# Patient Record
Sex: Female | Born: 1940
Health system: Southern US, Community
[De-identification: ages and names within clinical notes are randomized; demographics above are authoritative.]

## PROBLEM LIST (undated history)

## (undated) ENCOUNTER — Emergency Department (HOSPITAL_COMMUNITY): Payer: Medicare Other | Source: Home / Self Care

## (undated) DIAGNOSIS — J189 Pneumonia, unspecified organism: Secondary | ICD-10-CM

## (undated) DIAGNOSIS — M8580 Other specified disorders of bone density and structure, unspecified site: Secondary | ICD-10-CM

## (undated) DIAGNOSIS — C50919 Malignant neoplasm of unspecified site of unspecified female breast: Secondary | ICD-10-CM

## (undated) DIAGNOSIS — Z87898 Personal history of other specified conditions: Secondary | ICD-10-CM

## (undated) DIAGNOSIS — E785 Hyperlipidemia, unspecified: Secondary | ICD-10-CM

## (undated) DIAGNOSIS — IMO0002 Reserved for concepts with insufficient information to code with codable children: Secondary | ICD-10-CM

## (undated) DIAGNOSIS — K219 Gastro-esophageal reflux disease without esophagitis: Secondary | ICD-10-CM

## (undated) DIAGNOSIS — F419 Anxiety disorder, unspecified: Secondary | ICD-10-CM

## (undated) DIAGNOSIS — I712 Thoracic aortic aneurysm, without rupture, unspecified: Secondary | ICD-10-CM

## (undated) DIAGNOSIS — IMO0001 Reserved for inherently not codable concepts without codable children: Secondary | ICD-10-CM

## (undated) DIAGNOSIS — I10 Essential (primary) hypertension: Secondary | ICD-10-CM

## (undated) DIAGNOSIS — J40 Bronchitis, not specified as acute or chronic: Secondary | ICD-10-CM

## (undated) DIAGNOSIS — F32A Depression, unspecified: Secondary | ICD-10-CM

## (undated) DIAGNOSIS — M1990 Unspecified osteoarthritis, unspecified site: Secondary | ICD-10-CM

## (undated) DIAGNOSIS — F329 Major depressive disorder, single episode, unspecified: Secondary | ICD-10-CM

## (undated) DIAGNOSIS — M810 Age-related osteoporosis without current pathological fracture: Secondary | ICD-10-CM

## (undated) DIAGNOSIS — E7800 Pure hypercholesterolemia, unspecified: Secondary | ICD-10-CM

## (undated) DIAGNOSIS — I2510 Atherosclerotic heart disease of native coronary artery without angina pectoris: Secondary | ICD-10-CM

## (undated) DIAGNOSIS — E010 Iodine-deficiency related diffuse (endemic) goiter: Secondary | ICD-10-CM

## (undated) HISTORY — DX: Hyperlipidemia, unspecified: E78.5

## (undated) HISTORY — DX: Other specified disorders of bone density and structure, unspecified site: M85.80

## (undated) HISTORY — PX: ABDOMINAL HYSTERECTOMY: SHX81

## (undated) HISTORY — PX: CATARACT EXTRACTION: SUR2

## (undated) HISTORY — DX: Age-related osteoporosis without current pathological fracture: M81.0

## (undated) HISTORY — PX: BREAST BIOPSY: SHX20

## (undated) HISTORY — DX: Personal history of other specified conditions: Z87.898

## (undated) HISTORY — PX: CERVICAL SPINE SURGERY: SHX589

---

## 1997-08-16 ENCOUNTER — Emergency Department (HOSPITAL_COMMUNITY): Admission: EM | Admit: 1997-08-16 | Discharge: 1997-08-16 | Payer: Self-pay | Admitting: Emergency Medicine

## 1997-12-29 ENCOUNTER — Other Ambulatory Visit: Admission: RE | Admit: 1997-12-29 | Discharge: 1997-12-29 | Payer: Self-pay | Admitting: Radiology

## 1998-01-10 ENCOUNTER — Other Ambulatory Visit: Admission: RE | Admit: 1998-01-10 | Discharge: 1998-01-10 | Payer: Self-pay | Admitting: Radiology

## 1998-05-14 HISTORY — PX: FRACTURE SURGERY: SHX138

## 1998-07-14 ENCOUNTER — Other Ambulatory Visit: Admission: RE | Admit: 1998-07-14 | Discharge: 1998-07-14 | Payer: Self-pay | Admitting: Obstetrics and Gynecology

## 1999-06-15 HISTORY — PX: CARDIAC CATHETERIZATION: SHX172

## 1999-07-10 ENCOUNTER — Ambulatory Visit (HOSPITAL_COMMUNITY): Admission: RE | Admit: 1999-07-10 | Discharge: 1999-07-10 | Payer: Self-pay | Admitting: Cardiology

## 1999-07-20 ENCOUNTER — Encounter: Admission: RE | Admit: 1999-07-20 | Discharge: 1999-07-20 | Payer: Self-pay | Admitting: Cardiology

## 1999-07-20 ENCOUNTER — Encounter: Payer: Self-pay | Admitting: Cardiology

## 1999-12-06 ENCOUNTER — Other Ambulatory Visit: Admission: RE | Admit: 1999-12-06 | Discharge: 1999-12-06 | Payer: Self-pay | Admitting: Obstetrics and Gynecology

## 2000-01-04 ENCOUNTER — Encounter: Payer: Self-pay | Admitting: Emergency Medicine

## 2000-01-04 ENCOUNTER — Inpatient Hospital Stay (HOSPITAL_COMMUNITY): Admission: EM | Admit: 2000-01-04 | Discharge: 2000-01-07 | Payer: Self-pay | Admitting: Emergency Medicine

## 2000-01-05 ENCOUNTER — Encounter: Payer: Self-pay | Admitting: Orthopaedic Surgery

## 2000-05-14 DIAGNOSIS — C50919 Malignant neoplasm of unspecified site of unspecified female breast: Secondary | ICD-10-CM

## 2000-05-14 HISTORY — DX: Malignant neoplasm of unspecified site of unspecified female breast: C50.919

## 2000-05-15 ENCOUNTER — Encounter: Payer: Self-pay | Admitting: Orthopedic Surgery

## 2000-05-15 ENCOUNTER — Encounter: Admission: RE | Admit: 2000-05-15 | Discharge: 2000-05-15 | Payer: Self-pay | Admitting: Orthopedic Surgery

## 2000-12-16 ENCOUNTER — Other Ambulatory Visit: Admission: RE | Admit: 2000-12-16 | Discharge: 2000-12-16 | Payer: Self-pay | Admitting: Obstetrics and Gynecology

## 2001-01-16 ENCOUNTER — Ambulatory Visit (HOSPITAL_COMMUNITY): Admission: RE | Admit: 2001-01-16 | Discharge: 2001-01-16 | Payer: Self-pay | Admitting: Cardiology

## 2001-01-16 ENCOUNTER — Encounter: Payer: Self-pay | Admitting: Cardiology

## 2001-09-12 ENCOUNTER — Emergency Department (HOSPITAL_COMMUNITY): Admission: EM | Admit: 2001-09-12 | Discharge: 2001-09-12 | Payer: Self-pay | Admitting: Emergency Medicine

## 2003-03-25 ENCOUNTER — Ambulatory Visit (HOSPITAL_COMMUNITY): Admission: RE | Admit: 2003-03-25 | Discharge: 2003-03-25 | Payer: Self-pay | Admitting: Gastroenterology

## 2004-01-14 ENCOUNTER — Ambulatory Visit (HOSPITAL_COMMUNITY): Admission: RE | Admit: 2004-01-14 | Discharge: 2004-01-14 | Payer: Self-pay | Admitting: Neurology

## 2004-09-11 DIAGNOSIS — Z87898 Personal history of other specified conditions: Secondary | ICD-10-CM

## 2004-09-11 HISTORY — DX: Personal history of other specified conditions: Z87.898

## 2004-10-06 ENCOUNTER — Encounter: Admission: RE | Admit: 2004-10-06 | Discharge: 2004-10-06 | Payer: Self-pay | Admitting: Family Medicine

## 2006-03-14 DIAGNOSIS — M810 Age-related osteoporosis without current pathological fracture: Secondary | ICD-10-CM

## 2006-03-14 HISTORY — DX: Age-related osteoporosis without current pathological fracture: M81.0

## 2008-03-02 ENCOUNTER — Observation Stay (HOSPITAL_COMMUNITY): Admission: EM | Admit: 2008-03-02 | Discharge: 2008-03-03 | Payer: Self-pay | Admitting: Emergency Medicine

## 2008-08-14 ENCOUNTER — Ambulatory Visit: Payer: Self-pay | Admitting: Diagnostic Radiology

## 2008-08-14 ENCOUNTER — Emergency Department (HOSPITAL_BASED_OUTPATIENT_CLINIC_OR_DEPARTMENT_OTHER): Admission: EM | Admit: 2008-08-14 | Discharge: 2008-08-14 | Payer: Self-pay | Admitting: Emergency Medicine

## 2009-07-19 ENCOUNTER — Encounter: Admission: RE | Admit: 2009-07-19 | Discharge: 2009-07-19 | Payer: Self-pay | Admitting: Family Medicine

## 2009-08-23 ENCOUNTER — Encounter: Admission: RE | Admit: 2009-08-23 | Discharge: 2009-08-23 | Payer: Self-pay | Admitting: Family Medicine

## 2010-04-08 ENCOUNTER — Encounter: Admission: RE | Admit: 2010-04-08 | Discharge: 2010-04-08 | Payer: Self-pay | Admitting: Interventional Cardiology

## 2010-09-26 NOTE — Consult Note (Signed)
NAMEMarland Kitchen  ORENA, CAVAZOS NO.:  [id number]   MEDICAL RECORD NO.:  [id number]          PATIENT TYPE:  OBV   LOCATION:  3702                         FACILITY:  [facility]   PHYSICIAN:  Jake Bathe, MD      DATE OF BIRTH:  01/23/1941   DATE OF CONSULTATION:  03/03/2008  DATE OF DISCHARGE:  03/03/2008                                 CONSULTATION   REQUESTING PHYSICIAN:  Ramiro Harvest, MD   PRIMARY CARDIOLOGIST:  Francisca December, MD   PRIMARY CARE PHYSICIAN:  Carola J. Gerri Spore, MD   REASON FOR CONSULTATION:  Ms. Stouffer is being seen at the request of  Dr. Janee Morn for the evaluation of chest pain.   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  The first type of chest discomfort was a reflux or  burning like pain, especially after eating, which was relieved earlier  this hospitalization with a GI cocktail.  The second type of chest  discomfort was a prickly type of sensation just above the xiphoid  process radiating to her back, which was rated as a 3-4/10 in intensity,  lasting a few minutes in duration.  This pain was nonexertional.  She is  able to traverse the stairs at her office without difficulty, without  any significant shortness of breath.   Given her history of ascending aortic aneurysm, a CT scan was performed,  which demonstrated no evidence of dissection.  Currently, she is chest  pain free.  She was given a nitroglycerin last night, which decreased  her blood pressure into the 70s and 80s and fluid resuscitation brought  her blood pressures back to normotensive range.   PAST MEDICAL HISTORY:  1. Thoracic ascending aortic aneurysm  increased from 4.0 cm to 4.3 x      4.2 cm on recent CTA, 4.0 cm was back in 2001.  2. Hypertension.  3. Anxiety.  4. GERD.   ALLERGIES:  No known drug allergies.   MEDICATIONS:  1. Aspirin 81 mg once a day.  2. Lopressor 12.5 mg twice a day.  3. Protonix 40 mg twice a day.  4. Maxzide once a day.   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  She has a son with a  cardiomyopathy and has defibrillator.   FAMILY HISTORY:  Her mother had cancer, but no early family history of  coronary artery disease.   REVIEW OF SYSTEMS:  She did have some mild nausea, but denies syncope,  bleeding, orthopnea, PND, or shortness of breath.  As stated above, all  other 12 review of systems negative.  She does have occasional chronic  lateral ankle edema, worse with age.   PHYSICAL EXAMINATION:  VITAL SIGNS:  Temperature  98, pulse 66,  respirations 16, blood pressure 99/64, and sating 98% on room air.  GENERAL:  Alert and oriented x3, in no acute distress.  Pleasant and  sitting in bed comfortable.  EYES:  Well-perfused conjunctivae.  EOMI.  No scleral icterus.  NECK:  Supple.  No lymphadenopathy.  No carotid bruits appreciated.  Normal carotid upstrokes.  CARDIOVASCULAR:  Regular rate and rhythm with no murmurs, rubs, or  gallops.  I do not appreciate a diastolic murmur.  Normal PMI.  LUNGS:  Clear to auscultation bilaterally.  No wheezes.  No rales.  Normal respiratory effort.  ABDOMEN:  Soft and nontender.  Normoactive bowel sounds.  No rebound.  No guarding.  EXTREMITIES:  Lateral ankle edema noted bilaterally, otherwise normal  distal pulses.  Normal capillary refill time.  SKIN:  Warm, dry, and intact.  No rashes present.  NEUROLOGIC:  Nonfocal.  No tremors.  PSYCH:  Normal affect.   DIAGNOSTIC DATA:  An ECG obtained shows sinus rhythm, rate 60 with no  other abnormalities.  Prior ECG from this hospitalization  shows no  significant change.  Chest CT personally viewed shows no evidence of  dissection of the aorta or pulmonary embolism.  She has aneurysmal  dilatation of ascending thoracic aorta up to 4.3 x 4.2 cm in diameter.  She also has a right thyroid nodule 2.1 cm in size containing minimal  calcification, which was recommended for non-emergent thyroid  sonography.  She has some compression deformity of the lower thoracic  vertebra.   LABORATORIES:  White count 6.8, hemoglobin 14.3, hematocrit 41.9, and  platelets 259.  Sodium 140, potassium 3.9, BUN 13, creatinine 0.8, and  glucose 96.  Cardiac biomarkers x3 are normal.   ASSESSMENT AND PLAN:  A 70 year old female with chest pain, thoracic  aortic aneurysm, hypertension, and gastroesophageal reflux disease.  1. Chest pain - reassuring cardiac biomarkers and ECG.  No evidence of      acute coronary syndrome.  Chest discomfort as described above in      HPI, is quite atypical; however, she does have evidence of moderate      coronary artery disease from prior cardiac catheterization in 2001.      Given the fact that she is currently at low risk, given no evidence      of ECG or cardiac biomarker changes, she will be obtaining a stress      Cardiolite at 8:30 in the morning on March 05, 2008, at [facility]      [facility]iology.  It may not be unreasonable also to obtain an      echocardiogram to ensure that there is no significant changes at a      late.  Nevertheless, we will continue her aspirin, beta-blocker,      and Maxzide for hypertension control.  2. Hypertension - as above.  Continue current meds.  3. Anxiety - stable.  4. Thoracic ascending aortic aneurysm - 4.3 cm at its maximum      diameter.  I will continue to monitor this.  She does not meet      criteria at this point for repair.      Jake Bathe, MD  Electronically Signed     MCS/MEDQ  D:  03/03/2008  T:  03/04/2008  Job:  [id number]   cc:   Ramiro Harvest, MD  Francisca December, M.D.

## 2010-09-26 NOTE — Discharge Summary (Signed)
NAMEMarland Kane  JUANNA, PUDLO NO.:  [id number]   MEDICAL RECORD NO.:  [id number]          PATIENT TYPE:  OBV   LOCATION:  3702                         FACILITY:  [facility]   PHYSICIAN:  Ramiro Harvest, MD    DATE OF BIRTH:  01/08/1941   DATE OF ADMISSION:  03/02/2008  DATE OF DISCHARGE:  03/03/2008                               DISCHARGE SUMMARY   PRIMARY CARE PHYSICIAN:  Carola J. Gerri Spore, MD, of [facility]   CARDIOLOGIST:  Francisca December, MD, of [facility]   DISCHARGE DIAGNOSES:  1. Atypical chest pain, likely secondary to gastroesophageal reflux      disease.  2. Gastroesophageal reflux disease.  3. Thoracic aortic aneurysm.  4. Hypertension.  5. Nonobstructive coronary artery disease per catheterization of 2001.  6. Anxiety.  7. Hyperlipidemia.  8. Incidental thyroid nodule.   DISCHARGE MEDICATIONS:  1. Aspirin 81 mg p.o. daily.  2. Alprazolam 0.5 mg p.o. p.r.n.  3. Calcium D daily.  4. Metoprolol--continue at previous home dose, dose unknown on      admission.  5. Triamterene/hydrochlorothiazide 37.5/25 mg p.o. daily.  Resume in 3      days postdischarge.  Start on March 06, 2008.  6. Protonix 40 mg 1 tab p.o. b.i.d.   DISPOSITION AND FOLLOWUP:  The patient will be discharged home.  The  patient is to follow up with Dr. Amil Amen with [facility] for a  Cardiolite stress test on March 05, 2008, at 8:30 a.m.  The patient is  also to follow up with PCP in 2 weeks to reassess patient's GERD, also  reassess patient's blood pressure.  Patient will need a fasting lipid  panel to reassess her hyperlipidemia.  Patient will also need a thyroid  sonography to follow up on the nodule that was incidentally found on  imaging during this hospitalization.  Patient will be called by Dr.  Adele Dan office of [facility] of the vascular section to have patient be seen  to follow up on her thoracic aneurysm.   PROCEDURES PERFORMED:  1. A chest x-ray was  performed on March 02, 2008, that showed no      active disease.  2. An abdominal ultrasound was performed on March 02, 2008, that      showed abdominal aorta and iliac arteries normal, normal kidney      size without obstruction.  3. CT angiography of the chest was done March 02, 2008, that showed      no evidence of aortic dissection or pulmonary embolism, aneurysmal      dilatation of the ascending thoracic aorta up to 4.3 x 4.2 cm      diameter, right thyroid nodule 2.1 cm greatest size containing      minimal calcification; recommend followup non-emergent thyroid      sonography to evaluate, old-appearing compression deformity of a      lower thoracic vertebra.   CONSULTATIONS:  Patient was seen in consultation by Dr. Donato Schultz of  [facility] on March 03, 2008.   BRIEF HOSPITAL ADMISSION HISTORY AND PHYSICAL:  Mrs. Tiffany Kane is  a 70 year old white  female with past medical history of enlarged  ascending thoracic aortic aneurysm last followed in 2001 at 3.9 cm.  Patient also has a history of hypertension, had been doing well, but  over the past week, she had been complaining of some very atypical chest  pain.  Patient describes it as midsternal, more of a burning sensation,  has been taking Tums and Rolaids which she states have given some  partial relief.  Patient has also had some nausea and vomiting with this  but no associated shortness of breath.  Patient became concerned when  symptoms were not improving and came to the ED for further evaluation.  In the emergency room, EKG done noted a normal sinus rhythm.  Cardiac  markers and chest x-ray were unremarkable.  Initially, she had told the  physician she had a history of an abdominal aortic aneurysm, noted  thoracic aneurysm, and as such, an ultrasound of abdomen was ordered  which was completely normal.  Patient herself is currently doing well.  Patient denied any headaches, no visual changes, no  dysphagia, no chest  pain, no palpitations, no shortness of breath, no wheeze, no cough, no  abdominal pain, no hematuria, no dysuria, no constipation, no diarrhea,  no focal extremity numbness, weakness or pain.   PHYSICAL EXAMINATION:  By admitting physician.  VITAL SIGNS:  Temperature 98.2, pulse of 78, blood pressure 166/95 down  to 136/75, respirations 12, O2 saturations of 98% on room air.  GENERAL:  Patient is alert and oriented x3, in no apparent distress.  HEENT:  Normocephalic, atraumatic.  Mucous membranes are moist, no  carotid bruits.  CARDIOVASCULAR:  Regular rate and rhythm, S1, S2, no murmurs, rubs, or  gallops.  RESPIRATIONS:  Lungs are clear to auscultation bilaterally.  ABDOMEN:  Soft, nontender, nondistended, positive bowel sounds.  EXTREMITIES:  No clubbing, cyanosis or edema.   LABORATORIES ON ADMISSION:  CBC:  White count 6.8, hemoglobin 14.3,  hematocrit 41.9, platelets of 259, ANC of 3.7.  Point-of-care cardiac  markers:  CK-MB 1.6, troponin-I less than 0.05, myoglobin of 90.6, i-  STAT 8 with a sodium of 140, potassium 3.9, chloride 106, glucose 96,  BUN 13, creatinine 0.8, hemoglobin and hematocrit of 14.3/42  respectively, ionized calcium of 1.19.  Chest x-ray as stated above with  no active disease.  EKG with normal sinus rhythm.  Abdominal ultrasound  is as stated above.   HOSPITAL COURSE:  1. Atypical chest pain.  The patient was admitted for chest pain, rule      out myocardial infarction.  Patient's atypical chest pain was felt      to be likely secondary to gastroesophageal reflux disease.  Due to      patient's multiple cardiac issues of hyperlipidemia, hypertension,      family history of coronary artery disease, history of single-vessel      nonobstructing coronary artery disease, it was felt patient be      brought in and ruled out for MI.  Cardiac enzymes were cycled which      came back negative.  EKG was without any changes.  Patient       described her atypical chest pain as more burning in sensation with      some associated brash.  Patient was placed on Protonix 40 mg daily,      was also given a GI cocktail with some mild relief in her symptoms.      During her initial hospitalization during the night  of March 02, 2008, patient did have some more chest pain and was given some      nitroglycerin with a drop in her systolic blood pressure.  This was      thought to be secondary to her nitroglycerin.  Patient did not have      any relief with the nitroglycerin.  Patient's blood pressure      medications were held, and patient was fluid resuscitated; patient      remained in stable and improved condition.  Cardiology was      consulted.  Patient was seen in consultation by Dr. Donato Schultz of      [facility] who felt patient's chest pain was atypical, felt      patient was at low risk and felt that patient would benefit from      outpatient Cardiolite which is scheduled for March 05, 2008, at      8:30 a.m.  Patient remained stable throughout the hospitalization,      and patient will be discharged in stable and improved condition.  2. Thoracic aortic aneurysm as noted per CT.  This did have increased      diameter of 4.3 x 4.2 as compared to 3.9 in 2001.  Patient remained      stable.  There was no evidence of dissection per CT angio.  I      discussed patient's thoracic aneurysm with Dr. Edilia Bo of [facility],      vascular surgery section.  Will have his office call patient for an      appointment to follow up on her thoracic aortic aneurysm.  3. Thyroid nodule.  Thyroid nodule was noted incidentally on CT      angiogram of the chest.  This will need to be followed up as an      outpatient.  Patient remained asymptomatic throughout the      hospitalization.  The rest of patient's chronic medical issues      remained stable throughout the hospitalization, and patient will be      discharged in stable and improved  condition.   On day of discharge, vital signs revealed temperature of 97.5, pulse of  80, blood pressure 123/76, respirations 18, saturating 100% on room air.   It was a pleasure taking care of Mrs. Charm Barges.      Ramiro Harvest, MD  Electronically Signed     DT/MEDQ  D:  03/03/2008  T:  03/03/2008  Job:  [id number]   cc:   Otilio Connors. Gerri Spore, M.D.  Francisca December, M.D.  Jake Bathe, MD  Di Kindle. Edilia Bo, M.D.

## 2010-09-26 NOTE — H&P (Signed)
Tiffany Kane, Tiffany Kane NO.:  [id number]   MEDICAL RECORD NO.:  [id number]          PATIENT TYPE:  EMS   LOCATION:  MAJO                         FACILITY:  [facility]   PHYSICIAN:  Hollice Espy, M.D.DATE OF BIRTH:  08-13-1940   DATE OF ADMISSION:  03/02/2008  DATE OF DISCHARGE:                              HISTORY & PHYSICAL   PRIMARY CARE Drelyn Pistilli:  Dr. Carolin Coy.   CHIEF COMPLAINT:  Chest pain.   HISTORY OF PRESENT ILLNESS:  The patient is a 70 year old white female  with a past medical history of enlarged ascending thoracic aortic  aneurysm last followed in 2001 at 3.9 cm.  She also has a history of  hypertension.  She has been doing well, but over the past week, she is  complaining of some very atypical chest pain.  She describes it as  midsternal, more of a burning, and she has been taking Tums and Rolaids,  which she says has given her some partial relief.  She has also had some  nausea and vomiting with this, but no associated shortness of breath.  She became concerned when symptoms were not improving, and came to the  emergency room for further evaluation.  In the emergency room, her EKG  noted a normal sinus rhythm.  Her cardiac markers and chest x-ray were  unremarkable.  Initially, she had told the physician she had a history  of an AAA, and not a thoracic aneurysm, and they ordered an ultrasound  of her abdomen, which was completely normal.  The patient, herself, is  currently doing well.  She denies any headaches, vision changes,  dysphagia, chest pain, palpitations, shortness of breath, wheeze, cough,  abdominal pain, hematuria, dysuria, constipation, diarrhea, focal  extremity numbness, weakness, or pain.   REVIEW OF SYSTEMS:  Otherwise, negative.   PAST MEDICAL HISTORY:  Includes hypertension, anxiety, and enlarged  thoracic aortic aneurysm.   MEDICATIONS:  1. The patient on metoprolol.  She cannot remember the dose, but she  takes a low dose daily.  2. She is on triamterine 37.5/hydrochlorothiazide 25 p.o. daily.  3. She also takes aspirin 81 p.o. daily.   She has no known drug allergies.   SOCIAL HISTORY:  Denies any tobacco, alcohol, or drug use.   FAMILY HISTORY:  Noncontributory.   PHYSICAL EXAM:  VITAL SIGNS:  Temperature 98.2, heart rate 78, blood  pressure initially 166/95, now down to 136/75, respirations 12, O2  saturation 98% on room air.  GENERAL:  She is alert and oriented x3.  In no apparent distress.  HEENT:  Normocephalic, atraumatic.  Mucous membranes are moist.  She has  no carotid bruits.  HEART:  Regular rate and rhythm.  S1, S2.  No murmurs.  LUNGS:  Clear to auscultation bilaterally.  ABDOMEN:  Soft, nontender, nondistended.  Positive bowel sounds.  EXTREMITIES:  No cyanosis, clubbing, or edema.   LAB WORK:  CBC 90.6, MB 1.6, troponin I less than 0.05, white count 6.8,  H and H 14.3 and 42, MCV 90, platelet count 259,000.  No shift.  Sodium  140, potassium 3.9, chloride 106, BUN  13, creatinine 0.8, glucose 96.  Ultrasound of the aorta, abdominal aorta, and iliac arteries normal.  Normal kidney size without obstruction.  Chest x-ray shows no active  disease.  EKG shows normal sinus rhythm.   ASSESSMENT AND PLAN:  1. Chest pain, atypical, likely gastroesophageal reflux disease, but      will check enzymes x3.  If they are negative, we will plan to      discharge home with followup with [facility] as an outpatient      for outpatient stress test.  2. History of dilated thoracic aortic aneurysm.  Check a CT scan of      the chest with contrast.  3. Hypertension.  Continue meds.      Hollice Espy, M.D.  Electronically Signed     SKK/MEDQ  D:  03/02/2008  T:  03/02/2008  Job:  [id number]   cc:   Otilio Connors. Gerri Spore, M.D.

## 2010-09-29 NOTE — Op Note (Signed)
   NAME:  Tiffany Kane, Tiffany Kane                      ACCOUNT NO.:  [id number]   MEDICAL RECORD NO.:  [id number]                   PATIENT TYPE:  AMB   LOCATION:  ENDO                                 FACILITY:  [facility]   PHYSICIAN:  Graylin Shiver, M.D.                DATE OF BIRTH:  [birth year]   DATE OF PROCEDURE:  03/25/2003  DATE OF DISCHARGE:                                 OPERATIVE REPORT   PROCEDURE:  Colonoscopy.   INDICATIONS FOR PROCEDURE:  Rectal bleeding.   PROCEDURE:  Informed consent was obtained after explanation of ithe risks of  bleeding, infection and perforation.   PREMEDICATION:  Fentanyl 100 mcg IV and Versed 10 mg IV.   With the patient in the left lateral decubitus position a rectal exam was  performed and no masses were felt.  The Olympus colonoscope was inserted  into the rectum and advanced around the colon to the cecum.  The cecal  landmarks were identified.  The cecum and ascending colon were normal.  The  transverse colon was normal.  The descending colon and sigmoid revealed some  scattered diverticula.  The rectum was normal.  She tolerated the procedure  well without complications.   IMPRESSION:  Diverticulosis of the left colon.                                               Graylin Shiver, M.D.    SFG/MEDQ  D:  03/25/2003  T:  03/25/2003  Job:  [id number]   cc:   Otilio Connors. Gerri Spore, M.D.  [address]  [place]  [place] [address]  Fax: [phone]

## 2010-09-29 NOTE — Cardiovascular Report (Signed)
[place]  Patient:    Tiffany Kane, Tiffany Kane                     MRN: [id number] Proc. Date: 07/10/99 Adm. Date:  [id number] Attending:  Corliss Marcus CC:         Carola J. Gerri Spore, M.D.             Cardiac Catheterization Lab                        Cardiac Catheterization  CINE NO. [id number]  PROCEDURES PERFORMED: 1. Left heart catheterization. 2. Coronary angiography. 3. Left ventriculogram.  INDICATIONS:  Ms. Dinkel is a 70 year old with known nonobstructive coronary artery disease dating from a heart catheterization done in 1996.  She has recently developed an atypical anginal syndrome.  An exercise treadmill test was borderline positive for electrocardiographic changes indicative of ischemia.  No angina is  reproduced.  She is brought to the catheterization laboratory to evaluate for possible progression of her LAD stenosis versus other significant coronary disease.  PROCEDURAL NOTE:  Left heart catheterization was performed following the percutaneous insertion of a 5-French catheter sheath utilizing an anterior approach over a guiding J wire into the right femoral artery.  The patient then received  1500 units of heparin.  A 110-cm pigtail catheter was advanced to the ascending  aorta where the pressure was recorded.  The catheter was then prolapsed across he aortic valve, and the pressure was again recorded in the left ventricle both prior to and following the ventriculogram.  A 30-degree RAO cine left ventriculogram as performed using a power injector at 45 cc of Omnipaque contrast material injected at 13 cc per second.  Coronary angiography was then performed using 5-French #4  right and left Judkins catheters.  Cineangiography of each coronary artery was conducted in multiple LAO and RAO projections.  All catheter manipulations were  performed using fluoroscopic observation, and exchanges were performed over  a long guiding J wire.  At the completion of the procedure, the catheter and catheter sheaths were removed. Hemostasis was achieved by direct pressure.  The patient was transported to the  recovery area in stable condition with intact distal pulses.  Fluoroscopy time as three minutes.  Total contrast utilized was 120 cc of Omnipaque.  HEMODYNAMICS:  Systemic arterial pressure was 144/77 with a mean of 106 mmHg. he left ventricular end diastolic pressure was 12 mmHg pre-ventriculogram and unchanged post-ventriculogram.  There was no systolic gradient across the aortic valve.  ANGIOGRAPHY:  The left ventriculogram demonstrated normal left ventricular size and global systolic function without regional wall motion abnormality.  There was no mitral regurgitation.  There was left coronary calcification and aortic root calcification seen.  The calculated ejection fraction utilizing a single plane cine method was 75%.  There was a right dominant coronary system present. 1. The main left coronary artery was normal. 2. The left anterior descending artery and its branches were moderately diseased;    atherosclerosis can be seen involving the proximal and mid portion of the    vessel and achieves a maximum stenosis of 35-40% just after the first septal    perforator.  The remainder of the vessel is without significant obstruction.    There are several small diagonal branches which arise, none of which have any    significant disease. 3. The left circumflex artery and its branches were normal. 4. The right coronary artery and  its branches were normal.  Collateral vessels are not seen.  FINAL IMPRESSION: 1. Nonobstructive single-vessel ASCVD. 2. History of mildly dilated ascending aorta.  PLAN: 1. Will obtain a CT scan of the chest to assess for any possible progression in the    size of her ascending aortic aneurysm. 2. Will consider additional medical therapy for treatment of  her chest discomfort,    some of which does rarely occur with exertion. DD:  07/10/99 TD:  07/10/99 Job: [id number]

## 2010-09-29 NOTE — Op Note (Signed)
[place]. [facility]  Patient:    Tiffany Kane, Tiffany Kane                     MRN: [id number] Proc. Date: 01/04/00 Adm. Date:  [id number] Disc. Date: [id number] Attending:  Randolm Idol                           Operative Report  ASSISTANT:  Arnoldo Morale, P.A.  PREOPERATIVE DIAGNOSES: 1. Displaced right distal fibular fracture. 2. Displaced right distal tibia fracture.  POSTOPERATIVE DIAGNOSES: 1. Displaced right distal fibular fracture. 2. Displaced right distal tibia fracture.  OPERATION PERFORMED:  Open reduction and internal fixation.  ANESTHESIA:  General endotracheal.  COMPLICATIONS:  None.  DESCRIPTION OF PROCEDURE:  With the patient comfortable on the operating table under general endotracheal anesthesia, the right lower extremity was placed and applied tourniquet.  The leg was prepped with Betadine scrub and then Duraprep from the tips of the toes to both the knees.  Sterile draping was performed.  With extremity still elevated, it was Esmarch exsanguinated with the proximal tourniquet at 350 mmHg.  Preoperative films revealed a displaced, short, oblique distal fibular fracture associated with a distal tibia fracture, i.e., a pilon type, without evidence of a medial malleolus fracture.  There was an anterior/posterior translation of the distal tibia.  A longitudinal incision was made along the fibula and by sharp dissection, it was carried through the subcutaneous tissue.  Then, by blunt dissection, the soft tissue was elevated off the distal fibula.  The fracture was identified. The proximal fragment was anterior to the distal fragment and considerably shortened.  Under direct visualization, and with some difficulty, the fracture was reduced and maintained with a bone clamp.  X-rays revealed excellent position of that particular fracture in re-established tibial plane.  There was, however, a small intra-articular fragment of the lateral  distal tibia that was still displaced and this was palpable through the lateral incision.  A Synthes 7-hole one-third tubular plate was then bent to conform with the distal fibula and fixed with four proximal cortical screws and three distal cancellous screws after appropriate drilling and measuring.  Then, by blunt dissection, I elevated the soft tissue off the lateral distal tibial fragment.  It was probably 1.5 cm by 2 cm and was displaced and it did represent an intra-articular fragment.  Under direct visualization, it was reduced to maintain with a K-wire.  Image intensification revealed excellent position.  I measured the K-wire and drilled over the K-wire, inserted the Ace cannulator 3.5 screw with excellent fixation of the fragment.  There was no motion.  I checked the joint surface and felt that it was basically anatomic.  The anterior/posterior tibia fracture appeared to be stable, but to secure position, I inserted an anterior-to-posterior screw through a separate stab wound, approximately 8 cm, anterior and medial to the fibular incision.  By lunt dissection, the soft tissue was elevated off the periosteum.  Retractors were inserted.  K-wire was inserted and checked to be in good position with AP and lateral projections.  It was overdrilled and then filled with the Ace titanium cannulated screw with excellent fixation.  It was nice and tight. The fracture was again checked with intensification and it was anatomic in both AP and lateral projections.  The wounds were irrigated with saline antibiotic solution, then closed in several layers laterally and just skin clips anteriorly, as it was a very  small incision.  A sterile bulky dressing was applied, followed by posterior splint and Ace bandage.  The tourniquet was deflated with immediate capillary refill to the toes.  The patient tolerated the procedure without complications. DD:  01/05/00 TD:  01/06/00 Job:  [id number]

## 2010-09-29 NOTE — Discharge Summary (Signed)
[place]. [facility]  Patient:    Tiffany Kane, Tiffany Kane                     MRN: [id number] Adm. Date:  [id number] Disc. Date: [id number] Attending:  Randolm Idol                           Discharge Summary  FINAL DIAGNOSES: 1. Displaced right distal fibula fracture. 2. Displaced right distal tibia fracture with intra-articular extension. 3. History of hypercholesterolemia. 4. Gastroesophageal reflux disease. 5. History of cardiac catheterization with 75% ejection fraction.  PROCEDURES:  On January 04, 2000, open reduction internal fixation of right distal fibula and tibia fracture.  COMPLICATIONS:  None.  HISTORY:  This 70 year old female fell after twisting her right ankle while stepping down on a concrete step on the evening of admission.  She was brought to the emergency room, with x-rays demonstrating a displaced distal tibia fracture with intra-articular extension associated with a displaced distal fibula fracture.  She was taken to the operating room that evening, where she underwent open reduction internal fixation of both the tibia and fibula fractures.  The reduction seemed to be anatomic.  Technically, the procedure went quite well.  She was placed in a posterior splint.  Postoperative day #1, she was afebrile, awake, and alert.  She was comfortable with minimal use of analgesics.  Neurovascular exam was intact to the right foot.  Physical therapy was initiated and at the time of discharge, she was progressing to the point where she was relatively comfortable with crutches, ______ difficulty going up and down stairs.  She preferred to use a walker and this was subsequently provided.  She was discharged on postoperative day #3, tolerating a regular diet, voiding without difficulty.  She was to receive home health therapy.  She has a three-in-one chair and the walker.  She is to return to the office in a week for reevaluation and removal of  her staples.  She was tolerating a regular diet and was comfortable and fairly independent, both in and out of bed.  DISCHARGE MEDICATIONS:  Were to include her home medications of Prilosec 20 mg a day, Lipitor 10 mg a day, and Evista 60 mg a day.  Percocet 5 mg 1-2 tablets every three hours p.r.n.  LABORATORY STUDIES:  Include an EKG with normal sinus rhythm.  Chest x-ray show lungs to be clear, the heart size was within normal limits. There were surgical clips identified in the left paratracheal region.  There is no evidence of any bony abnormality.  Preoperative hemoglobin was 13.3, hematocrit 37.8.  Postoperatively was 11.9 and 35.1.  Sodium was 137, potassium 4.8, chloride 102, CO2 26, glucose 107, BUN 16, creatinine 0.7, calcium 9, total protein 6.6, albumin 4.0, AST 44, ALT 18, ALP 53, and total bilirubin was 1.1.  A urine performed on August 23 revealed moderate leukocyte esterase, rare epithelial cells, 11-20 wbcs, and 0-5 rbcs, rare bacteria.  A repeat was performed on January 05, 2000 and was essentially normal, except for a trace of leukocyte esterase, 0-5 wbcs, and 0-5 rbcs with no bacteria identified.  C&S was also performed on August 24.  There were multiple species present and it was felt that this probably was ______ and there were 25,000 colonies. DD:  03/03/00 TD:  03/04/00 Job: [id number]

## 2010-09-29 NOTE — H&P (Signed)
[place]. [facility]  Patient:    SALEM, MASTROGIOVANNI                     MRN: [id number] Adm. Date:  [id number] Attending:  Corliss Marcus Dictator:   Anselm Lis, N.P. CC:         Carola J. Gerri Spore, M.D.                         History and Physical  HISTORY OF PRESENT ILLNESS:  Ms. Heffner is a 70 year old with known single-vessel coronary artery disease (40%-60% proximal/mid-LAD by heart catheterization in September 1996), who has been experiencing nonexertional mid-substernal chest pain/pressure, which radiates through to her back.  She is status post a follow-up treadmill test (often), which was borderline electrocardiographically for ischemia. She now presents for a repeat cardiac catheterization with possible percutaneous intervention if indicated and able.  PAST MEDICAL HISTORY: 1. Single-vessel coronary artery disease:    a. In September 1996, a 40%-60% proximal to mid-LAD, intact LV size       and function.    b. (February 2001), borderline electrocardiographically office stress       treadmill. 2. 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 3. History of chest discomfort, responsive to Prilosec. 4. 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 5. Dyslipidemia, on Pravachol. 6. Hysterectomy without BSO. 7. In 1999, a lumpectomy for breast cancer, which was cured.  The patient denies a history of hypertension, diabetes mellitus, or thyroid disease.  ALLERGIES:  DEMEROL causing breathing problems, though she subsequently had Demerol with Phenergan, without problems.  CURRENT MEDICATIONS: 1. Enteric-coated aspirin 325 mg q.d. 2. Pravachol 20 mg p.o. q.d. 3. Evista once q.d. 4. Prilosec 20 mg q.d.  SOCIAL HISTORY:  HABITS:  Tobacco:  Quit six years earlier, prior a 15-year total of less or equal to one pack per day.  ETOH:  Negative.   Caffeine:  Negative. he patient works as a Psychologist, forensic.  She has been widowed approximately seven years.  She has two sons alive and well.  FAMILY HISTORY:  Mother without coronary artery disease, and is age 19.  Father  deceased at age 11 of bone cancer.  She has a brother who had a heart attack at age 13, and a sister who is alive and well.  REVIEW OF SYSTEMS:  As in the HPI and the past medical history.  Otherwise denies problems with near syncopal episodes.  Has rare episodic lightheadedness.  Rare  tinnitus.  Good dentition.  Negative dysphagia to food or fluid.  Positive symptoms of gastroesophageal reflux disease, experiences epigastric, lower substernal "hurting" radiating to her back.  Denies melena, constipation, diarrhea, or black or tarry-looking stools.  Negative dysuria or hematuria.  No arthritic-type complaints.  Negative palpitations.  Denies pedal edema, orthopnea, or PND. Negative DOE.  PHYSICAL EXAMINATION:  VITAL SIGNS:  Blood pressure 131/72, heart rate 72 and regular, respirations 14, temperature 98.3 degrees.  GENERAL:  She is a well-nourished older female, in no apparent distress.  Her mother is in attendance.  HEENT/NECK:  Brisk bilateral carotid upstroke without bruits.  No significant jugular venous distention or thyromegaly.  CHEST:  Lung sounds clear with equal bilateral excursion.  Negative CPA tenderness.  CARDIAC:  A regular rate and rhythm without murmur, rub, or gallop.  Normal S1,  S2.  ABDOMEN:  Soft, nondistended, normoactive bowel sounds.  Negative abdominal aorta, renal, or femoral bruits.  No masses, no organomegaly, nontender.  EXTREMITIES:  With +2/4 bilateral radial, femoral, and dorsalis pedis pulses. Negative pedal edema.  NEUROLOGIC:  Cranial nerves II-XII grossly intact.  Alert and oriented x 3.  GENITOURINARY:  Deferred.  RECTAL:  Deferred.  LABORATORY DATA:  From July 04, 1999, sodium 142, K of 4.1,  chloride 103, CO2 of 29, BUN 13, creatinine 0.8, glucose 88.  CBC reveals a hemoglobin of 14.5, WBC 4.7, platelets 244.  Pro time 11.8, INR 1.01, PTT 35.  Electrocardiogram reveals normal sinus rhythm at 79 beats per minute with T-wave inversion in aVL.  No acute changes.  IMPRESSION: 1. Atypical chest pain in a 70 year old with known single-vessel coronary artery    disease, moderate left anterior descending coronary artery stenosis by a    heart catheterization five years earlier.  Follow-up office treadmill was    electrocardiographically suspicious for ischemia.  She now presents for a    heart catheterization, to define the coronary anatomy. 2. Dyslipidemia, on Pravachol. 3. Dilated ascending aortic artery, stable by a CAT scan in 1998. 4. History of gastroesophageal reflux disease.  PLAN:  A cardiac catheterization with possible percutaneous intervention if indicated and able.  The risks, potential complications, benefits, and alternatives of the procedure  were discussed in detail.  Ms. Auzenne indicates that her questions and concerns have been addressed and wishes to proceed.DD:  07/10/99 TD:  07/10/99 Job: [id number]

## 2011-02-13 LAB — POCT I-STAT, CHEM 8
BUN: 13
Calcium, Ion: 1.19
Creatinine, Ser: 0.8
Hemoglobin: 14.3
Sodium: 140
TCO2: 26

## 2011-02-13 LAB — CARDIAC PANEL(CRET KIN+CKTOT+MB+TROPI)
CK, MB: 1.4
Relative Index: INVALID
Relative Index: INVALID
Troponin I: <0.01
Troponin I: <0.01

## 2011-02-13 LAB — DIFFERENTIAL
Basophils Absolute: 0
Lymphocytes Relative: 35
Monocytes Absolute: 0.4
Monocytes Relative: 6
Neutro Abs: 3.7

## 2011-02-13 LAB — CBC
MCV: 89.6
RBC: 4.68
WBC: 6.8

## 2011-02-13 LAB — POCT CARDIAC MARKERS
CKMB, poc: 1.6
Myoglobin, poc: 90.6

## 2011-04-10 ENCOUNTER — Other Ambulatory Visit: Payer: Self-pay | Admitting: Interventional Cardiology

## 2011-04-10 DIAGNOSIS — I712 Thoracic aortic aneurysm, without rupture: Secondary | ICD-10-CM

## 2011-04-17 ENCOUNTER — Ambulatory Visit
Admission: RE | Admit: 2011-04-17 | Discharge: 2011-04-17 | Disposition: A | Discharge status: Home / Self Care | Payer: Medicare Other | Source: Ambulatory Visit | Attending: Interventional Cardiology | Admitting: Interventional Cardiology

## 2011-04-17 DIAGNOSIS — I712 Thoracic aortic aneurysm, without rupture: Secondary | ICD-10-CM

## 2011-04-17 MED ORDER — GADOBENATE DIMEGLUMINE 529 MG/ML IV SOLN
14.0000 mL | Freq: Once | INTRAVENOUS | Status: AC | PRN
  Administered 2011-04-17: 14 mL via INTRAVENOUS

## 2011-04-20 ENCOUNTER — Other Ambulatory Visit: Payer: Self-pay | Admitting: Interventional Cardiology

## 2011-04-20 DIAGNOSIS — I712 Thoracic aortic aneurysm, without rupture: Secondary | ICD-10-CM

## 2011-04-27 ENCOUNTER — Other Ambulatory Visit: Payer: Self-pay | Admitting: Family Medicine

## 2011-04-27 DIAGNOSIS — E041 Nontoxic single thyroid nodule: Secondary | ICD-10-CM

## 2011-04-30 ENCOUNTER — Ambulatory Visit
Admission: RE | Admit: 2011-04-30 | Discharge: 2011-04-30 | Disposition: A | Discharge status: Home / Self Care | Payer: Medicare Other | Source: Ambulatory Visit | Attending: Family Medicine | Admitting: Family Medicine

## 2011-04-30 DIAGNOSIS — E041 Nontoxic single thyroid nodule: Secondary | ICD-10-CM

## 2011-05-03 ENCOUNTER — Other Ambulatory Visit: Payer: Self-pay | Admitting: Family Medicine

## 2011-05-03 DIAGNOSIS — E042 Nontoxic multinodular goiter: Secondary | ICD-10-CM

## 2011-05-16 ENCOUNTER — Other Ambulatory Visit (HOSPITAL_COMMUNITY)
Admission: RE | Admit: 2011-05-16 | Discharge: 2011-05-16 | Disposition: A | Discharge status: Home / Self Care | Payer: Medicare Other | Source: Ambulatory Visit | Attending: Interventional Radiology | Admitting: Interventional Radiology

## 2011-05-16 ENCOUNTER — Other Ambulatory Visit: Payer: Self-pay | Admitting: Interventional Radiology

## 2011-05-16 ENCOUNTER — Ambulatory Visit
Admission: RE | Admit: 2011-05-16 | Discharge: 2011-05-16 | Disposition: A | Discharge status: Home / Self Care | Payer: Medicare Other | Source: Ambulatory Visit | Attending: Family Medicine | Admitting: Family Medicine

## 2011-05-16 DIAGNOSIS — E049 Nontoxic goiter, unspecified: Secondary | ICD-10-CM | POA: Diagnosis not present

## 2011-05-16 DIAGNOSIS — E079 Disorder of thyroid, unspecified: Secondary | ICD-10-CM | POA: Diagnosis not present

## 2011-05-16 DIAGNOSIS — E042 Nontoxic multinodular goiter: Secondary | ICD-10-CM

## 2011-05-21 DIAGNOSIS — F411 Generalized anxiety disorder: Secondary | ICD-10-CM | POA: Diagnosis not present

## 2011-05-23 DIAGNOSIS — F411 Generalized anxiety disorder: Secondary | ICD-10-CM | POA: Diagnosis not present

## 2011-05-23 DIAGNOSIS — M25559 Pain in unspecified hip: Secondary | ICD-10-CM | POA: Diagnosis not present

## 2011-05-23 DIAGNOSIS — Z79899 Other long term (current) drug therapy: Secondary | ICD-10-CM | POA: Diagnosis not present

## 2011-05-23 DIAGNOSIS — E782 Mixed hyperlipidemia: Secondary | ICD-10-CM | POA: Diagnosis not present

## 2011-05-30 DIAGNOSIS — M545 Low back pain: Secondary | ICD-10-CM | POA: Diagnosis not present

## 2011-06-06 DIAGNOSIS — M545 Low back pain: Secondary | ICD-10-CM | POA: Diagnosis not present

## 2011-06-12 DIAGNOSIS — M545 Low back pain: Secondary | ICD-10-CM | POA: Diagnosis not present

## 2011-06-14 DIAGNOSIS — M545 Low back pain: Secondary | ICD-10-CM | POA: Diagnosis not present

## 2011-06-19 DIAGNOSIS — M545 Low back pain: Secondary | ICD-10-CM | POA: Diagnosis not present

## 2011-06-21 DIAGNOSIS — M545 Low back pain: Secondary | ICD-10-CM | POA: Diagnosis not present

## 2011-06-27 DIAGNOSIS — M545 Low back pain: Secondary | ICD-10-CM | POA: Diagnosis not present

## 2011-07-05 DIAGNOSIS — M545 Low back pain: Secondary | ICD-10-CM | POA: Diagnosis not present

## 2011-07-12 DIAGNOSIS — M545 Low back pain: Secondary | ICD-10-CM | POA: Diagnosis not present

## 2011-07-17 DIAGNOSIS — M545 Low back pain: Secondary | ICD-10-CM | POA: Diagnosis not present

## 2011-07-25 DIAGNOSIS — M545 Low back pain: Secondary | ICD-10-CM | POA: Diagnosis not present

## 2011-08-07 DIAGNOSIS — J0100 Acute maxillary sinusitis, unspecified: Secondary | ICD-10-CM | POA: Diagnosis not present

## 2011-08-07 DIAGNOSIS — M545 Low back pain: Secondary | ICD-10-CM | POA: Diagnosis not present

## 2011-08-17 DIAGNOSIS — Z961 Presence of intraocular lens: Secondary | ICD-10-CM | POA: Diagnosis not present

## 2011-08-17 DIAGNOSIS — Z0100 Encounter for examination of eyes and vision without abnormal findings: Secondary | ICD-10-CM | POA: Diagnosis not present

## 2011-08-17 DIAGNOSIS — H259 Unspecified age-related cataract: Secondary | ICD-10-CM | POA: Diagnosis not present

## 2011-08-17 DIAGNOSIS — G909 Disorder of the autonomic nervous system, unspecified: Secondary | ICD-10-CM | POA: Diagnosis not present

## 2011-09-13 ENCOUNTER — Other Ambulatory Visit: Payer: Self-pay | Admitting: Family Medicine

## 2011-09-13 ENCOUNTER — Ambulatory Visit
Admission: RE | Admit: 2011-09-13 | Discharge: 2011-09-13 | Disposition: A | Discharge status: Home / Self Care | Payer: Medicare Other | Source: Ambulatory Visit | Attending: Family Medicine | Admitting: Family Medicine

## 2011-09-13 DIAGNOSIS — R509 Fever, unspecified: Secondary | ICD-10-CM | POA: Diagnosis not present

## 2011-09-13 DIAGNOSIS — R062 Wheezing: Secondary | ICD-10-CM | POA: Diagnosis not present

## 2011-09-13 DIAGNOSIS — J189 Pneumonia, unspecified organism: Secondary | ICD-10-CM

## 2011-09-14 ENCOUNTER — Other Ambulatory Visit: Payer: Self-pay | Admitting: Family Medicine

## 2011-09-14 DIAGNOSIS — R9389 Abnormal findings on diagnostic imaging of other specified body structures: Secondary | ICD-10-CM

## 2011-09-18 ENCOUNTER — Ambulatory Visit
Admission: RE | Admit: 2011-09-18 | Discharge: 2011-09-18 | Disposition: A | Discharge status: Home / Self Care | Payer: Medicare Other | Source: Ambulatory Visit | Attending: Family Medicine | Admitting: Family Medicine

## 2011-09-18 DIAGNOSIS — R9389 Abnormal findings on diagnostic imaging of other specified body structures: Secondary | ICD-10-CM

## 2011-09-18 DIAGNOSIS — J984 Other disorders of lung: Secondary | ICD-10-CM | POA: Diagnosis not present

## 2011-09-18 DIAGNOSIS — E049 Nontoxic goiter, unspecified: Secondary | ICD-10-CM | POA: Diagnosis not present

## 2011-09-18 MED ORDER — IOHEXOL 300 MG/ML  SOLN
75.0000 mL | Freq: Once | INTRAMUSCULAR | Status: AC | PRN
  Administered 2011-09-18: 75 mL via INTRAVENOUS

## 2011-09-21 DIAGNOSIS — J189 Pneumonia, unspecified organism: Secondary | ICD-10-CM | POA: Diagnosis not present

## 2011-09-25 ENCOUNTER — Other Ambulatory Visit: Payer: Self-pay | Admitting: Family Medicine

## 2011-09-25 DIAGNOSIS — R9389 Abnormal findings on diagnostic imaging of other specified body structures: Secondary | ICD-10-CM

## 2011-09-25 DIAGNOSIS — J189 Pneumonia, unspecified organism: Secondary | ICD-10-CM

## 2011-10-19 ENCOUNTER — Other Ambulatory Visit: Payer: Medicare Other

## 2011-10-25 ENCOUNTER — Other Ambulatory Visit: Payer: Medicare Other

## 2011-10-26 ENCOUNTER — Emergency Department (HOSPITAL_COMMUNITY): Payer: Medicare Other

## 2011-10-26 ENCOUNTER — Encounter (HOSPITAL_COMMUNITY): Payer: Self-pay | Admitting: *Deleted

## 2011-10-26 ENCOUNTER — Emergency Department (HOSPITAL_COMMUNITY)
Admission: EM | Admit: 2011-10-26 | Discharge: 2011-10-26 | Disposition: A | Discharge status: Home / Self Care | Payer: Medicare Other | Attending: Emergency Medicine | Admitting: Emergency Medicine

## 2011-10-26 DIAGNOSIS — R079 Chest pain, unspecified: Secondary | ICD-10-CM

## 2011-10-26 DIAGNOSIS — R918 Other nonspecific abnormal finding of lung field: Secondary | ICD-10-CM | POA: Diagnosis not present

## 2011-10-26 DIAGNOSIS — R404 Transient alteration of awareness: Secondary | ICD-10-CM | POA: Diagnosis not present

## 2011-10-26 DIAGNOSIS — R0602 Shortness of breath: Secondary | ICD-10-CM | POA: Diagnosis not present

## 2011-10-26 DIAGNOSIS — I658 Occlusion and stenosis of other precerebral arteries: Secondary | ICD-10-CM | POA: Diagnosis not present

## 2011-10-26 DIAGNOSIS — R51 Headache: Secondary | ICD-10-CM | POA: Diagnosis not present

## 2011-10-26 DIAGNOSIS — R110 Nausea: Secondary | ICD-10-CM | POA: Insufficient documentation

## 2011-10-26 DIAGNOSIS — I77810 Thoracic aortic ectasia: Secondary | ICD-10-CM | POA: Diagnosis not present

## 2011-10-26 DIAGNOSIS — I10 Essential (primary) hypertension: Secondary | ICD-10-CM | POA: Diagnosis not present

## 2011-10-26 DIAGNOSIS — I6529 Occlusion and stenosis of unspecified carotid artery: Secondary | ICD-10-CM | POA: Diagnosis not present

## 2011-10-26 DIAGNOSIS — R42 Dizziness and giddiness: Secondary | ICD-10-CM | POA: Diagnosis not present

## 2011-10-26 DIAGNOSIS — Z853 Personal history of malignant neoplasm of breast: Secondary | ICD-10-CM | POA: Insufficient documentation

## 2011-10-26 DIAGNOSIS — J984 Other disorders of lung: Secondary | ICD-10-CM | POA: Diagnosis not present

## 2011-10-26 DIAGNOSIS — R911 Solitary pulmonary nodule: Secondary | ICD-10-CM | POA: Diagnosis not present

## 2011-10-26 DIAGNOSIS — J9819 Other pulmonary collapse: Secondary | ICD-10-CM | POA: Diagnosis not present

## 2011-10-26 HISTORY — DX: Major depressive disorder, single episode, unspecified: F32.9

## 2011-10-26 HISTORY — DX: Malignant neoplasm of unspecified site of unspecified female breast: C50.919

## 2011-10-26 HISTORY — DX: Depression, unspecified: F32.A

## 2011-10-26 HISTORY — DX: Anxiety disorder, unspecified: F41.9

## 2011-10-26 HISTORY — DX: Essential (primary) hypertension: I10

## 2011-10-26 HISTORY — DX: Hyperlipidemia, unspecified: E78.5

## 2011-10-26 LAB — URINALYSIS, ROUTINE W REFLEX MICROSCOPIC
Bilirubin Urine: NEGATIVE
Hgb urine dipstick: NEGATIVE
Ketones, ur: NEGATIVE mg/dL
Protein, ur: NEGATIVE mg/dL
Specific Gravity, Urine: 1.009 (ref 1.005–1.030)
Urobilinogen, UA: 0.2 mg/dL (ref 0.0–1.0)

## 2011-10-26 LAB — CARDIAC PANEL(CRET KIN+CKTOT+MB+TROPI)
CK, MB: 1.9 ng/mL (ref 0.3–4.0)
Relative Index: INVALID (ref 0.0–2.5)
Troponin I: <0.3 ng/mL (ref <0.30)

## 2011-10-26 LAB — COMPREHENSIVE METABOLIC PANEL
ALT: 10 U/L (ref 0–35)
AST: 17 U/L (ref 0–37)
Alkaline Phosphatase: 54 U/L (ref 39–117)
CO2: 27 mEq/L (ref 19–32)
Chloride: 107 mEq/L (ref 96–112)
GFR calc non Af Amer: 82 mL/min — ABNORMAL LOW (ref >90)
Glucose, Bld: 78 mg/dL (ref 70–99)
Potassium: 3.4 mEq/L — ABNORMAL LOW (ref 3.5–5.1)
Sodium: 144 mEq/L (ref 135–145)
Total Bilirubin: 0.3 mg/dL (ref 0.3–1.2)

## 2011-10-26 LAB — CBC
Platelets: 191 10*3/uL (ref 150–400)
RBC: 4.33 MIL/uL (ref 3.87–5.11)
RDW: 13 % (ref 11.5–15.5)
WBC: 5.9 10*3/uL (ref 4.0–10.5)

## 2011-10-26 LAB — DIFFERENTIAL
Basophils Absolute: 0 10*3/uL (ref 0.0–0.1)
Lymphocytes Relative: 25 % (ref 12–46)
Lymphs Abs: 1.5 10*3/uL (ref 0.7–4.0)
Neutro Abs: 3.6 10*3/uL (ref 1.7–7.7)

## 2011-10-26 LAB — PROTIME-INR: INR: 0.98 (ref 0.00–1.49)

## 2011-10-26 LAB — POCT I-STAT TROPONIN I

## 2011-10-26 MED ORDER — IOHEXOL 350 MG/ML SOLN
50.0000 mL | Freq: Once | INTRAVENOUS | Status: AC | PRN
  Administered 2011-10-26: 50 mL via INTRAVENOUS

## 2011-10-26 MED ORDER — TRAMADOL HCL 50 MG PO TABS: 50.0000 mg | ORAL_TABLET | Freq: Four times a day (QID) | ORAL | Status: AC | PRN

## 2011-10-26 MED ORDER — ONDANSETRON HCL 4 MG/2ML IJ SOLN
4.0000 mg | Freq: Once | INTRAMUSCULAR | Status: AC
  Administered 2011-10-26: 4 mg via INTRAVENOUS
  Filled 2011-10-26: qty 2

## 2011-10-26 MED ORDER — IOHEXOL 350 MG/ML SOLN
75.0000 mL | Freq: Once | INTRAVENOUS | Status: AC | PRN
  Administered 2011-10-26: 75 mL via INTRAVENOUS

## 2011-10-26 MED ORDER — LEVOFLOXACIN IN D5W 500 MG/100ML IV SOLN
500.0000 mg | INTRAVENOUS | Status: AC
  Administered 2011-10-26: 500 mg via INTRAVENOUS
  Filled 2011-10-26: qty 100

## 2011-10-26 MED ORDER — ONDANSETRON HCL 4 MG PO TABS: 4.0000 mg | ORAL_TABLET | Freq: Four times a day (QID) | ORAL | Status: AC

## 2011-10-26 MED ORDER — FENTANYL CITRATE 0.05 MG/ML IJ SOLN
50.0000 ug | Freq: Once | INTRAMUSCULAR | Status: AC
  Administered 2011-10-26: 50 ug via INTRAVENOUS
  Filled 2011-10-26: qty 2

## 2011-10-26 MED ORDER — SODIUM CHLORIDE 0.9 % IV BOLUS (SEPSIS)
500.0000 mL | Freq: Once | INTRAVENOUS | Status: AC
  Administered 2011-10-26: 500 mL via INTRAVENOUS

## 2011-10-26 MED ORDER — ONDANSETRON HCL 4 MG/2ML IJ SOLN
INTRAMUSCULAR | Status: AC
  Filled 2011-10-26: qty 2

## 2011-10-26 NOTE — ED Notes (Signed)
To ED via EMS for eval of lightheadness/dizzy with severe headache followed by generalized nonradiating chest tightness and near syncopal episode acute onset of approx 1hr ago. Per EMS pt with 20G L AC, 1nitro, 324mg  asa with complete relief of chest pain at arrival, only complaint of severe headache. Pt bp 125/61, p NSR 60s, 97% 2L.

## 2011-10-26 NOTE — Discharge Instructions (Signed)

## 2011-10-26 NOTE — ED Provider Notes (Signed)
History     CSN: [id number]  Arrival date & time 10/26/11  0102   First MD Initiated Contact with Patient 10/26/11 [phone]      Chief Complaint  Patient presents with  . Headache  . Chest Pain    (Consider location/radiation/quality/duration/timing/severity/associated sxs/prior treatment) HPI Pt c/o HA starting around 0800 associated with nausea and light headedness. Pt called EMS and while waiting became anxious and describes non-radiating CP. No SOB. Pt was given NTG. She is now chest pain free but still reports generalized HA with nausea. No weakness, sensory changes, or visual changes. Old records reviewed Past Medical History  Diagnosis Date  . Hypertension   . Hyperlipemia   . Breast cancer   . Depression   . Anxiety     Past Surgical History  Procedure Date  . Cervical spine surgery     No family history on file.  History  Substance Use Topics  . Smoking status: Former Smoker    Quit date: 05/14/1992  . Smokeless tobacco: Never Used  . Alcohol Use: No    OB History    Grav Para Term Preterm Abortions TAB SAB Ect Mult Living                  Review of Systems  Constitutional: Negative for fever and chills.  HENT: Negative for neck pain and neck stiffness.   Eyes: Negative for visual disturbance.  Respiratory: Negative for shortness of breath.   Cardiovascular: Positive for chest pain.  Gastrointestinal: Positive for nausea. Negative for vomiting, abdominal pain, diarrhea and constipation.  Musculoskeletal: Negative for back pain.  Skin: Negative for pallor and rash.  Neurological: Positive for dizziness, light-headedness and headaches. Negative for weakness and numbness.    Allergies  Review of patient's allergies indicates no known allergies.  Home Medications   Current Outpatient Rx  Name Route Sig Dispense Refill  . ASPIRIN EC 81 MG PO TBEC Oral Take 81 mg by mouth daily.    Marland Kitchen CALCIUM PO Oral Take 1 tablet by mouth daily.    Marland Kitchen VITAMIN D-3 PO  Oral Take 1 capsule by mouth daily.    Marland Kitchen ESCITALOPRAM OXALATE 10 MG PO TABS Oral Take 10 mg by mouth daily.    . OMEGA-3 FATTY ACIDS 1000 MG PO CAPS Oral Take 1 g by mouth daily.    Marland Kitchen METOPROLOL SUCCINATE ER 50 MG PO TB24 Oral Take 50 mg by mouth daily. Take with or immediately following a meal.    . SIMVASTATIN 80 MG PO TABS Oral Take 40 mg by mouth at bedtime.    . TRIAMTERENE-HCTZ 37.5-25 MG PO TABS Oral Take 1 tablet by mouth daily.    Marland Kitchen ONDANSETRON HCL 4 MG PO TABS Oral Take 1 tablet (4 mg total) by mouth every 6 (six) hours. 12 tablet 0  . TRAMADOL HCL 50 MG PO TABS Oral Take 1 tablet (50 mg total) by mouth every 6 (six) hours as needed for pain. 15 tablet 0    BP 118/66  Pulse 60  Temp 97.5 F (36.4 C) (Oral)  Resp 15  SpO2 98%  Physical Exam  Nursing note and vitals reviewed. Constitutional: She is oriented to person, place, and time. She appears well-developed and well-nourished. No distress.       Pt is very well appearing in no distress  HENT:  Head: Normocephalic and atraumatic.  Mouth/Throat: Oropharynx is clear and moist.  Eyes: EOM are normal. Pupils are equal, round, and reactive to  light.  Neck: Normal range of motion. Neck supple.       No meningismus. No midline cervical tenderness   Cardiovascular: Normal rate and regular rhythm.   Pulmonary/Chest: Effort normal and breath sounds normal. No respiratory distress. She has no wheezes. She has no rales.  Abdominal: Soft. Bowel sounds are normal. There is no tenderness. There is no rebound and no guarding.  Musculoskeletal: Normal range of motion. She exhibits no edema and no tenderness.       2+ radial pulses that are equal  Neurological: She is alert and oriented to person, place, and time.       Pt with mildly inconsistent motor exam. Initial slight LUE/LLE weakness but with encouragement motor equal bilaterally. Pt using L arm to pul herself up in bed. Sensation intact. NIHSS 0  Skin: Skin is warm and dry. No rash  noted. No erythema.  Psychiatric: She has a normal mood and affect. Her behavior is normal.    ED Course  Procedures (including critical care time)  Labs Reviewed  COMPREHENSIVE METABOLIC PANEL - Abnormal; Notable for the following:    Potassium 3.4 (*)     GFR calc non Af Amer 82 (*)     All other components within normal limits  CBC  DIFFERENTIAL  URINALYSIS, ROUTINE W REFLEX MICROSCOPIC  PROTIME-INR  APTT  CARDIAC PANEL(CRET KIN+CKTOT+MB+TROPI)  POCT I-STAT TROPONIN I   Ct Angio Head W/cm &/or Wo Cm  10/26/2011  *RADIOLOGY REPORT*  Clinical Data:  Headache and chest pain  CT ANGIOGRAPHY HEAD AND NECK  Technique:  Multidetector CT imaging of the head and neck was performed using the standard protocol during bolus administration of intravenous contrast.  Multiplanar CT image reconstructions including MIPs were obtained to evaluate the vascular anatomy. Carotid stenosis measurements (when applicable) are obtained utilizing NASCET criteria, using the distal internal carotid diameter as the denominator.  Contrast: 50mL OMNIPAQUE IOHEXOL 350 MG/ML SOLN  Comparison:  MRA 01/14/2004  CTA NECK  Findings:  Mild atherosclerotic calcification in the aortic arch. Calcified plaque at the origin of the left subclavian artery without significant stenosis.  Innominate artery and left common carotid artery are widely patent at the origin.  Right carotid:  Right common carotid artery is widely patent without significant stenosis.  Calcified plaque is present at the right internal carotid artery bulb without significant stenosis. External carotid artery is patent.  Negative for dissection.  Left carotid:  Left common carotid artery is widely patent. Calcified small plaque of the left carotid bulb without significant stenosis.  Negative for dissection.  Left external carotid artery is patent.  Vertebral arteries:  Both vertebral arteries are patent to the basilar without significant stenosis.  Basilar arteries  are equal in size bilaterally. Heterogeneous and enlarged right thyroid nodule compatible with asymmetric goiter.   Review of the MIP images confirms the above findings.  IMPRESSION: Mild atherosclerotic disease in the carotid bulb bilaterally without significant carotid stenosis.  No significant vertebral stenosis.  CTA HEAD  Findings:  Cavernous carotid is widely patent bilaterally with minimal atherosclerotic calcification in the wall.  Anterior and middle cerebral arteries are patent bilaterally without significant stenosis.  Both vertebral arteries are patent to the basilar.  PICA is patent bilaterally.  Basilar, superior cerebellar, and posterior cerebral arteries are patent bilaterally.  Fetal origin of the left posterior cerebral artery.  Negative for aneurysm.   Review of the MIP images confirms the above findings.  IMPRESSION: No significant intracranial stenosis.  Original  Report Authenticated By: Camelia Phenes, M.D.   Ct Head Wo Contrast  10/26/2011  *RADIOLOGY REPORT*  Clinical Data: Dizziness with severe headache.  CT HEAD WITHOUT CONTRAST  Technique:  Contiguous axial images were obtained from the base of the skull through the vertex without contrast.  Comparison: Limited correlation is made with an intracranial MRA 01/14/2004.  Findings: There is no evidence of acute intracranial hemorrhage, mass lesion, brain edema or extra-axial fluid collection.  The ventricles and subarachnoid spaces are appropriately sized for age. There is no CT evidence of acute cortical infarction.  Small extraaxial ossification is noted the left frontal region on image 14 without mass effect on the adjacent cortex.  The visualized paranasal sinuses are clear aside from minimal ethmoid sinus mucosal thickening. The calvarium is intact.  IMPRESSION: No acute or significant findings. Minimal ethmoid sinus mucosal thickening.  Original Report Authenticated By: Gerrianne Scale, M.D.   Ct Angio Neck W/cm &/or  Wo/cm  10/26/2011  *RADIOLOGY REPORT*  Clinical Data:  Headache and chest pain  CT ANGIOGRAPHY HEAD AND NECK  Technique:  Multidetector CT imaging of the head and neck was performed using the standard protocol during bolus administration of intravenous contrast.  Multiplanar CT image reconstructions including MIPs were obtained to evaluate the vascular anatomy. Carotid stenosis measurements (when applicable) are obtained utilizing NASCET criteria, using the distal internal carotid diameter as the denominator.  Contrast: 50mL OMNIPAQUE IOHEXOL 350 MG/ML SOLN  Comparison:  MRA 01/14/2004  CTA NECK  Findings:  Mild atherosclerotic calcification in the aortic arch. Calcified plaque at the origin of the left subclavian artery without significant stenosis.  Innominate artery and left common carotid artery are widely patent at the origin.  Right carotid:  Right common carotid artery is widely patent without significant stenosis.  Calcified plaque is present at the right internal carotid artery bulb without significant stenosis. External carotid artery is patent.  Negative for dissection.  Left carotid:  Left common carotid artery is widely patent. Calcified small plaque of the left carotid bulb without significant stenosis.  Negative for dissection.  Left external carotid artery is patent.  Vertebral arteries:  Both vertebral arteries are patent to the basilar without significant stenosis.  Basilar arteries are equal in size bilaterally. Heterogeneous and enlarged right thyroid nodule compatible with asymmetric goiter.   Review of the MIP images confirms the above findings.  IMPRESSION: Mild atherosclerotic disease in the carotid bulb bilaterally without significant carotid stenosis.  No significant vertebral stenosis.  CTA HEAD  Findings:  Cavernous carotid is widely patent bilaterally with minimal atherosclerotic calcification in the wall.  Anterior and middle cerebral arteries are patent bilaterally without significant  stenosis.  Both vertebral arteries are patent to the basilar.  PICA is patent bilaterally.  Basilar, superior cerebellar, and posterior cerebral arteries are patent bilaterally.  Fetal origin of the left posterior cerebral artery.  Negative for aneurysm.   Review of the MIP images confirms the above findings.  IMPRESSION: No significant intracranial stenosis.  Original Report Authenticated By: Camelia Phenes, M.D.   Ct Angio Chest W/cm &/or Wo Cm  10/26/2011  *RADIOLOGY REPORT*  Clinical Data: Headache with chest pain and dizziness.  Question pulmonary embolism.  CT ANGIOGRAPHY CHEST  Technique:  Multidetector CT imaging of the chest using the standard protocol during bolus administration of intravenous contrast. Multiplanar reconstructed images including MIPs were obtained and reviewed to evaluate the vascular anatomy.  Contrast: 50mL OMNIPAQUE IOHEXOL 350 MG/ML SOLN  Comparison: Chest CT  09/18/2011.  Findings: The pulmonary arteries are well opacified with contrast. There is no evidence of acute pulmonary embolism.  The ascending aorta is mildly dilated to 3.8 cm.  There is stable atherosclerosis of the descending aorta, great vessels and coronary arteries.  Node dissection is identified.  There are no enlarged mediastinal or hilar lymph nodes.  There is no pleural or pericardial effusion.  Asymmetry of the thyroid gland is stable with relative enlargement and nodularity on the right.  There are overall lower lung volumes with resulting increased bibasilar atelectasis.  The patchy left greater than right ground- glass opacities noted previously have otherwise not significantly changed.  These are most prominent within the left upper lobe. There is no endobronchial lesion or mass.  The visualized upper abdomen appears stable.  There is a small hiatal hernia. Lower thoracic compression deformity appears unchanged.  IMPRESSION:  1.  No evidence of acute pulmonary embolism. 2.  Stable dilatation of the ascending  aorta and diffuse atherosclerosis. 3.  Generally stable scattered ground-glass opacities in both lungs, still likely inflammatory in etiology.  CT followup in 3 months recommended to exclude the possibility of atypical neoplasm.  Original Report Authenticated By: Gerrianne Scale, M.D.   Dg Chest Port 1 View  10/26/2011  *RADIOLOGY REPORT*  Clinical Data: Shortness of breath.  Chest pain.  PORTABLE CHEST - 1 VIEW  Comparison: CT 09/18/2011 prior radiographs 09/13/2011.  Findings: The patchy perihilar and left basilar air space opacities demonstrated on CT have progressed.  No focal airspace disease is seen on the right.  There is no pleural effusion.  Heart size and mediastinal contours are stable.  Surgical clips in the superior left paraspinal region appear unchanged.  IMPRESSION: Worsened left basilar air space disease since last month concerning for pneumonia. Correlate clinically.  Original Report Authenticated By: Gerrianne Scale, M.D.     1. Headache   2. Chest pain       MDM   Date: 10/26/2011  Rate: 56  Rhythm: sinus bradycardia  QRS Axis: normal  Intervals: normal  ST/T Wave abnormalities: normal  Conduction Disutrbances:none  Narrative Interpretation:   Old EKG Reviewed: unchanged     Pt resting comfortably. Pain improved. Return for worsening symptoms or concerns  Loren Racer, MD 10/26/11 1523

## 2011-10-29 ENCOUNTER — Other Ambulatory Visit: Payer: Medicare Other

## 2011-11-02 DIAGNOSIS — R51 Headache: Secondary | ICD-10-CM | POA: Diagnosis not present

## 2011-11-02 DIAGNOSIS — R9389 Abnormal findings on diagnostic imaging of other specified body structures: Secondary | ICD-10-CM | POA: Diagnosis not present

## 2011-11-20 DIAGNOSIS — R9389 Abnormal findings on diagnostic imaging of other specified body structures: Secondary | ICD-10-CM | POA: Diagnosis not present

## 2011-11-20 DIAGNOSIS — I10 Essential (primary) hypertension: Secondary | ICD-10-CM | POA: Diagnosis not present

## 2011-11-20 DIAGNOSIS — Z23 Encounter for immunization: Secondary | ICD-10-CM | POA: Diagnosis not present

## 2011-11-20 DIAGNOSIS — Z0000 Encounter for general adult medical examination without abnormal findings: Secondary | ICD-10-CM | POA: Diagnosis not present

## 2011-11-20 DIAGNOSIS — E7800 Pure hypercholesterolemia, unspecified: Secondary | ICD-10-CM | POA: Diagnosis not present

## 2011-11-20 DIAGNOSIS — F411 Generalized anxiety disorder: Secondary | ICD-10-CM | POA: Diagnosis not present

## 2011-11-20 DIAGNOSIS — Z79899 Other long term (current) drug therapy: Secondary | ICD-10-CM | POA: Diagnosis not present

## 2011-11-20 DIAGNOSIS — M810 Age-related osteoporosis without current pathological fracture: Secondary | ICD-10-CM | POA: Diagnosis not present

## 2012-01-29 ENCOUNTER — Ambulatory Visit
Admission: RE | Admit: 2012-01-29 | Discharge: 2012-01-29 | Disposition: A | Discharge status: Home / Self Care | Payer: Medicare Other | Source: Ambulatory Visit | Attending: Family Medicine | Admitting: Family Medicine

## 2012-01-29 ENCOUNTER — Other Ambulatory Visit: Payer: Medicare Other

## 2012-01-29 DIAGNOSIS — R918 Other nonspecific abnormal finding of lung field: Secondary | ICD-10-CM | POA: Diagnosis not present

## 2012-01-29 DIAGNOSIS — R9389 Abnormal findings on diagnostic imaging of other specified body structures: Secondary | ICD-10-CM

## 2012-01-29 DIAGNOSIS — Z853 Personal history of malignant neoplasm of breast: Secondary | ICD-10-CM | POA: Diagnosis not present

## 2012-02-15 DIAGNOSIS — H259 Unspecified age-related cataract: Secondary | ICD-10-CM | POA: Diagnosis not present

## 2012-02-15 DIAGNOSIS — Z961 Presence of intraocular lens: Secondary | ICD-10-CM | POA: Diagnosis not present

## 2012-02-15 DIAGNOSIS — H31009 Unspecified chorioretinal scars, unspecified eye: Secondary | ICD-10-CM | POA: Diagnosis not present

## 2012-02-15 DIAGNOSIS — H43819 Vitreous degeneration, unspecified eye: Secondary | ICD-10-CM | POA: Diagnosis not present

## 2012-03-18 DIAGNOSIS — R05 Cough: Secondary | ICD-10-CM | POA: Diagnosis not present

## 2012-03-18 DIAGNOSIS — R0602 Shortness of breath: Secondary | ICD-10-CM | POA: Diagnosis not present

## 2012-03-21 ENCOUNTER — Encounter (HOSPITAL_COMMUNITY): Payer: Self-pay

## 2012-03-21 ENCOUNTER — Inpatient Hospital Stay (HOSPITAL_COMMUNITY)
Admission: EM | Admit: 2012-03-21 | Discharge: 2012-03-24 | DRG: 195 | Disposition: A | Discharge status: Home / Self Care | Payer: Medicare Other | Attending: Internal Medicine | Admitting: Internal Medicine

## 2012-03-21 ENCOUNTER — Emergency Department (HOSPITAL_COMMUNITY): Payer: Medicare Other

## 2012-03-21 DIAGNOSIS — J189 Pneumonia, unspecified organism: Secondary | ICD-10-CM | POA: Diagnosis not present

## 2012-03-21 DIAGNOSIS — I2510 Atherosclerotic heart disease of native coronary artery without angina pectoris: Secondary | ICD-10-CM | POA: Diagnosis present

## 2012-03-21 DIAGNOSIS — Z853 Personal history of malignant neoplasm of breast: Secondary | ICD-10-CM

## 2012-03-21 DIAGNOSIS — Z90710 Acquired absence of both cervix and uterus: Secondary | ICD-10-CM

## 2012-03-21 DIAGNOSIS — E860 Dehydration: Secondary | ICD-10-CM | POA: Diagnosis not present

## 2012-03-21 DIAGNOSIS — M129 Arthropathy, unspecified: Secondary | ICD-10-CM | POA: Diagnosis present

## 2012-03-21 DIAGNOSIS — E876 Hypokalemia: Secondary | ICD-10-CM | POA: Diagnosis not present

## 2012-03-21 DIAGNOSIS — Z79899 Other long term (current) drug therapy: Secondary | ICD-10-CM

## 2012-03-21 DIAGNOSIS — Z8701 Personal history of pneumonia (recurrent): Secondary | ICD-10-CM

## 2012-03-21 DIAGNOSIS — E7800 Pure hypercholesterolemia, unspecified: Secondary | ICD-10-CM | POA: Diagnosis present

## 2012-03-21 DIAGNOSIS — F411 Generalized anxiety disorder: Secondary | ICD-10-CM | POA: Diagnosis present

## 2012-03-21 DIAGNOSIS — Z87891 Personal history of nicotine dependence: Secondary | ICD-10-CM

## 2012-03-21 DIAGNOSIS — F329 Major depressive disorder, single episode, unspecified: Secondary | ICD-10-CM | POA: Diagnosis present

## 2012-03-21 DIAGNOSIS — F3289 Other specified depressive episodes: Secondary | ICD-10-CM | POA: Diagnosis present

## 2012-03-21 DIAGNOSIS — Z23 Encounter for immunization: Secondary | ICD-10-CM

## 2012-03-21 DIAGNOSIS — K219 Gastro-esophageal reflux disease without esophagitis: Secondary | ICD-10-CM | POA: Diagnosis present

## 2012-03-21 DIAGNOSIS — E785 Hyperlipidemia, unspecified: Secondary | ICD-10-CM | POA: Diagnosis present

## 2012-03-21 DIAGNOSIS — I10 Essential (primary) hypertension: Secondary | ICD-10-CM | POA: Diagnosis present

## 2012-03-21 HISTORY — DX: Unspecified osteoarthritis, unspecified site: M19.90

## 2012-03-21 HISTORY — DX: Reserved for inherently not codable concepts without codable children: IMO0001

## 2012-03-21 HISTORY — DX: Atherosclerotic heart disease of native coronary artery without angina pectoris: I25.10

## 2012-03-21 HISTORY — DX: Pure hypercholesterolemia, unspecified: E78.00

## 2012-03-21 HISTORY — DX: Gastro-esophageal reflux disease without esophagitis: K21.9

## 2012-03-21 HISTORY — DX: Reserved for concepts with insufficient information to code with codable children: IMO0002

## 2012-03-21 HISTORY — DX: Bronchitis, not specified as acute or chronic: J40

## 2012-03-21 HISTORY — DX: Pneumonia, unspecified organism: J18.9

## 2012-03-21 HISTORY — DX: Iodine-deficiency related diffuse (endemic) goiter: E01.0

## 2012-03-21 LAB — COMPREHENSIVE METABOLIC PANEL
AST: 17 U/L (ref 0–37)
Alkaline Phosphatase: 82 U/L (ref 39–117)
BUN: 10 mg/dL (ref 6–23)
CO2: 24 mEq/L (ref 19–32)
Chloride: 101 mEq/L (ref 96–112)
Creatinine, Ser: 0.76 mg/dL (ref 0.50–1.10)
GFR calc non Af Amer: 82 mL/min — ABNORMAL LOW (ref >90)
Total Bilirubin: 0.4 mg/dL (ref 0.3–1.2)

## 2012-03-21 LAB — CBC WITH DIFFERENTIAL/PLATELET
HCT: 38.5 % (ref 36.0–46.0)
Hemoglobin: 13.8 g/dL (ref 12.0–15.0)
Lymphocytes Relative: 19 % (ref 12–46)
Monocytes Absolute: 0.7 10*3/uL (ref 0.1–1.0)
Monocytes Relative: 8 % (ref 3–12)
Neutro Abs: 6.5 10*3/uL (ref 1.7–7.7)
RBC: 4.6 MIL/uL (ref 3.87–5.11)
WBC: 9 10*3/uL (ref 4.0–10.5)

## 2012-03-21 MED ORDER — SODIUM CHLORIDE 0.9 % IV BOLUS (SEPSIS)
1000.0000 mL | Freq: Once | INTRAVENOUS | Status: AC
  Administered 2012-03-21: 1000 mL via INTRAVENOUS

## 2012-03-21 MED ORDER — CEFTRIAXONE SODIUM 1 G IJ SOLR: 1.0000 g | Freq: Once | INTRAMUSCULAR | Status: DC

## 2012-03-21 MED ORDER — ALPRAZOLAM 0.5 MG PO TABS
0.5000 mg | ORAL_TABLET | Freq: Two times a day (BID) | ORAL | Status: DC | PRN
  Administered 2012-03-21 – 2012-03-23 (×3): 0.5 mg via ORAL
  Filled 2012-03-21 (×3): qty 1

## 2012-03-21 MED ORDER — DEXTROSE 5 % IV SOLN
500.0000 mg | INTRAVENOUS | Status: DC
  Administered 2012-03-22 – 2012-03-23 (×2): 500 mg via INTRAVENOUS
  Filled 2012-03-21 (×4): qty 500

## 2012-03-21 MED ORDER — ENOXAPARIN SODIUM 40 MG/0.4ML ~~LOC~~ SOLN
40.0000 mg | SUBCUTANEOUS | Status: DC
  Filled 2012-03-21: qty 0.4

## 2012-03-21 MED ORDER — DEXTROSE 5 % IV SOLN
1.0000 g | INTRAVENOUS | Status: DC
  Administered 2012-03-22 – 2012-03-23 (×2): 1 g via INTRAVENOUS
  Filled 2012-03-21 (×4): qty 10

## 2012-03-21 MED ORDER — ATORVASTATIN CALCIUM 40 MG PO TABS
40.0000 mg | ORAL_TABLET | Freq: Every day | ORAL | Status: DC
  Administered 2012-03-22 – 2012-03-23 (×2): 40 mg via ORAL
  Filled 2012-03-21 (×3): qty 1

## 2012-03-21 MED ORDER — SODIUM CHLORIDE 0.9 % IV SOLN
INTRAVENOUS | Status: DC
  Administered 2012-03-21 – 2012-03-23 (×5): via INTRAVENOUS

## 2012-03-21 MED ORDER — ESCITALOPRAM OXALATE 10 MG PO TABS
10.0000 mg | ORAL_TABLET | Freq: Every day | ORAL | Status: DC
  Administered 2012-03-22 – 2012-03-24 (×3): 10 mg via ORAL
  Filled 2012-03-21 (×3): qty 1

## 2012-03-21 MED ORDER — DEXTROSE 5 % IV SOLN
1.0000 g | Freq: Once | INTRAVENOUS | Status: AC
  Administered 2012-03-21: 1 g via INTRAVENOUS
  Filled 2012-03-21: qty 10

## 2012-03-21 MED ORDER — ALBUTEROL SULFATE HFA 108 (90 BASE) MCG/ACT IN AERS
2.0000 | INHALATION_SPRAY | RESPIRATORY_TRACT | Status: DC | PRN
  Filled 2012-03-21: qty 6.7

## 2012-03-21 MED ORDER — ENOXAPARIN SODIUM 40 MG/0.4ML ~~LOC~~ SOLN
40.0000 mg | SUBCUTANEOUS | Status: DC
  Administered 2012-03-21 – 2012-03-24 (×3): 40 mg via SUBCUTANEOUS
  Filled 2012-03-21 (×3): qty 0.4

## 2012-03-21 MED ORDER — POTASSIUM CHLORIDE CRYS ER 20 MEQ PO TBCR
40.0000 meq | EXTENDED_RELEASE_TABLET | Freq: Once | ORAL | Status: AC
  Administered 2012-03-21: 40 meq via ORAL
  Filled 2012-03-21: qty 2

## 2012-03-21 MED ORDER — METOPROLOL SUCCINATE ER 50 MG PO TB24
50.0000 mg | ORAL_TABLET | Freq: Every day | ORAL | Status: DC
  Administered 2012-03-22 – 2012-03-24 (×3): 50 mg via ORAL
  Filled 2012-03-21 (×3): qty 1

## 2012-03-21 MED ORDER — ASPIRIN 81 MG PO CHEW
81.0000 mg | CHEWABLE_TABLET | Freq: Every day | ORAL | Status: DC
  Administered 2012-03-21 – 2012-03-24 (×4): 81 mg via ORAL
  Filled 2012-03-21 (×4): qty 1

## 2012-03-21 MED ORDER — AZITHROMYCIN 250 MG PO TABS
1000.0000 mg | ORAL_TABLET | Freq: Once | ORAL | Status: AC
  Administered 2012-03-21: 1000 mg via ORAL
  Filled 2012-03-21: qty 4

## 2012-03-21 NOTE — ED Notes (Signed)
Pt reports she was dx w/double pneumonia Tuesday November 5, started taking ABX w/no change, returned to day for a f/u, pcp sent pt to ED for further evaluation.  Pt reports SOB, weakness, lightheaded, non-productive cough until today, reports she was able to cough of small mount of "tan" colored mucus today, and chest/back pain w/coughing.

## 2012-03-21 NOTE — ED Notes (Signed)
Called report to [place], [place] [address].

## 2012-03-21 NOTE — H&P (Signed)
PCP:    Elie Confer, MD  Cardiology: [facility])  Chief Complaint:   cough  HPI: Tiffany Kane is a 71 y.o. female   has a past medical history of Hypertension; Hyperlipemia; Depression; Anxiety; High cholesterol; Coronary artery disease; Esophageal reflux; Arthritis; Ascites; Pneumonia; Thyromegaly; Compression fracture; Thoracic aneurysm; Bronchitis; and Breast cancer (2002).   Presented with  She has been sick for about 1 week with cough that caused her chest to hurt. She went to PCP 3 days ago. She has been taking it for the past 3 days. She have had some low grade fever. She was  seen by her PCP again today because she has  Been getting weaker. She states that she have not eaten much lately due to lack of appetite.  She still has a cough and this morning she was coughing up some brownish mucus.  Review of Systems:     Pertinent positives include: Fevers, chills, fatigue,  shortness of breath at rest.  dyspnea on exertion,productive cough,   change in color of mucus Constitutional:  No weight loss, night sweats, weight loss  HEENT:  No headaches, Difficulty swallowing,Tooth/dental problems,Sore throat,  No sneezing, itching, ear ache, nasal congestion, post nasal drip,  Cardio-vascular:  No chest pain, Orthopnea, PND, anasarca, dizziness, palpitations.no Bilateral lower extremity swelling  GI:  No heartburn, indigestion, abdominal pain, nausea, vomiting, diarrhea, change in bowel habits, loss of appetite, melena, blood in stool, hematemesis Resp:   No excess mucus,  No coughing up of blood. No wheezing. Skin:  no rash or lesions. No jaundice GU:  no dysuria, change in color of urine, no urgency or frequency. No straining to urinate.  No flank pain.  Musculoskeletal:  No joint pain or no joint swelling. No decreased range of motion. No back pain.  Psych:  No change in mood or affect. No depression or anxiety. No memory loss.  Neuro: no  localizing neurological complaints, no tingling, no weakness, no double vision, no gait abnormality, no slurred speech, no confusion  Otherwise ROS are negative except for above, 10 systems were reviewed  Past Medical History: Past Medical History  Diagnosis Date  . Hypertension   . Hyperlipemia   . Depression   . Anxiety   . High cholesterol   . Coronary artery disease   . Esophageal reflux   . Arthritis   . Ascites   . Pneumonia   . Thyromegaly   . Compression fracture     T-11  . Thoracic aneurysm   . Bronchitis   . Breast cancer 2002   Past Surgical History  Procedure Date  . Cervical spine surgery   . Abdominal hysterectomy   . Cervical spine surgery      Medications: Prior to Admission medications   Medication Sig Start Date End Date Taking? Authorizing Provider  albuterol (PROVENTIL HFA;VENTOLIN HFA) 108 (90 BASE) MCG/ACT inhaler Inhale 2 puffs into the lungs every 4 (four) hours as needed. For shortness of breath   Yes Historical Provider, MD  ALPRAZolam Prudy Feeler) 0.5 MG tablet Take 0.5 mg by mouth Twice daily as needed. For anxiety 01/01/12  Yes Historical Provider, MD  aspirin 81 MG chewable tablet Chew 81 mg by mouth daily.   Yes Historical Provider, MD  CALCIUM PO Take 1 tablet by mouth daily.   Yes Historical Provider, MD  Cholecalciferol (VITAMIN D3) 2000 UNITS capsule Take 2,000 Units by mouth daily.   Yes Historical Provider, MD  escitalopram (LEXAPRO) 10 MG tablet  Take 10 mg by mouth daily.   Yes Historical Provider, MD  fish oil-omega-3 fatty acids 1000 MG capsule Take 1 g by mouth daily.   Yes Historical Provider, MD  ibuprofen (ADVIL,MOTRIN) 200 MG tablet Take 200 mg by mouth every 6 (six) hours as needed. For pain   Yes Historical Provider, MD  levofloxacin (LEVAQUIN) 500 MG tablet Take 500 mg by mouth daily. For 10 days; Start date 03/18/12   Yes Historical Provider, MD  metoprolol succinate (TOPROL-XL) 50 MG 24 hr tablet Take 50 mg by mouth daily. Take  with or immediately following a meal.   Yes Historical Provider, MD  simvastatin (ZOCOR) 80 MG tablet Take 40 mg by mouth at bedtime.   Yes Historical Provider, MD  triamterene-hydrochlorothiazide (MAXZIDE-25) 37.5-25 MG per tablet Take 1 tablet by mouth daily.   Yes Historical Provider, MD    Allergies:  No Known Allergies  Social History:  Ambulatory   Independently  Lives at home with son.    reports that she quit smoking about 19 years ago. She has never used smokeless tobacco. She reports that she does not drink alcohol or use illicit drugs.   Family History: family history includes Breast cancer in her mother; Cancer - Other in her father; Diabetes in her mother; Heart disease in her brother; and Prostate cancer in her brother.    Physical Exam: Patient Vitals for the past 24 hrs:  BP Temp Pulse Resp SpO2  03/21/12 2030 112/77 mmHg - 74  - 99 %  03/21/12 2000 131/74 mmHg - 70  - 99 %  03/21/12 1950 118/78 mmHg - 62  - 100 %  03/21/12 1719 124/78 mmHg - 79  18  97 %    1. General:  in No Acute distress 2. Psychological: Alert and  Oriented 3. Head/ENT:     Dry Mucous Membranes                          Head Non traumatic, neck supple                          Normal Dentition 4. SKIN:  decreased Skin turgor,  Skin clean Dry and intact no rash 5. Heart: Regular rate and rhythm no Murmur, Rub or gallop 6. Lungs:  no wheezes bilateral crackles  Left >right 7. Abdomen: Soft, non-tender, Non distended 8. Lower extremities: no clubbing, cyanosis, or edema 9. Neurologically Grossly intact, moving all 4 extremities equally 10. MSK: Normal range of motion  body mass index is unknown because there is no height or weight on file.   Labs on Admission:   [facility] 03/21/12 1815  NA 140  K 2.8*  CL 101  CO2 24  GLUCOSE 110*  BUN 10  CREATININE 0.76  CALCIUM 9.8  MG --  PHOS --    Basename 03/21/12 1815  AST 17  ALT 8  ALKPHOS 82  BILITOT 0.4  PROT 7.2  ALBUMIN 3.7    No results found for this basename: LIPASE:2,AMYLASE:2 in the last 72 hours  Basename 03/21/12 1815  WBC 9.0  NEUTROABS 6.5  HGB 13.8  HCT 38.5  MCV 83.7  PLT 253   No results found for this basename: CKTOTAL:3,CKMB:3,CKMBINDEX:3,TROPONINI:3 in the last 72 hours No results found for this basename: TSH,T4TOTAL,FREET3,T3FREE,THYROIDAB in the last 72 hours No results found for this basename: VITAMINB12:2,FOLATE:2,FERRITIN:2,TIBC:2,IRON:2,RETICCTPCT:2 in the last 72 hours No results found for  this basename: HGBA1C    CrCl is unknown because there is no height on file for the current visit. ABG    Component Value Date/Time   TCO2 26 03/02/2008 1311     No results found for this basename: DDIMER      Cultures: No results found for this basename: sdes, specrequest, cult, reptstatus       Radiological Exams on Admission: Dg Chest 2 View  03/21/2012  *RADIOLOGY REPORT*  Clinical Data: Pneumonia, increasing shortness of breath, weakness  CHEST - 2 VIEW  Comparison: 03/18/2012; 10/26/2011; 09/13/2011; 08/23/2009 chest CT - 01/29/2012  Findings:  Grossly unchanged cardiac silhouette and mediastinal contours with mild ectasia and tortuosity of the thoracic aorta.  Scattered bilateral peripheral predominant fine and coarse reticular opacities, left greater than right, are grossly unchanged.  No focal new focal airspace opacities.  No definite pleural effusion or pneumothorax.  Surgical clips overlie the left thoracic inlet. Mild scoliotic curvature of the thoracolumbar spine.  IMPRESSION: Grossly unchanged bilateral peripheral predominant fine and coarse reticular opacities, left greater than right, presumably secondary to multifocal infection, including atypicals, though as was stated on recent chest CT, a follow-up chest CT scan in 4 to 6 months is recommended to ensure resolution.   Original Report Authenticated By: Tacey Ruiz, MD     Chart has been reviewed  Assessment/Plan  71 yo  F with CAP not improving on levaquin  Present on Admission:  . Community acquired pneumonia -  - will admit for treatment of CAP will start on appropriate antibiotic coverage. Rocephin and azithro   Obtain sputum cultures, blood cultures if febrile or if decompensates.  Provide oxygen as needed.   . Hypokalemia - will replace   Prophylaxis:  Lovenox   CODE STATUS: FULL CODE  Other plan as per orders.  I have spent a total of 55 min on this admission  Randa Riss 03/21/2012, 8:48 PM

## 2012-03-21 NOTE — ED Provider Notes (Addendum)
Presents with cough and shortness of breath cough productive of tan sputum onset 5 days ago. Feels worse today despite treatment with Levaquin. Maximum temperature 98.9. Seen by Dr. Clyde Canterbury he office today sent here for further evaluation and admission On exam patient in no distress lungs Rales left side posteriorly no respiratory distress heart regular rhythm abdomen nondistended nontender Chest x-ray reviewed by me And treat for community acquired pneumonia Plan spoke with Dr.Doutova: Observation telemetry, Vioxx, potassium repletion Dx #1 community-acquired pneumonia #2 hypokalemia Doug Sou, MD 03/21/12 1901  Doug Sou, MD 03/22/12 4782

## 2012-03-21 NOTE — ED Provider Notes (Signed)
History     CSN: [id number]  Arrival date & time 03/21/12  1641   First MD Initiated Contact with Patient 03/21/12 1752      Chief Complaint  Patient presents with  . Pneumonia    (Consider location/radiation/quality/duration/timing/severity/associated sxs/prior treatment) HPI Comments: This is a 71 year old female, who presents to the emergency department with chief complaint of pneumonia. Patient states that she was seen by her PCP Dr. Clyde Canterbury, on Wednesday, 2 diagnosed her with pneumonia and started her on Levaquin. Patient was reexamined today by PCP, and instructed to come to the emergency department to be admitted to the hospital for failing outpatient treatment. Patient states that her symptoms have not improved. She endorses shortness of breath, cough, and feeling weak. She denies headache, chest pain, nausea, vomiting, abdominal pain, diarrhea, constipation, dysuria, peripheral edema, and numbness and tingling. She states that she is in moderate discomfort.  The history is provided by the patient. No language interpreter was used.    Past Medical History  Diagnosis Date  . Hypertension   . Hyperlipemia   . Breast cancer   . Depression   . Anxiety   . Renal disorder   . High cholesterol   . Coronary artery disease   . Esophageal reflux   . Arthritis   . Ascites   . Pneumonia   . Thyromegaly   . Compression fracture     T-11  . Thoracic aneurysm   . Bronchitis     Past Surgical History  Procedure Date  . Cervical spine surgery   . Abdominal hysterectomy   . Cervical spine surgery     History reviewed. No pertinent family history.  History  Substance Use Topics  . Smoking status: Former Smoker    Quit date: 05/14/1992  . Smokeless tobacco: Never Used  . Alcohol Use: No    OB History    Grav Para Term Preterm Abortions TAB SAB Ect Mult Living                  Review of Systems  All other systems reviewed and are negative.    Allergies    Review of patient's allergies indicates no known allergies.  Home Medications   Current Outpatient Rx  Name  Route  Sig  Dispense  Refill  . ALBUTEROL SULFATE HFA 108 (90 BASE) MCG/ACT IN AERS   Inhalation   Inhale 2 puffs into the lungs every 4 (four) hours as needed. For shortness of breath         . ALPRAZOLAM 0.5 MG PO TABS   Oral   Take 0.5 mg by mouth Twice daily as needed. For anxiety         . ASPIRIN 81 MG PO CHEW   Oral   Chew 81 mg by mouth daily.         Marland Kitchen CALCIUM PO   Oral   Take 1 tablet by mouth daily.         Marland Kitchen VITAMIN D3 2000 UNITS PO CAPS   Oral   Take 2,000 Units by mouth daily.         Marland Kitchen ESCITALOPRAM OXALATE 10 MG PO TABS   Oral   Take 10 mg by mouth daily.         . OMEGA-3 FATTY ACIDS 1000 MG PO CAPS   Oral   Take 1 g by mouth daily.         . IBUPROFEN 200 MG PO TABS   Oral  Take 200 mg by mouth every 6 (six) hours as needed. For pain         . LEVOFLOXACIN 500 MG PO TABS   Oral   Take 500 mg by mouth daily. For 10 days; Start date 03/18/12         . METOPROLOL SUCCINATE ER 50 MG PO TB24   Oral   Take 50 mg by mouth daily. Take with or immediately following a meal.         . SIMVASTATIN 80 MG PO TABS   Oral   Take 40 mg by mouth at bedtime.         . TRIAMTERENE-HCTZ 37.5-25 MG PO TABS   Oral   Take 1 tablet by mouth daily.           BP 124/78  Pulse 79  Resp 18  SpO2 97%  Physical Exam  Nursing note and vitals reviewed. Constitutional: She is oriented to person, place, and time. She appears well-developed and well-nourished.  HENT:  Head: Normocephalic and atraumatic.  Eyes: Conjunctivae normal and EOM are normal. Pupils are equal, round, and reactive to light.  Neck: Normal range of motion. Neck supple.  Cardiovascular: Normal rate and regular rhythm.  Exam reveals no gallop and no friction rub.   No murmur heard. Pulmonary/Chest: Effort normal. No respiratory distress. She has wheezes. She has no  rales. She exhibits no tenderness.       Scattered quiet wheezes.  Abdominal: Soft. Bowel sounds are normal. She exhibits no distension and no mass. There is no tenderness. There is no rebound and no guarding.  Musculoskeletal: Normal range of motion. She exhibits no edema and no tenderness.  Neurological: She is alert and oriented to person, place, and time.  Skin: Skin is warm and dry.  Psychiatric: She has a normal mood and affect. Her behavior is normal. Judgment and thought content normal.    ED Course  Procedures (including critical care time)   Labs Reviewed  CBC WITH DIFFERENTIAL  COMPREHENSIVE METABOLIC PANEL   Dg Chest 2 View  03/21/2012  *RADIOLOGY REPORT*  Clinical Data: Pneumonia, increasing shortness of breath, weakness  CHEST - 2 VIEW  Comparison: 03/18/2012; 10/26/2011; 09/13/2011; 08/23/2009 chest CT - 01/29/2012  Findings:  Grossly unchanged cardiac silhouette and mediastinal contours with mild ectasia and tortuosity of the thoracic aorta.  Scattered bilateral peripheral predominant fine and coarse reticular opacities, left greater than right, are grossly unchanged.  No focal new focal airspace opacities.  No definite pleural effusion or pneumothorax.  Surgical clips overlie the left thoracic inlet. Mild scoliotic curvature of the thoracolumbar spine.  IMPRESSION: Grossly unchanged bilateral peripheral predominant fine and coarse reticular opacities, left greater than right, presumably secondary to multifocal infection, including atypicals, though as was stated on recent chest CT, a follow-up chest CT scan in 4 to 6 months is recommended to ensure resolution.   Original Report Authenticated By: Tacey Ruiz, MD    Results for orders placed during the hospital encounter of 03/21/12  CBC WITH DIFFERENTIAL      Component Value Range   WBC 9.0  4.0 - 10.5 K/uL   RBC 4.60  3.87 - 5.11 MIL/uL   Hemoglobin 13.8  12.0 - 15.0 g/dL   HCT 96.0  45.4 - 09.8 %   MCV 83.7  78.0 - 100.0  fL   MCH 30.0  26.0 - 34.0 pg   MCHC 35.8  30.0 - 36.0 g/dL   RDW 12.2  11.5 - 15.5 %   Platelets 253  150 - 400 K/uL   Neutrophils Relative 73  43 - 77 %   Neutro Abs 6.5  1.7 - 7.7 K/uL   Lymphocytes Relative 19  12 - 46 %   Lymphs Abs 1.7  0.7 - 4.0 K/uL   Monocytes Relative 8  3 - 12 %   Monocytes Absolute 0.7  0.1 - 1.0 K/uL   Eosinophils Relative 1  0 - 5 %   Eosinophils Absolute 0.1  0.0 - 0.7 K/uL   Basophils Relative 0  0 - 1 %   Basophils Absolute 0.0  0.0 - 0.1 K/uL  COMPREHENSIVE METABOLIC PANEL      Component Value Range   Sodium 140  135 - 145 mEq/L   Potassium 2.8 (*) 3.5 - 5.1 mEq/L   Chloride 101  96 - 112 mEq/L   CO2 24  19 - 32 mEq/L   Glucose, Bld 110 (*) 70 - 99 mg/dL   BUN 10  6 - 23 mg/dL   Creatinine, Ser 5.62  0.50 - 1.10 mg/dL   Calcium 9.8  8.4 - 13.0 mg/dL   Total Protein 7.2  6.0 - 8.3 g/dL   Albumin 3.7  3.5 - 5.2 g/dL   AST 17  0 - 37 U/L   ALT 8  0 - 35 U/L   Alkaline Phosphatase 82  39 - 117 U/L   Total Bilirubin 0.4  0.3 - 1.2 mg/dL   GFR calc non Af Amer 82 (*) >90 mL/min   GFR calc Af Amer >90  >90 mL/min   Dg Chest 2 View  03/21/2012  *RADIOLOGY REPORT*  Clinical Data: Pneumonia, increasing shortness of breath, weakness  CHEST - 2 VIEW  Comparison: 03/18/2012; 10/26/2011; 09/13/2011; 08/23/2009 chest CT - 01/29/2012  Findings:  Grossly unchanged cardiac silhouette and mediastinal contours with mild ectasia and tortuosity of the thoracic aorta.  Scattered bilateral peripheral predominant fine and coarse reticular opacities, left greater than right, are grossly unchanged.  No focal new focal airspace opacities.  No definite pleural effusion or pneumothorax.  Surgical clips overlie the left thoracic inlet. Mild scoliotic curvature of the thoracolumbar spine.  IMPRESSION: Grossly unchanged bilateral peripheral predominant fine and coarse reticular opacities, left greater than right, presumably secondary to multifocal infection, including atypicals,  though as was stated on recent chest CT, a follow-up chest CT scan in 4 to 6 months is recommended to ensure resolution.   Original Report Authenticated By: Tacey Ruiz, MD       1. CAP (community acquired pneumonia)       MDM  71 year old female with pneumonia.  PCP sent her to the ED to admitted for failed outpatient therapy on Levaquin.  ED treatments: 1. Ceftriaxone 2. Azithromycin 3. Potassium 40 PO  This patient has been seen by and discussed with Dr. Ethelda Chick.  Patient is being admitted to the hospital.        Roxy Horseman, PA-C 03/21/12 2013

## 2012-03-21 NOTE — ED Notes (Signed)
Pt in x-ray at this time

## 2012-03-22 DIAGNOSIS — Z79899 Other long term (current) drug therapy: Secondary | ICD-10-CM | POA: Diagnosis not present

## 2012-03-22 DIAGNOSIS — E785 Hyperlipidemia, unspecified: Secondary | ICD-10-CM | POA: Diagnosis present

## 2012-03-22 DIAGNOSIS — Z8701 Personal history of pneumonia (recurrent): Secondary | ICD-10-CM | POA: Diagnosis not present

## 2012-03-22 DIAGNOSIS — Z853 Personal history of malignant neoplasm of breast: Secondary | ICD-10-CM | POA: Diagnosis not present

## 2012-03-22 DIAGNOSIS — I10 Essential (primary) hypertension: Secondary | ICD-10-CM | POA: Diagnosis present

## 2012-03-22 DIAGNOSIS — E7800 Pure hypercholesterolemia, unspecified: Secondary | ICD-10-CM | POA: Diagnosis present

## 2012-03-22 DIAGNOSIS — Z90710 Acquired absence of both cervix and uterus: Secondary | ICD-10-CM | POA: Diagnosis not present

## 2012-03-22 DIAGNOSIS — K219 Gastro-esophageal reflux disease without esophagitis: Secondary | ICD-10-CM | POA: Diagnosis present

## 2012-03-22 DIAGNOSIS — M129 Arthropathy, unspecified: Secondary | ICD-10-CM | POA: Diagnosis present

## 2012-03-22 DIAGNOSIS — E876 Hypokalemia: Secondary | ICD-10-CM | POA: Diagnosis not present

## 2012-03-22 DIAGNOSIS — I2510 Atherosclerotic heart disease of native coronary artery without angina pectoris: Secondary | ICD-10-CM | POA: Diagnosis present

## 2012-03-22 DIAGNOSIS — Z23 Encounter for immunization: Secondary | ICD-10-CM | POA: Diagnosis not present

## 2012-03-22 DIAGNOSIS — F411 Generalized anxiety disorder: Secondary | ICD-10-CM | POA: Diagnosis present

## 2012-03-22 DIAGNOSIS — F3289 Other specified depressive episodes: Secondary | ICD-10-CM | POA: Diagnosis present

## 2012-03-22 DIAGNOSIS — J189 Pneumonia, unspecified organism: Secondary | ICD-10-CM | POA: Diagnosis not present

## 2012-03-22 DIAGNOSIS — Z87891 Personal history of nicotine dependence: Secondary | ICD-10-CM | POA: Diagnosis not present

## 2012-03-22 LAB — COMPREHENSIVE METABOLIC PANEL
BUN: 8 mg/dL (ref 6–23)
Calcium: 9.1 mg/dL (ref 8.4–10.5)
Creatinine, Ser: 0.68 mg/dL (ref 0.50–1.10)
GFR calc Af Amer: >90 mL/min (ref >90)
GFR calc non Af Amer: 85 mL/min — ABNORMAL LOW (ref >90)
Glucose, Bld: 102 mg/dL — ABNORMAL HIGH (ref 70–99)
Total Protein: 6.4 g/dL (ref 6.0–8.3)

## 2012-03-22 LAB — CBC WITH DIFFERENTIAL/PLATELET
Basophils Absolute: 0 10*3/uL (ref 0.0–0.1)
HCT: 34.9 % — ABNORMAL LOW (ref 36.0–46.0)
Hemoglobin: 12.3 g/dL (ref 12.0–15.0)
Lymphocytes Relative: 40 % (ref 12–46)
Monocytes Absolute: 0.6 10*3/uL (ref 0.1–1.0)
Monocytes Relative: 10 % (ref 3–12)
Neutro Abs: 2.7 10*3/uL (ref 1.7–7.7)
Neutrophils Relative %: 46 % (ref 43–77)
RDW: 12.4 % (ref 11.5–15.5)
WBC: 5.8 10*3/uL (ref 4.0–10.5)

## 2012-03-22 MED ORDER — INFLUENZA VIRUS VACC SPLIT PF IM SUSP
0.5000 mL | INTRAMUSCULAR | Status: AC
  Administered 2012-03-23: 0.5 mL via INTRAMUSCULAR
  Filled 2012-03-22: qty 0.5

## 2012-03-22 NOTE — Progress Notes (Signed)
Pt admitted to floor. Pt comes from Home and is AOx3. Pt is ambulatory with generalized weakness. Pt has no skin issues. Pt and family member oriented to unit, staff, and safety guidelines. Plan of care discussed with patient. Call bell light within reach, bed alarm on and bed is lowest position. Will continue to monitor.

## 2012-03-22 NOTE — ED Provider Notes (Signed)
Medical screening examination/treatment/procedure(s) were conducted as a shared visit with non-physician practitioner(s) and myself.  I personally evaluated the patient during the encounter  Doug Sou, MD 03/22/12 0200

## 2012-03-22 NOTE — Progress Notes (Addendum)
[facility]             Progress Note   Subjective: Still with dry cough, feels a little stronger  Objective: Vital signs in last 24 hours: Temp:  [97.9 F (36.6 C)-98.2 F (36.8 C)] 98.2 F (36.8 C) (11/09 0510) Pulse Rate:  [62-79] 66  (11/09 0510) Resp:  [18] 18  (11/09 0510) BP: (108-131)/(66-78) 108/66 mmHg (11/09 0510) SpO2:  [94 %-100 %] 94 % (11/09 0510) FiO2 (%):  [21 %] 21 % (11/08 2158) Weight:  [68.9 kg (151 lb 14.4 oz)] 68.9 kg (151 lb 14.4 oz) (11/08 2123) Weight change:  Last BM Date: 03/20/12  Intake/Output from previous day: 11/08 0701 - 11/09 0700 In: 1208.8 [P.O.:240; I.V.:668.8; IV Piggyback:300] Out: 775 [Urine:775]     Physical Exam: General: Alert, awake, oriented x3, in no acute distress. HEENT: No bruits, no goiter. Heart: Regular rate and rhythm, without murmurs, rubs, gallops. Lungs: fine basilar crackles Abdomen: Soft, nontender, nondistended, positive bowel sounds. Extremities: No clubbing cyanosis or edema with positive pedal pulses. Neuro: Grossly intact, nonfocal.    Lab Results: Basic Metabolic Panel:  Basename 03/22/12 0605 03/21/12 1815  NA 141 140  K 3.4* 2.8*  CL 105 101  CO2 25 24  GLUCOSE 102* 110*  BUN 8 10  CREATININE 0.68 0.76  CALCIUM 9.1 9.8  MG -- --  PHOS -- --   Liver Function Tests:  [facility] 03/22/12 0605 03/21/12 1815  AST 16 17  ALT 8 8  ALKPHOS 69 82  BILITOT 0.4 0.4  PROT 6.4 7.2  ALBUMIN 3.2* 3.7   No results found for this basename: LIPASE:2,AMYLASE:2 in the last 72 hours No results found for this basename: AMMONIA:2 in the last 72 hours CBC:  Basename 03/22/12 0605 03/21/12 1815  WBC 5.8 9.0  NEUTROABS 2.7 6.5  HGB 12.3 13.8  HCT 34.9* 38.5  MCV 85.1 83.7  PLT 237 253   Cardiac Enzymes: No results found for this basename: CKTOTAL:3,CKMB:3,CKMBINDEX:3,TROPONINI:3 in the last 72 hours BNP: No results found for this basename: PROBNP:3 in the last 72 hours D-Dimer: No  results found for this basename: DDIMER:2 in the last 72 hours CBG: No results found for this basename: GLUCAP:6 in the last 72 hours Hemoglobin A1C: No results found for this basename: HGBA1C in the last 72 hours Fasting Lipid Panel: No results found for this basename: CHOL,HDL,LDLCALC,TRIG,CHOLHDL,LDLDIRECT in the last 72 hours Thyroid Function Tests: No results found for this basename: TSH,T4TOTAL,FREET4,T3FREE,THYROIDAB in the last 72 hours Anemia Panel: No results found for this basename: VITAMINB12,FOLATE,FERRITIN,TIBC,IRON,RETICCTPCT in the last 72 hours Coagulation: No results found for this basename: LABPROT:2,INR:2 in the last 72 hours Urine Drug Screen: Drugs of Abuse  No results found for this basename: labopia, cocainscrnur, labbenz, amphetmu, thcu, labbarb    Alcohol Level: No results found for this basename: ETH:2 in the last 72 hours Urinalysis: No results found for this basename: COLORURINE:2,APPERANCEUR:2,LABSPEC:2,PHURINE:2,GLUCOSEU:2,HGBUR:2,BILIRUBINUR:2,KETONESUR:2,PROTEINUR:2,UROBILINOGEN:2,NITRITE:2,LEUKOCYTESUR:2 in the last 72 hours  No results found for this or any previous visit (from the past 240 hour(s)).  Studies/Results: Dg Chest 2 View  03/21/2012  *RADIOLOGY REPORT*  Clinical Data: Pneumonia, increasing shortness of breath, weakness  CHEST - 2 VIEW  Comparison: 03/18/2012; 10/26/2011; 09/13/2011; 08/23/2009 chest CT - 01/29/2012  Findings:  Grossly unchanged cardiac silhouette and mediastinal contours with mild ectasia and tortuosity of the thoracic aorta.  Scattered bilateral peripheral predominant fine and coarse reticular opacities, left greater than right, are grossly unchanged.  No focal new focal airspace opacities.  No definite pleural effusion or pneumothorax.  Surgical clips overlie the left thoracic inlet. Mild scoliotic curvature of the thoracolumbar spine.  IMPRESSION: Grossly unchanged bilateral peripheral predominant fine and coarse reticular  opacities, left greater than right, presumably secondary to multifocal infection, including atypicals, though as was stated on recent chest CT, a follow-up chest CT scan in 4 to 6 months is recommended to ensure resolution.   Original Report Authenticated By: Tacey Ruiz, MD     Medications: Scheduled Meds:   . aspirin  81 mg Oral Daily  . atorvastatin  40 mg Oral q1800  . azithromycin  500 mg Intravenous Q24H  . [COMPLETED] azithromycin  1,000 mg Oral Once  . [COMPLETED] cefTRIAXone (ROCEPHIN)  IV  1 g Intravenous Once  . cefTRIAXone (ROCEPHIN)  IV  1 g Intravenous Q24H  . enoxaparin  40 mg Subcutaneous Q24H  . escitalopram  10 mg Oral Daily  . influenza  inactive virus vaccine  0.5 mL Intramuscular Tomorrow-1000  . metoprolol succinate  50 mg Oral Daily  . [COMPLETED] potassium chloride  40 mEq Oral Once  . [COMPLETED] sodium chloride  1,000 mL Intravenous Once  . [DISCONTINUED] cefTRIAXone (ROCEPHIN) IM  1 g Intramuscular Once  . [DISCONTINUED] enoxaparin  40 mg Subcutaneous Q24H   Continuous Infusions:   . sodium chloride 75 mL/hr at 03/22/12 0749   PRN Meds:.albuterol, ALPRAZolam  Assessment/Plan: 1. Community acquired pneumonia -symptoms suggestive of this although CXR not significantly changed from prior imaging  Continue Rocephin and azithro  Fu  sputum cultures, FU Urine legionella and streptococcal Ag b  2. Ground glass opacities in both lower lungs: ? Interstitial lung disease or may be sequelae of previous infection Fu with pulmonary  3. . Hypokalemia - will replace   DVT proph: lovenox FULL code Dispo: home in 24-48H    Time spent coordinating care:   LOS: 1 day   [facility] Pager: [phone] 03/22/2012, 8:37 AM

## 2012-03-23 DIAGNOSIS — E876 Hypokalemia: Secondary | ICD-10-CM | POA: Diagnosis not present

## 2012-03-23 DIAGNOSIS — J189 Pneumonia, unspecified organism: Secondary | ICD-10-CM | POA: Diagnosis not present

## 2012-03-23 LAB — LEGIONELLA ANTIGEN, URINE: Legionella Antigen, Urine: NEGATIVE

## 2012-03-23 MED ORDER — ACETAMINOPHEN 325 MG PO TABS
650.0000 mg | ORAL_TABLET | Freq: Four times a day (QID) | ORAL | Status: DC | PRN
  Administered 2012-03-23: 650 mg via ORAL
  Filled 2012-03-23: qty 2

## 2012-03-23 MED ORDER — LEVALBUTEROL HCL 0.63 MG/3ML IN NEBU
0.6300 mg | INHALATION_SOLUTION | Freq: Four times a day (QID) | RESPIRATORY_TRACT | Status: DC
  Administered 2012-03-23 – 2012-03-24 (×2): 0.63 mg via RESPIRATORY_TRACT
  Filled 2012-03-23 (×5): qty 3

## 2012-03-23 MED ORDER — ONDANSETRON HCL 4 MG/2ML IJ SOLN
4.0000 mg | Freq: Four times a day (QID) | INTRAMUSCULAR | Status: DC | PRN
  Administered 2012-03-23 – 2012-03-24 (×2): 4 mg via INTRAVENOUS
  Filled 2012-03-23 (×2): qty 2

## 2012-03-23 MED ORDER — PANTOPRAZOLE SODIUM 40 MG PO TBEC
40.0000 mg | DELAYED_RELEASE_TABLET | Freq: Every day | ORAL | Status: DC
  Administered 2012-03-23 – 2012-03-24 (×2): 40 mg via ORAL
  Filled 2012-03-23 (×2): qty 1

## 2012-03-23 MED ORDER — ALBUTEROL SULFATE (5 MG/ML) 0.5% IN NEBU
INHALATION_SOLUTION | RESPIRATORY_TRACT | Status: AC
  Filled 2012-03-23: qty 0.5

## 2012-03-23 MED ORDER — LEVALBUTEROL HCL 0.63 MG/3ML IN NEBU
0.6300 mg | INHALATION_SOLUTION | RESPIRATORY_TRACT | Status: DC | PRN
  Administered 2012-03-23: 0.63 mg via RESPIRATORY_TRACT
  Filled 2012-03-23 (×2): qty 3

## 2012-03-23 MED ORDER — GUAIFENESIN 100 MG/5ML PO SOLN
5.0000 mL | ORAL | Status: DC | PRN
  Administered 2012-03-23 (×2): 100 mg via ORAL
  Filled 2012-03-23 (×3): qty 5

## 2012-03-23 MED ORDER — BENZONATATE 100 MG PO CAPS
100.0000 mg | ORAL_CAPSULE | Freq: Two times a day (BID) | ORAL | Status: DC | PRN
  Administered 2012-03-24: 100 mg via ORAL
  Filled 2012-03-23: qty 1

## 2012-03-23 MED ORDER — ALBUTEROL SULFATE (5 MG/ML) 0.5% IN NEBU: 2.5000 mg | INHALATION_SOLUTION | RESPIRATORY_TRACT | Status: DC | PRN

## 2012-03-23 NOTE — Progress Notes (Signed)
[facility]             Progress Note   Subjective: Had a coughing spell this morning and felt SOb from it, better now, still with mostly dry cough, occasional white phlegm  Objective: Vital signs in last 24 hours: Temp:  [97 F (36.1 C)-98.2 F (36.8 C)] 98.2 F (36.8 C) (11/10 0436) Pulse Rate:  [67-78] 78  (11/10 0636) Resp:  [18-22] 22  (11/10 0636) BP: (113-121)/(69-77) 113/69 mmHg (11/10 0436) SpO2:  [93 %-100 %] 100 % (11/10 0730) Weight:  [68.947 kg (152 lb)] 68.947 kg (152 lb) (11/09 2120) Weight change: 0.047 kg (1.7 oz) Last BM Date: 03/20/12  Intake/Output from previous day: 11/09 0701 - 11/10 0700 In: 2688.8 [P.O.:960; I.V.:1428.8; IV Piggyback:300] Out: 2900 [Urine:2900]     Physical Exam: General: Alert, awake, oriented x3, in no acute distress. HEENT: No bruits, no goiter. Heart: Regular rate and rhythm, without murmurs, rubs, gallops. Lungs: fine basilar crackles, L>R Abdomen: Soft, nontender, nondistended, positive bowel sounds. Extremities: No clubbing cyanosis or edema with positive pedal pulses. Neuro: Grossly intact, nonfocal.    Lab Results: Basic Metabolic Panel:  Basename 03/22/12 0605 03/21/12 1815  NA 141 140  K 3.4* 2.8*  CL 105 101  CO2 25 24  GLUCOSE 102* 110*  BUN 8 10  CREATININE 0.68 0.76  CALCIUM 9.1 9.8  MG -- --  PHOS -- --   Liver Function Tests:  [facility] 03/22/12 0605 03/21/12 1815  AST 16 17  ALT 8 8  ALKPHOS 69 82  BILITOT 0.4 0.4  PROT 6.4 7.2  ALBUMIN 3.2* 3.7   No results found for this basename: LIPASE:2,AMYLASE:2 in the last 72 hours No results found for this basename: AMMONIA:2 in the last 72 hours CBC:  Basename 03/22/12 0605 03/21/12 1815  WBC 5.8 9.0  NEUTROABS 2.7 6.5  HGB 12.3 13.8  HCT 34.9* 38.5  MCV 85.1 83.7  PLT 237 253   Cardiac Enzymes: No results found for this basename: CKTOTAL:3,CKMB:3,CKMBINDEX:3,TROPONINI:3 in the last 72 hours BNP: No results found for this  basename: PROBNP:3 in the last 72 hours D-Dimer: No results found for this basename: DDIMER:2 in the last 72 hours CBG: No results found for this basename: GLUCAP:6 in the last 72 hours Hemoglobin A1C: No results found for this basename: HGBA1C in the last 72 hours Fasting Lipid Panel: No results found for this basename: CHOL,HDL,LDLCALC,TRIG,CHOLHDL,LDLDIRECT in the last 72 hours Thyroid Function Tests: No results found for this basename: TSH,T4TOTAL,FREET4,T3FREE,THYROIDAB in the last 72 hours Anemia Panel: No results found for this basename: VITAMINB12,FOLATE,FERRITIN,TIBC,IRON,RETICCTPCT in the last 72 hours Coagulation: No results found for this basename: LABPROT:2,INR:2 in the last 72 hours Urine Drug Screen: Drugs of Abuse  No results found for this basename: labopia,  cocainscrnur,  labbenz,  amphetmu,  thcu,  labbarb    Alcohol Level: No results found for this basename: ETH:2 in the last 72 hours Urinalysis: No results found for this basename: COLORURINE:2,APPERANCEUR:2,LABSPEC:2,PHURINE:2,GLUCOSEU:2,HGBUR:2,BILIRUBINUR:2,KETONESUR:2,PROTEINUR:2,UROBILINOGEN:2,NITRITE:2,LEUKOCYTESUR:2 in the last 72 hours  No results found for this or any previous visit (from the past 240 hour(s)).  Studies/Results: Dg Chest 2 View  03/21/2012  *RADIOLOGY REPORT*  Clinical Data: Pneumonia, increasing shortness of breath, weakness  CHEST - 2 VIEW  Comparison: 03/18/2012; 10/26/2011; 09/13/2011; 08/23/2009 chest CT - 01/29/2012  Findings:  Grossly unchanged cardiac silhouette and mediastinal contours with mild ectasia and tortuosity of the thoracic aorta.  Scattered bilateral peripheral predominant fine and coarse reticular opacities, left greater than right, are  grossly unchanged.  No focal new focal airspace opacities.  No definite pleural effusion or pneumothorax.  Surgical clips overlie the left thoracic inlet. Mild scoliotic curvature of the thoracolumbar spine.  IMPRESSION: Grossly unchanged  bilateral peripheral predominant fine and coarse reticular opacities, left greater than right, presumably secondary to multifocal infection, including atypicals, though as was stated on recent chest CT, a follow-up chest CT scan in 4 to 6 months is recommended to ensure resolution.   Original Report Authenticated By: Tacey Ruiz, MD     Medications: Scheduled Meds:    . albuterol      . aspirin  81 mg Oral Daily  . atorvastatin  40 mg Oral q1800  . azithromycin  500 mg Intravenous Q24H  . cefTRIAXone (ROCEPHIN)  IV  1 g Intravenous Q24H  . enoxaparin  40 mg Subcutaneous Q24H  . escitalopram  10 mg Oral Daily  . influenza  inactive virus vaccine  0.5 mL Intramuscular Tomorrow-1000  . levalbuterol  0.63 mg Nebulization Q6H  . metoprolol succinate  50 mg Oral Daily  . pantoprazole  40 mg Oral Q1200   Continuous Infusions:    . sodium chloride 75 mL/hr at 03/22/12 2102   PRN Meds:.ALPRAZolam, benzonatate, guaiFENesin, levalbuterol, ondansetron, [DISCONTINUED] albuterol, [DISCONTINUED] albuterol  Assessment/Plan: 1. Community acquired pneumonia - symptoms suggestive of this although CXR not significantly changed from prior imaging With reactive airway disease  Continue Rocephin and azithro , xopenex nebs Fu  sputum cultures, FU Urine legionella and streptococcal Ag negative Add tessalon perles  2. Ground glass opacities in both lower lungs: ? Interstitial lung disease or may be sequelae of previous infection Fu with pulmonary, will need PFTs  3. . Hypokalemia - will replace   DVT proph: lovenox FULL code Dispo: home in 24-48H  Ambulate, DC Tele  Time spent coordinating care:   LOS: 2 days   [facility] Pager: [phone] 03/23/2012, 8:19 AM

## 2012-03-23 NOTE — Progress Notes (Signed)
At approximately 0615 03/23/2012, patient c/o severe SOB and feeling like she was choking.  VS stable.  Resp -22, 98% RA, HR-78.  She is also c/o nausea and coughing.  Unable to take Albuterol due to severe jitteriness and elevated heart rate.  Also, respiratory requested order for Flutter Valve.  Rec'd order from MD for Xoponex, IV Zofran, Robitussin, and flutter valve.  Xoponex administered by Respiratory.  IV Zofran and Robitussin given at 0752.  Will continue to monitor patient.  Adyson Vanburen, Justine Null.

## 2012-03-24 DIAGNOSIS — E876 Hypokalemia: Secondary | ICD-10-CM | POA: Diagnosis not present

## 2012-03-24 DIAGNOSIS — J189 Pneumonia, unspecified organism: Secondary | ICD-10-CM | POA: Diagnosis not present

## 2012-03-24 MED ORDER — LEVALBUTEROL HCL 0.63 MG/3ML IN NEBU
0.6300 mg | INHALATION_SOLUTION | Freq: Three times a day (TID) | RESPIRATORY_TRACT | Status: DC
  Administered 2012-03-24: 0.63 mg via RESPIRATORY_TRACT
  Filled 2012-03-24 (×4): qty 3

## 2012-03-24 MED ORDER — BENZONATATE 100 MG PO CAPS: 100.0000 mg | ORAL_CAPSULE | Freq: Two times a day (BID) | ORAL | Status: DC | PRN

## 2012-03-24 MED ORDER — MOXIFLOXACIN HCL 400 MG PO TABS: 400.0000 mg | ORAL_TABLET | Freq: Every day | ORAL | Status: DC

## 2012-03-24 NOTE — Plan of Care (Signed)
Problem: Phase I Progression Outcomes Goal: Consider pulmonary consult Outcome: Not Applicable Date Met:  03/24/12 Will need on discharge - needs PFT

## 2012-03-24 NOTE — Progress Notes (Signed)
Discharge Note: I have reviewed the discharge instructions with this patient, including: follow up appointments, prescriptions, medications to continue at home, dietary control of glucose levels, how to respond in an emergency situation, the importance of good hygiene, community resources, and how to access MyChart information.  Follow up appointment: Patient will be calling office after discharge to confirm appointments are made.  Patient has transportation and understands the importance of this follow up visit.  Prescriptions: The patient will be picking up new prescriptions (Benzonatate & Moxifloxacin) on the way home after discharge.  She will be driven by Misty Stanley (daughter-in-law) to pick up prescription. There are no financial barriers to obtaining these medications. Patient understands the importance of finishing the entire prescription as prescribed.  Medications to continue at home:  Patient understands to continue prescribed medications at home following directions on prescription bottles. Patient does not have any questions about these medications.  Dietary control of glucose:  Taught patient about maintaining a healthy glucose level. She understands her risk factors and is wanting to make healthy diet choices. She will ask for her glucose level to be checked on routine check-ups.  Emergency: Patient understands that if she has any complications upon discharged or feels like something is "just not right," to call 911.   Hygiene: Patient was informed of the importance of good hand hygiene to stop the spread of harmful organisms. She will make sure to cover her mouth when coughing or sneezing, wash hands regularly, clean off contaminated surfaces.  Community resources:  Patient has a list of possible community resources that she can contact if needed.  Patient said that she does not think she will ever have to use them.   MyChart: Patient is aware of how to access MyChart information  and looks forward to using this resource.  Patient will be pick up by her daughter-in-law Misty Stanley) and looks forward to returning home.  [place] [facility] [place]

## 2012-03-24 NOTE — Progress Notes (Signed)
Pt.m received d/c instructions,IVm was removed.pt. ready to go home.

## 2012-04-04 DIAGNOSIS — J189 Pneumonia, unspecified organism: Secondary | ICD-10-CM | POA: Diagnosis not present

## 2012-04-04 DIAGNOSIS — Z23 Encounter for immunization: Secondary | ICD-10-CM | POA: Diagnosis not present

## 2012-04-04 DIAGNOSIS — F411 Generalized anxiety disorder: Secondary | ICD-10-CM | POA: Diagnosis not present

## 2012-04-14 ENCOUNTER — Ambulatory Visit (INDEPENDENT_AMBULATORY_CARE_PROVIDER_SITE_OTHER): Payer: Medicare Other | Admitting: Emergency Medicine

## 2012-04-14 ENCOUNTER — Encounter: Payer: Self-pay | Admitting: Emergency Medicine

## 2012-04-14 VITALS — BP 116/82 | HR 68 | Temp 97.3°F | Ht 61.0 in | Wt 147.8 lb

## 2012-04-14 DIAGNOSIS — R05 Cough: Secondary | ICD-10-CM

## 2012-04-14 DIAGNOSIS — J8489 Other specified interstitial pulmonary diseases: Secondary | ICD-10-CM | POA: Insufficient documentation

## 2012-04-14 DIAGNOSIS — J189 Pneumonia, unspecified organism: Secondary | ICD-10-CM | POA: Insufficient documentation

## 2012-04-14 DIAGNOSIS — R053 Chronic cough: Secondary | ICD-10-CM | POA: Insufficient documentation

## 2012-04-14 DIAGNOSIS — R918 Other nonspecific abnormal finding of lung field: Secondary | ICD-10-CM | POA: Diagnosis not present

## 2012-04-14 MED ORDER — LORATADINE 10 MG PO TABS: 10.0000 mg | ORAL_TABLET | Freq: Every day | ORAL | Status: DC

## 2012-04-14 MED ORDER — FLUTICASONE PROPIONATE 50 MCG/ACT NA SUSP: 2.0000 | Freq: Two times a day (BID) | NASAL | Status: DC

## 2012-04-14 NOTE — Assessment & Plan Note (Signed)
Suspect a chronic component being drivem by rhinitis. Consider also GERD but nothing to support in hx - start loratadine - start fluticasone spray - consider empiric gerd rx next time

## 2012-04-14 NOTE — Patient Instructions (Addendum)
We will perform a repeat CT scan of the chest  Please start loratadine 10mg  daily until next visit (Claritin) Please start fluticasone nasal spray, 2 sprays each side twice a day, until next visit Follow with Dr Delton Coombes in 1 month

## 2012-04-14 NOTE — Progress Notes (Signed)
Subjective:    Patient ID: Tiffany Kane, female    DOB: October 24, 1940, 71 y.o.   MRN: [id number]  HPI 71 yo remote smoker (3 pk-yrs) with hx HTN, ? Breast CA (with negative post-op path after positive needle bx 2002), allergies and sinus disease. She is referred for eval by Dr Gerri Spore for cough and abnormal CXR/CT scan. She was hospitalized early Nov '13 for apparent CAP - experienced fatigue, cough productive of , loss of appetite, pain with cough. She is improved, still with a hacky cough, happens w exertion or with a lot of talking. She has a hx of CT scan chest from 09/2011 and 9/13 that show scattered multilobar small GG opacities, stable on serial exams. Prompted by abnormal CXR in 09/2011 (for cough and ? PNA). She is clinically better than hospital but not back to her baseline of mid-October. She has nasal gtt and sinus fullness. Rarely has GERD sx.    Review of Systems  Constitutional: Positive for fatigue. Negative for fever and unexpected weight change.  HENT: Positive for ear pain and congestion. Negative for nosebleeds, sore throat, rhinorrhea, sneezing, trouble swallowing, dental problem, postnasal drip and sinus pressure.   Eyes: Negative for redness and itching.  Respiratory: Positive for cough and shortness of breath. Negative for chest tightness and wheezing.   Cardiovascular: Negative for palpitations and leg swelling.  Gastrointestinal: Negative for nausea and vomiting.  Genitourinary: Negative for dysuria.  Musculoskeletal: Negative for joint swelling.  Skin: Negative for rash.  Neurological: Positive for light-headedness. Negative for headaches.  Hematological: Does not bruise/bleed easily.  Psychiatric/Behavioral: Negative for dysphoric mood. The patient is not nervous/anxious.     Past Medical History  Diagnosis Date  . Hypertension   . Hyperlipemia   . Depression   . Anxiety   . High cholesterol   . Coronary artery disease   . Esophageal reflux   .  Arthritis   . Ascites   . Pneumonia   . Thyromegaly   . Compression fracture     T-11  . Thoracic aneurysm   . Bronchitis   . Breast cancer 2002  . Hyperlipidemia      Family History  Problem Relation Age of Onset  . Breast cancer Mother   . Diabetes Mother   . Cancer - Other Father     prostate  . Heart disease Brother   . Prostate cancer Brother      History   Social History  . Marital Status: Widowed    Spouse Name: N/A    Number of Children: 2  . Years of Education: N/A   Occupational History  . retired    Social History Main Topics  . Smoking status: Former Smoker -- 0.5 packs/day for 6 years    Types: Cigarettes    Quit date: 05/14/1992  . Smokeless tobacco: Never Used  . Alcohol Use: No  . Drug Use: No  . Sexually Active: No   Other Topics Concern  . Not on file   Social History Narrative  . 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  Has lived in [place] She moved into mobile home (refurbished) 8 yrs ago. No evidence water damage.  Owns cats, never birds.  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 albuterol (PROVENTIL HFA;VENTOLIN HFA) 108 (90 BASE) MCG/ACT inhaler Inhale 2 puffs into the  lungs every 4 (four) hours as needed. For shortness of breath      . ALPRAZolam (XANAX) 0.5 MG tablet Take 0.5 mg by mouth Twice daily as needed. For anxiety      . aspirin 81 MG chewable tablet Chew 81 mg by mouth daily.      . benzonatate (TESSALON) 100 MG capsule Take 1 capsule (100 mg total) by mouth 2 (two) times daily as needed for cough.  10 capsule  0  . CALCIUM PO Take 1 tablet by mouth daily.      . Cholecalciferol (VITAMIN D3) 2000 UNITS capsule Take 2,000 Units by mouth daily.      Marland Kitchen escitalopram (LEXAPRO) 10 MG tablet Take 10 mg by mouth daily.      . fish oil-omega-3 fatty acids 1000 MG capsule Take 1 g by mouth daily.      Marland Kitchen ibuprofen  (ADVIL,MOTRIN) 200 MG tablet Take 200 mg by mouth every 6 (six) hours as needed. For pain      . metoprolol succinate (TOPROL-XL) 50 MG 24 hr tablet Take 50 mg by mouth daily. Take with or immediately following a meal.      . simvastatin (ZOCOR) 80 MG tablet Take 40 mg by mouth at bedtime.      . triamterene-hydrochlorothiazide (MAXZIDE-25) 37.5-25 MG per tablet Take 1 tablet by mouth daily.      . [DISCONTINUED] moxifloxacin (AVELOX) 400 MG tablet Take 1 tablet (400 mg total) by mouth daily. For 4 day  4 tablet  0   Last reviewed on 04/14/2012 10:54 AM by Lazarus Salines, RN       Objective:   Physical Exam Filed Vitals:   04/14/12 1054  BP: 116/82  Pulse: 68  Temp: 97.3 F (36.3 C)   Gen: Pleasant, well-nourished, in no distress,  normal affect  ENT: No lesions,  mouth clear,  oropharynx clear, no postnasal drip  Neck: No JVD, no TMG, no carotid bruits  Lungs: No use of accessory muscles, clear without rales or rhonchi  Cardiovascular: RRR, heart sounds normal, no murmur or gallops, no peripheral edema  Musculoskeletal: No deformities, no cyanosis or clubbing  Neuro: alert, non focal  Skin: Warm, no lesions or rashes    CT chest 01/29/12 --  Comparison: CT angio chest of 10/26/2011 and CT chest of  09/18/2011, and CT angio chest of 03/02/2008  Findings: All of the foci of ground-glass opacity described on the  CTs of 06/14 and 09/18/2011 are unchanged. These foci of ground-  glass opacity were not present on the CT from 2009. These areas  could represent scarring from sequela of prior pneumonia, but a  follow-up is recommended to exclude multicentric low grade  adenocarcinoma. A CT of the chest is recommended in the next 4-6  months to assess stability of these ground-glass opacities. The  only ground-glass opacity that may be new is within the mid right  upper lobe on image number 24. No pleural effusion is noted.  On soft tissue window images, asymmetric  enlargement of the right  lobe of thyroid again is noted. Surgical clips overlie the left  superior paravertebral region. On this unenhanced study no  mediastinal or hilar adenopathy is seen. The ascending aorta is  slightly prominent measuring 4.4 cm in maximum transverse diameter.  Coronary artery calcifications are present. The upper abdomen is  unremarkable. The partially compressed T11 vertebral body appears  stable.  IMPRESSION:  1. No significant change in multiple ground-glass opacities  with a  probable new ground-glass opacity within the right upper lobe.  Although these could represent sequelae of multifocal pneumonia,  low grade adenocarcinoma cannot be excluded and follow-up CT the  chest is recommended in the next 4-6 months.  2. Asymmetric enlargement of the right lobe of thyroid.  3. Prominent ascending aorta measuring 4.4 cm.  4. Coronary artery calcifications.  5. Stable compression deformity of T11 vertebral body.  Original Report Authenticated By: Juline Patch, M.D.      Assessment & Plan:  Bilateral pulmonary infiltrates on CXR B GGI on CT scan chest, etiology unclear. Nothing in hx to support HSP. They were stable on Ct scan in 9/13, but she has since been admitted to hospital with pulm sx. I will repeat the scan now then decide re: any further eval (? FOB, bx's, repeat scans).  - CT scan chest - rov 1 month to review  Chronic cough Suspect a chronic component being drivem by rhinitis. Consider also GERD but nothing to support in hx - start loratadine - start fluticasone spray - consider empiric gerd rx next time

## 2012-04-14 NOTE — Assessment & Plan Note (Signed)
B GGI on CT scan chest, etiology unclear. Nothing in hx to support HSP. They were stable on Ct scan in 9/13, but she has since been admitted to hospital with pulm sx. I will repeat the scan now then decide re: any further eval (? FOB, bx's, repeat scans).  - CT scan chest - rov 1 month to review

## 2012-04-15 ENCOUNTER — Other Ambulatory Visit: Payer: Medicare Other

## 2012-04-21 ENCOUNTER — Other Ambulatory Visit: Payer: Medicare Other

## 2012-04-24 NOTE — Discharge Summary (Signed)
Physician Discharge Summary  Patient ID: Tiffany Kane MRN: [id number] DOB/AGE: January 07, 1941 71 y.o.  Admit date: 03/21/2012 Discharge date: 04/24/2012  Primary Care Physician:  Elie Confer, MD   Discharge Diagnoses:    Principal Problem:  *Community acquired pneumonia Active Problems:  Hypokalemia      Medication List     As of 04/24/2012  5:10 PM    STOP taking these medications         levofloxacin 500 MG tablet   Commonly known as: LEVAQUIN      TAKE these medications         albuterol 108 (90 BASE) MCG/ACT inhaler   Commonly known as: PROVENTIL HFA;VENTOLIN HFA   Inhale 2 puffs into the lungs every 4 (four) hours as needed. For shortness of breath      ALPRAZolam 0.5 MG tablet   Commonly known as: XANAX   Take 0.5 mg by mouth Twice daily as needed. For anxiety      aspirin 81 MG chewable tablet   Chew 81 mg by mouth daily.      benzonatate 100 MG capsule   Commonly known as: TESSALON   Take 1 capsule (100 mg total) by mouth 2 (two) times daily as needed for cough.      CALCIUM PO   Take 1 tablet by mouth daily.      escitalopram 10 MG tablet   Commonly known as: LEXAPRO   Take 10 mg by mouth daily.      fish oil-omega-3 fatty acids 1000 MG capsule   Take 1 g by mouth daily.      ibuprofen 200 MG tablet   Commonly known as: ADVIL,MOTRIN   Take 200 mg by mouth every 6 (six) hours as needed. For pain      metoprolol succinate 50 MG 24 hr tablet   Commonly known as: TOPROL-XL   Take 50 mg by mouth daily. Take with or immediately following a meal.      simvastatin 80 MG tablet   Commonly known as: ZOCOR   Take 40 mg by mouth at bedtime.      triamterene-hydrochlorothiazide 37.5-25 MG per tablet   Commonly known as: MAXZIDE-25   Take 1 tablet by mouth daily.      Vitamin D3 2000 UNITS capsule   Take 2,000 Units by mouth daily.         Disposition and Follow-up:  PCP in 1 week  Significant Diagnostic Studies:  No results  found.  Brief H and P: Tiffany Kane is a 71 y.o. female has a past medical history of Hypertension; Hyperlipemia; Depression; Anxiety; High cholesterol; Coronary artery disease; Esophageal reflux; Arthritis; Ascites; Pneumonia; Thyromegaly; Compression fracture; Thoracic aneurysm; Bronchitis; and Breast cancer (2002). Presented with  She has been sick for about 1 week with cough that caused her chest to hurt. She went to PCP 3 days ago. She has been taking it for the past 3 days. She have had some low grade fever. She was seen by her PCP again today because she has Been getting weaker. She states that she have not eaten much lately due to lack of appetite.  She still has a cough and this morning she was coughing up some brownish mucus    Hospital Course:   1. Community acquired pneumonia - symptoms suggestive of this although CXR not significantly changed from prior imaging  With reactive airway disease  Treated  Rocephin and azithro then transitioned to oral antibiotics , xopenex  nebs  Fu sputum cultures,  FU Urine legionella and streptococcal Ag negative  Add tessalon perles  2. Ground glass opacities in both lower lungs:  ? Interstitial lung disease or may be sequelae of previous infection  Fu with pulmonary, will need PFTs   Time spent on Discharge:  Signed: Mahayla Haddaway [facility]  04/24/2012, 5:10 PM

## 2012-04-29 ENCOUNTER — Ambulatory Visit
Admission: RE | Admit: 2012-04-29 | Discharge: 2012-04-29 | Disposition: A | Discharge status: Home / Self Care | Payer: Medicare Other | Source: Ambulatory Visit | Attending: Interventional Cardiology | Admitting: Interventional Cardiology

## 2012-04-29 ENCOUNTER — Ambulatory Visit
Admission: RE | Admit: 2012-04-29 | Discharge: 2012-04-29 | Disposition: A | Discharge status: Home / Self Care | Payer: Medicare Other | Source: Ambulatory Visit | Attending: Emergency Medicine | Admitting: Emergency Medicine

## 2012-04-29 DIAGNOSIS — I712 Thoracic aortic aneurysm, without rupture: Secondary | ICD-10-CM

## 2012-04-29 DIAGNOSIS — I77810 Thoracic aortic ectasia: Secondary | ICD-10-CM | POA: Diagnosis not present

## 2012-04-29 DIAGNOSIS — R918 Other nonspecific abnormal finding of lung field: Secondary | ICD-10-CM | POA: Diagnosis not present

## 2012-04-29 MED ORDER — GADOBENATE DIMEGLUMINE 529 MG/ML IV SOLN
13.0000 mL | Freq: Once | INTRAVENOUS | Status: AC | PRN
  Administered 2012-04-29: 13 mL via INTRAVENOUS

## 2012-04-30 ENCOUNTER — Other Ambulatory Visit: Payer: Self-pay | Admitting: Interventional Cardiology

## 2012-04-30 DIAGNOSIS — I712 Thoracic aortic aneurysm, without rupture, unspecified: Secondary | ICD-10-CM

## 2012-05-20 ENCOUNTER — Encounter: Payer: Self-pay | Admitting: Emergency Medicine

## 2012-05-20 ENCOUNTER — Ambulatory Visit (INDEPENDENT_AMBULATORY_CARE_PROVIDER_SITE_OTHER): Payer: Medicare Other | Admitting: Emergency Medicine

## 2012-05-20 VITALS — BP 112/70 | HR 53 | Temp 97.4°F | Ht 61.5 in | Wt 148.8 lb

## 2012-05-20 DIAGNOSIS — R05 Cough: Secondary | ICD-10-CM

## 2012-05-20 DIAGNOSIS — R918 Other nonspecific abnormal finding of lung field: Secondary | ICD-10-CM | POA: Diagnosis not present

## 2012-05-20 NOTE — Progress Notes (Signed)
Subjective:    Patient ID: Tiffany Kane, female    DOB: 03/03/1941, 71 y.o.   MRN: [id number]  HPI 71 yo remote smoker (3 pk-yrs) with hx HTN, ? Breast CA (with negative post-op path after positive needle bx 2002), allergies and sinus disease. She is referred for eval by Dr Westermann for cough and abnormal CXR/CT scan. She was hospitalized early Nov '13 for apparent CAP - experienced fatigue, cough productive of , loss of appetite, pain with cough. She is improved, still with a hacky cough, happens w exertion or with a lot of talking. She has a hx of CT scan chest from 09/2011 and 9/13 that show scattered multilobar small GG opacities, stable on serial exams. Prompted by abnormal CXR in 09/2011 (for cough and ? PNA). She is clinically better than hospital but not back to her baseline of mid-October. She has nasal gtt and sinus fullness. Rarely has GERD sx.   ROV 05/20/12 -- returns to discuss cough and her abnormal CT scan chest. We decided to repeat the scan to look for interval change >> largely stable peripheral GG areas. She feels better - her cough is gone. We started allergy regimen last time; she stopped taking fluticasone spray due to jitteriness.      Objective:   Physical Exam Filed Vitals:   05/20/12 1347  BP: 112/70  Pulse: 53  Temp: 97.4 F (36.3 C)   Gen: Pleasant, well-nourished, in no distress,  normal affect  ENT: No lesions,  mouth clear,  oropharynx clear, no postnasal drip  Neck: No JVD, no TMG, no carotid bruits  Lungs: No use of accessory muscles, clear without rales or rhonchi  Cardiovascular: RRR, heart sounds normal, no murmur or gallops, no peripheral edema  Musculoskeletal: No deformities, no cyanosis or clubbing  Neuro: alert, non focal  Skin: Warm, no lesions or rashes   Ct scan chest 04/29/12 --  Comparison: Chest x-ray of 03/21/2012 and CT chest of 01/29/2012  Findings: On the lung window images, many of the previously  described ground-glass  opacities are again noted diffusely  throughout the lungs and are relatively stable. A few areas of  increased somewhat particularly within the left upper lobe and  right upper lobe. In addition, there is new ground-glass opacity  within the left upper lobe near the apex laterally. The majority of  these ground-glass opacities are peripheral in location. In view of  the peripheral location and the waxing and waning of some of these  opacities, a subacute / chronic process such as organizing  pneumonia would be a consideration. Other entities would be  nonspecific interstitial pneumonitis, usual interstitial  pneumonitis and eosinophilic pneumonia, all of which can be  subacute and chronic. Low grade adenocarcinoma remains in the  differential but entities such as organizing pneumonia would be  favored. Clinically correlate motion is recommended. No effusion  is seen. The central airway is patent.  On soft tissue window images, asymmetric enlargement of the right  lobe of thyroid is noted with apparent prior left thyroidectomy.  On this unenhanced study, no mediastinal or hilar adenopathy is  seen. Coronary artery calcifications are noted particularly in the  distribution of the left anterior descending artery. Prominence of  the ascending aorta is again noted measuring 41 mm in diameter.  Some contrast is noted within the renal collecting systems of  questionable significance.  IMPRESSION:  1. The majority of the previously described ground-glass opacities  are peripheral in location and stable although   there are new areas  of ground-glass opacity also present bilaterally. The primary  considerations are that of organizing pneumonia such as BOOP,  nonspecific interstitial pneumonitis, usual interstitial  pneumonitis, or possibly chronic eosinophilic pneumonia. Low grade  adenocarcinoma remains within the differential.  2. Coronary artery calcifications.      Assessment & Plan:    Bilateral pulmonary infiltrates on CXR GGI still present on CT chest 12/13, some new areas of involvement. Etiology unclear. Discussed options for f/u - in the end I believe it makes the most sense to pursue FOB with BAL and Bx's. She agrees.  - set up FOB 13:00 05/26/12 at [place] - rov in 1 month  Chronic cough Better - stop fluticasone spray, continue the loratadine    

## 2012-05-20 NOTE — Patient Instructions (Addendum)
We will perform bronchoscopy on 05/26/12 at 1:00 pm. You will need to show up 45 minutes early to [facility] Admitting. You will be contacted by the respiratory office at [facility] for more information Follow with Dr Delton Coombes in 1 month

## 2012-05-20 NOTE — Assessment & Plan Note (Signed)
GGI still present on CT chest 12/13, some new areas of involvement. Etiology unclear. Discussed options for f/u - in the end I believe it makes the most sense to pursue FOB with BAL and Bx's. She agrees.  - set up FOB 13:00 05/26/12 at [place] - rov in 1 month

## 2012-05-20 NOTE — Assessment & Plan Note (Signed)
Better - stop fluticasone spray, continue the loratadine

## 2012-05-26 ENCOUNTER — Ambulatory Visit (HOSPITAL_COMMUNITY): Payer: Medicare Other

## 2012-05-26 ENCOUNTER — Encounter (HOSPITAL_COMMUNITY): Payer: Self-pay | Admitting: Radiology

## 2012-05-26 ENCOUNTER — Encounter (HOSPITAL_COMMUNITY)
Admission: RE | Disposition: A | Discharge status: Home / Self Care | Payer: Self-pay | Source: Ambulatory Visit | Attending: Emergency Medicine

## 2012-05-26 ENCOUNTER — Ambulatory Visit (HOSPITAL_COMMUNITY)
Admission: RE | Admit: 2012-05-26 | Discharge: 2012-05-26 | Disposition: A | Discharge status: Home / Self Care | Payer: Medicare Other | Source: Ambulatory Visit | Attending: Emergency Medicine | Admitting: Emergency Medicine

## 2012-05-26 DIAGNOSIS — Z9889 Other specified postprocedural states: Secondary | ICD-10-CM | POA: Diagnosis not present

## 2012-05-26 DIAGNOSIS — J984 Other disorders of lung: Secondary | ICD-10-CM | POA: Diagnosis not present

## 2012-05-26 DIAGNOSIS — Z87891 Personal history of nicotine dependence: Secondary | ICD-10-CM | POA: Diagnosis not present

## 2012-05-26 DIAGNOSIS — I2510 Atherosclerotic heart disease of native coronary artery without angina pectoris: Secondary | ICD-10-CM | POA: Insufficient documentation

## 2012-05-26 DIAGNOSIS — R05 Cough: Secondary | ICD-10-CM | POA: Diagnosis not present

## 2012-05-26 DIAGNOSIS — D1430 Benign neoplasm of unspecified bronchus and lung: Secondary | ICD-10-CM | POA: Diagnosis not present

## 2012-05-26 DIAGNOSIS — R918 Other nonspecific abnormal finding of lung field: Secondary | ICD-10-CM | POA: Diagnosis not present

## 2012-05-26 DIAGNOSIS — R059 Cough, unspecified: Secondary | ICD-10-CM | POA: Diagnosis not present

## 2012-05-26 DIAGNOSIS — J3489 Other specified disorders of nose and nasal sinuses: Secondary | ICD-10-CM | POA: Insufficient documentation

## 2012-05-26 DIAGNOSIS — I10 Essential (primary) hypertension: Secondary | ICD-10-CM | POA: Insufficient documentation

## 2012-05-26 DIAGNOSIS — Q339 Congenital malformation of lung, unspecified: Secondary | ICD-10-CM | POA: Diagnosis not present

## 2012-05-26 DIAGNOSIS — R9389 Abnormal findings on diagnostic imaging of other specified body structures: Secondary | ICD-10-CM | POA: Insufficient documentation

## 2012-05-26 HISTORY — PX: VIDEO BRONCHOSCOPY: SHX5072

## 2012-05-26 SURGERY — BRONCHOSCOPY, WITH FLUOROSCOPY
Anesthesia: Moderate Sedation | Laterality: Bilateral

## 2012-05-26 MED ORDER — PHENYLEPHRINE HCL 0.25 % NA SOLN
NASAL | Status: DC | PRN
  Administered 2012-05-26: 2 via NASAL

## 2012-05-26 MED ORDER — LIDOCAINE HCL 1 % IJ SOLN
INTRAMUSCULAR | Status: DC | PRN
  Administered 2012-05-26: 6 mL

## 2012-05-26 MED ORDER — MIDAZOLAM HCL 10 MG/2ML IJ SOLN
INTRAMUSCULAR | Status: AC
  Filled 2012-05-26: qty 4

## 2012-05-26 MED ORDER — MIDAZOLAM HCL 10 MG/2ML IJ SOLN
INTRAMUSCULAR | Status: DC | PRN
  Administered 2012-05-26: 1 mg via INTRAVENOUS
  Administered 2012-05-26 (×3): 2 mg via INTRAVENOUS

## 2012-05-26 MED ORDER — LIDOCAINE HCL 2 % EX GEL
CUTANEOUS | Status: DC | PRN
  Administered 2012-05-26: 1

## 2012-05-26 MED ORDER — FENTANYL CITRATE 0.05 MG/ML IJ SOLN
INTRAMUSCULAR | Status: AC
  Filled 2012-05-26: qty 4

## 2012-05-26 MED ORDER — FENTANYL CITRATE 0.05 MG/ML IJ SOLN
INTRAMUSCULAR | Status: DC | PRN
  Administered 2012-05-26: 25 ug via INTRAVENOUS
  Administered 2012-05-26: 50 ug via INTRAVENOUS
  Administered 2012-05-26: 25 ug via INTRAVENOUS

## 2012-05-26 NOTE — Progress Notes (Signed)
Bronch w/ video intervention performed.  Bronchial washing intervention performed, bronchial brushing intervention performed, bronchial biopsy intervention performed.

## 2012-05-26 NOTE — Op Note (Signed)
Video Bronchoscopy Procedure Note  Date of Operation: 05/26/2012  Pre-op Diagnosis: Bilateral ground glass infiltrates  Post-op Diagnosis: Same  Surgeon: Levy Pupa  Assistants: none  Anesthesia: conscious sedation, moderate sedation  Meds Given: fentanyl , versed 7mg  in divided doses, 1% lidocaine 21cc total  Operation: Flexible video fiberoptic bronchoscopy and biopsies.  Estimated Blood Loss: 10cc  Complications: none noted  Indications and History: Tiffany Kane is 72 y.o. with CT scan that shows bilateral peri-bronchial and peripheral ground glass infiltrates.  Recommendation was to perform video fiberoptic bronchoscopy with BAL and  biopsies. The risks, benefits, complications, treatment options and expected outcomes were discussed with the patient.  The possibilities of pneumothorax, pneumonia, reaction to medication, pulmonary aspiration, perforation of a viscus, bleeding, failure to diagnose a condition and creating a complication requiring transfusion or operation were discussed with the patient who freely signed the consent.    Description of Procedure: The patient was seen in the Preoperative Area, was examined and was deemed appropriate to proceed.  The patient was taken to Cardiopulmonary at [facility], identified as Tiffany Kane and the procedure verified as Flexible Video Fiberoptic Bronchoscopy.  A Time Out was held and the above information confirmed.   Conscious sedation was initiated as indicated above. The video fiberoptic bronchoscope was introduced via the R nare and a general inspection was performed which showed normal cords, normal trachea, normal main carina. The R sided airways were inspected and showed normal RUL, BI, RML and RLL with only some minor ectasia noted. The L side was then inspected. The LLL, Lingular and LUL airways were normal.  BAL was performed in the LUL with 60cc NS instilled and 26cc returned to be sent for cytology, microbiology  and FLOW cytometry. Under fluoroscopic guidance transbronchial brushings were performed in the LUL for fungal and AFB smears, transbronchial forceps biopsies were performed in the LUL for pathology. There was some initial mild bleeding that stopped quickly. The patient tolerated the procedure well. The bronchoscope was removed. There were no obvious complications. A post-procedural CXR is pending.   Samples: 1. Transbronchial brushings from LUL 2. Transbronchial forceps biopsies from LUL 3. BAL from LUL  Plans:  We will review the cytology, pathology and microbiology results with the patient when they become available.  Outpatient followup will be with Dr Delton Coombes.    Levy Pupa, MD, PhD 05/26/2012, 1:50 PM [facility] [phone] or if no answer [phone]

## 2012-05-26 NOTE — Interval H&P Note (Signed)
PCCM Interval Note  Pt presents today for FOB, BAL + possible bx's to evaluate her scattered GGI on CT scan of the chest   Filed Vitals:   05/26/12 1245  BP: 134/76  Pulse: 69  Resp: 12   CT chest 05/20/12: Comparison: Chest x-ray of 03/21/2012 and CT chest of 01/29/2012  Findings: On the lung window images, many of the previously  described ground-glass opacities are again noted diffusely  throughout the lungs and are relatively stable. A few areas of  increased somewhat particularly within the left upper lobe and  right upper lobe. In addition, there is new ground-glass opacity  within the left upper lobe near the apex laterally. The majority of  these ground-glass opacities are peripheral in location. In view of  the peripheral location and the waxing and waning of some of these  opacities, a subacute / chronic process such as organizing  pneumonia would be a consideration. Other entities would be  nonspecific interstitial pneumonitis, usual interstitial  pneumonitis and eosinophilic pneumonia, all of which can be  subacute and chronic. Low grade adenocarcinoma remains in the  differential but entities such as organizing pneumonia would be  favored. Clinically correlate motion is recommended. No effusion  is seen. The central airway is patent.  On soft tissue window images, asymmetric enlargement of the right  lobe of thyroid is noted with apparent prior left thyroidectomy.  On this unenhanced study, no mediastinal or hilar adenopathy is  seen. Coronary artery calcifications are noted particularly in the  distribution of the left anterior descending artery. Prominence of  the ascending aorta is again noted measuring 41 mm in diameter.  Some contrast is noted within the renal collecting systems of  questionable significance.  IMPRESSION:  1. The majority of the previously described ground-glass opacities  are peripheral in location and stable although there are new areas  of  ground-glass opacity also present bilaterally. The primary  considerations are that of organizing pneumonia such as BOOP,  nonspecific interstitial pneumonitis, usual interstitial  pneumonitis, or possibly chronic eosinophilic pneumonia. Low grade  adenocarcinoma remains within the differential.  2. Coronary artery calcifications.  Plan:  Proceed with FOB today.  All questions answered.

## 2012-05-26 NOTE — H&P (View-Only) (Signed)
Subjective:    Patient ID: Tiffany Kane, female    DOB: 08-04-1940, 72 y.o.   MRN: [id number]  HPI 72 yo remote smoker (3 pk-yrs) with hx HTN, ? Breast CA (with negative post-op path after positive needle bx 2002), allergies and sinus disease. She is referred for eval by Dr Gerri Spore for cough and abnormal CXR/CT scan. She was hospitalized early Nov '13 for apparent CAP - experienced fatigue, cough productive of , loss of appetite, pain with cough. She is improved, still with a hacky cough, happens w exertion or with a lot of talking. She has a hx of CT scan chest from 09/2011 and 9/13 that show scattered multilobar small GG opacities, stable on serial exams. Prompted by abnormal CXR in 09/2011 (for cough and ? PNA). She is clinically better than hospital but not back to her baseline of mid-October. She has nasal gtt and sinus fullness. Rarely has GERD sx.   ROV 05/20/12 -- returns to discuss cough and her abnormal CT scan chest. We decided to repeat the scan to look for interval change >> largely stable peripheral GG areas. She feels better - her cough is gone. We started allergy regimen last time; she stopped taking fluticasone spray due to jitteriness.      Objective:   Physical Exam Filed Vitals:   05/20/12 1347  BP: 112/70  Pulse: 53  Temp: 97.4 F (36.3 C)   Gen: Pleasant, well-nourished, in no distress,  normal affect  ENT: No lesions,  mouth clear,  oropharynx clear, no postnasal drip  Neck: No JVD, no TMG, no carotid bruits  Lungs: No use of accessory muscles, clear without rales or rhonchi  Cardiovascular: RRR, heart sounds normal, no murmur or gallops, no peripheral edema  Musculoskeletal: No deformities, no cyanosis or clubbing  Neuro: alert, non focal  Skin: Warm, no lesions or rashes   Ct scan chest 04/29/12 --  Comparison: Chest x-ray of 03/21/2012 and CT chest of 01/29/2012  Findings: On the lung window images, many of the previously  described ground-glass  opacities are again noted diffusely  throughout the lungs and are relatively stable. A few areas of  increased somewhat particularly within the left upper lobe and  right upper lobe. In addition, there is new ground-glass opacity  within the left upper lobe near the apex laterally. The majority of  these ground-glass opacities are peripheral in location. In view of  the peripheral location and the waxing and waning of some of these  opacities, a subacute / chronic process such as organizing  pneumonia would be a consideration. Other entities would be  nonspecific interstitial pneumonitis, usual interstitial  pneumonitis and eosinophilic pneumonia, all of which can be  subacute and chronic. Low grade adenocarcinoma remains in the  differential but entities such as organizing pneumonia would be  favored. Clinically correlate motion is recommended. No effusion  is seen. The central airway is patent.  On soft tissue window images, asymmetric enlargement of the right  lobe of thyroid is noted with apparent prior left thyroidectomy.  On this unenhanced study, no mediastinal or hilar adenopathy is  seen. Coronary artery calcifications are noted particularly in the  distribution of the left anterior descending artery. Prominence of  the ascending aorta is again noted measuring 41 mm in diameter.  Some contrast is noted within the renal collecting systems of  questionable significance.  IMPRESSION:  1. The majority of the previously described ground-glass opacities  are peripheral in location and stable although  there are new areas  of ground-glass opacity also present bilaterally. The primary  considerations are that of organizing pneumonia such as BOOP,  nonspecific interstitial pneumonitis, usual interstitial  pneumonitis, or possibly chronic eosinophilic pneumonia. Low grade  adenocarcinoma remains within the differential.  2. Coronary artery calcifications.      Assessment & Plan:    Bilateral pulmonary infiltrates on CXR GGI still present on CT chest 12/13, some new areas of involvement. Etiology unclear. Discussed options for f/u - in the end I believe it makes the most sense to pursue FOB with BAL and Bx's. She agrees.  - set up FOB 13:00 05/26/12 at [facility] - rov in 1 month  Chronic cough Better - stop fluticasone spray, continue the loratadine

## 2012-05-27 ENCOUNTER — Encounter (HOSPITAL_COMMUNITY): Payer: Self-pay | Admitting: Emergency Medicine

## 2012-05-27 LAB — AFB STAIN

## 2012-05-27 LAB — FUNGAL STAIN

## 2012-05-27 NOTE — Progress Notes (Signed)
Levy Pupa, MD, PhD 05/27/2012, 1:54 PM [facility] [phone] or if no answer [phone]

## 2012-05-29 LAB — CULTURE, BAL-QUANTITATIVE W GRAM STAIN

## 2012-05-29 NOTE — Progress Notes (Signed)
Quick Note:  Spoke with patient, informed her of results as listed below per RB Patient verbalized understanding and nothing further needed at this time ______ 

## 2012-06-03 DIAGNOSIS — F411 Generalized anxiety disorder: Secondary | ICD-10-CM | POA: Diagnosis not present

## 2012-06-03 DIAGNOSIS — Z79899 Other long term (current) drug therapy: Secondary | ICD-10-CM | POA: Diagnosis not present

## 2012-06-03 DIAGNOSIS — E7800 Pure hypercholesterolemia, unspecified: Secondary | ICD-10-CM | POA: Diagnosis not present

## 2012-06-03 DIAGNOSIS — I10 Essential (primary) hypertension: Secondary | ICD-10-CM | POA: Diagnosis not present

## 2012-06-13 ENCOUNTER — Encounter: Payer: Self-pay | Admitting: Emergency Medicine

## 2012-06-13 ENCOUNTER — Ambulatory Visit (INDEPENDENT_AMBULATORY_CARE_PROVIDER_SITE_OTHER): Payer: Medicare Other | Admitting: Emergency Medicine

## 2012-06-13 ENCOUNTER — Ambulatory Visit (INDEPENDENT_AMBULATORY_CARE_PROVIDER_SITE_OTHER)
Admission: RE | Admit: 2012-06-13 | Discharge: 2012-06-13 | Disposition: A | Discharge status: Home / Self Care | Payer: Medicare Other | Source: Ambulatory Visit | Attending: Emergency Medicine | Admitting: Emergency Medicine

## 2012-06-13 VITALS — BP 120/78 | HR 73 | Temp 97.1°F | Ht 61.0 in | Wt 151.0 lb

## 2012-06-13 DIAGNOSIS — R918 Other nonspecific abnormal finding of lung field: Secondary | ICD-10-CM

## 2012-06-13 MED ORDER — PREDNISONE 10 MG PO TABS: 30.0000 mg | ORAL_TABLET | Freq: Every day | ORAL | Status: DC

## 2012-06-13 NOTE — Progress Notes (Signed)
Quick Note:  Spoke with patient, informed her of results/recs as listed below per RB. Pt verbalized understanding and nothing further needed at this time. ______ 

## 2012-06-13 NOTE — Progress Notes (Signed)
Subjective:    Patient ID: ZI SEK, female    DOB: [birth year], 72 y.o.   MRN: [id number]  HPI 72 yo remote smoker (3 pk-yrs) with hx HTN, ? Breast CA (with negative post-op path after positive needle bx 2002), allergies and sinus disease. She is referred for eval by Dr Gerri Spore for cough and abnormal CXR/CT scan. She was hospitalized early Nov '13 for apparent CAP - experienced fatigue, cough productive of , loss of appetite, pain with cough. She is improved, still with a hacky cough, happens w exertion or with a lot of talking. She has a hx of CT scan chest from 09/2011 and 9/13 that show scattered multilobar small GG opacities, stable on serial exams. Prompted by abnormal CXR in 09/2011 (for cough and ? PNA). She is clinically better than hospital but not back to her baseline of mid-October. She has nasal gtt and sinus fullness. Rarely has GERD sx.   ROV 05/20/12 -- returns to discuss cough and her abnormal CT scan chest. We decided to repeat the scan to look for interval change >> largely stable peripheral GG areas. She feels better - her cough is gone. We started allergy regimen last time; she stopped taking fluticasone spray due to jitteriness.   ROV 06/13/12 -- follows up for cough and GGI on her CT scan chest, last was 05/20/12. She is s/p FOB on 1/13 >> TBBx all normal, AFB negative so far, bacterial > normal flora. She reports feeling well, no cough. No longer on an allergy regimen - may need to restart for the Spring.      Objective:   Physical Exam Filed Vitals:   06/13/12 0907  BP: 120/78  Pulse: 73  Temp: 97.1 F (36.2 C)   Gen: Pleasant, well-nourished, in no distress,  normal affect  ENT: No lesions,  mouth clear,  oropharynx clear, no postnasal drip  Neck: No JVD, no TMG, no carotid bruits  Lungs: No use of accessory muscles, clear without rales or rhonchi  Cardiovascular: RRR, heart sounds normal, no murmur or gallops, no peripheral edema  Musculoskeletal: No  deformities, no cyanosis or clubbing  Neuro: alert, non focal  Skin: Warm, no lesions or rashes   Ct scan chest 04/29/12 --  Comparison: Chest x-ray of 03/21/2012 and CT chest of 01/29/2012  Findings: On the lung window images, many of the previously  described ground-glass opacities are again noted diffusely  throughout the lungs and are relatively stable. A few areas of  increased somewhat particularly within the left upper lobe and  right upper lobe. In addition, there is new ground-glass opacity  within the left upper lobe near the apex laterally. The majority of  these ground-glass opacities are peripheral in location. In view of  the peripheral location and the waxing and waning of some of these  opacities, a subacute / chronic process such as organizing  pneumonia would be a consideration. Other entities would be  nonspecific interstitial pneumonitis, usual interstitial  pneumonitis and eosinophilic pneumonia, all of which can be  subacute and chronic. Low grade adenocarcinoma remains in the  differential but entities such as organizing pneumonia would be  favored. Clinically correlate motion is recommended. No effusion  is seen. The central airway is patent.  On soft tissue window images, asymmetric enlargement of the right  lobe of thyroid is noted with apparent prior left thyroidectomy.  On this unenhanced study, no mediastinal or hilar adenopathy is  seen. Coronary artery calcifications are noted particularly in  the  distribution of the left anterior descending artery. Prominence of  the ascending aorta is again noted measuring 41 mm in diameter.  Some contrast is noted within the renal collecting systems of  questionable significance.  IMPRESSION:  1. The majority of the previously described ground-glass opacities  are peripheral in location and stable although there are new areas  of ground-glass opacity also present bilaterally. The primary  considerations are that  of organizing pneumonia such as BOOP,  nonspecific interstitial pneumonitis, usual interstitial  pneumonitis, or possibly chronic eosinophilic pneumonia. Low grade  adenocarcinoma remains within the differential.  2. Coronary artery calcifications.      Assessment & Plan:  Bilateral pulmonary infiltrates on CXR BAL cx negative to date. 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 She fels well. Will check CXR today, plan to rx with pred 30 x 3 weeks then stop. Repeat CT scan in April. If CXr today show complete clearing then we will defer the pred.

## 2012-06-13 NOTE — Assessment & Plan Note (Addendum)
BAL cx negative to date. 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 She fels well. Will check CXR today, plan to rx with pred 30 x 3 weeks then stop. Repeat CT scan in April. If CXr today show complete clearing then we will defer the pred.

## 2012-06-13 NOTE — Patient Instructions (Addendum)
CXR today We will plan to start prednisone 30mg  daily x 3 weeks. If your CXR today is completely cleared then we will defer the prednisone We will repeat your CT scan of the chest in April '14 Follow with Dr Delton Coombes in 1 month

## 2012-06-20 ENCOUNTER — Ambulatory Visit: Payer: Medicare Other | Admitting: Emergency Medicine

## 2012-06-23 LAB — FUNGUS CULTURE W SMEAR
Fungal Smear: NONE SEEN
Special Requests: NORMAL

## 2012-07-08 LAB — AFB CULTURE WITH SMEAR (NOT AT ARMC)
Acid Fast Smear: NONE SEEN
Special Requests: NORMAL

## 2012-07-16 ENCOUNTER — Ambulatory Visit (INDEPENDENT_AMBULATORY_CARE_PROVIDER_SITE_OTHER): Payer: Medicare Other | Admitting: Emergency Medicine

## 2012-07-16 ENCOUNTER — Encounter: Payer: Self-pay | Admitting: Emergency Medicine

## 2012-07-16 VITALS — BP 138/82 | HR 65 | Temp 96.1°F | Ht 61.0 in | Wt 151.2 lb

## 2012-07-16 DIAGNOSIS — R918 Other nonspecific abnormal finding of lung field: Secondary | ICD-10-CM

## 2012-07-16 NOTE — Progress Notes (Signed)
Subjective:    Patient ID: Tiffany Kane, female    DOB: [birth year], 72 y.o.   MRN: [id number]  HPI 72 yo remote smoker (3 pk-yrs) with hx HTN, ? Breast CA (with negative post-op path after positive needle bx 2002), allergies and sinus disease. She is referred for eval by Dr Gerri Spore for cough and abnormal CXR/CT scan. She was hospitalized early Nov '13 for apparent CAP - experienced fatigue, cough productive of , loss of appetite, pain with cough. She is improved, still with a hacky cough, happens w exertion or with a lot of talking. She has a hx of CT scan chest from 09/2011 and 9/13 that show scattered multilobar small GG opacities, stable on serial exams. Prompted by abnormal CXR in 09/2011 (for cough and ? PNA). She is clinically better than hospital but not back to her baseline of mid-October. She has nasal gtt and sinus fullness. Rarely has GERD sx.   ROV 05/20/12 -- returns to discuss cough and her abnormal CT scan chest. We decided to repeat the scan to look for interval change >> largely stable peripheral GG areas. She feels better - her cough is gone. We started allergy regimen last time; she stopped taking fluticasone spray due to jitteriness.   ROV 06/13/12 -- follows up for cough and GGI on her CT scan chest, last was 05/20/12. She is s/p FOB on 1/13 >> TBBx all normal, AFB negative so far, bacterial > normal flora. She reports feeling well, no cough. No longer on an allergy regimen - may need to restart for the Spring.   ROV 07/16/12 -- follows for cough, abnormal CT scan chest, ? COP with negative bx's and cx's on FOB 05/26/12. Treated with pred 30mg  x 3 weeks. She had side effects >> had jitters, some anxiety, increased appetite. She has CT scan planned for April.       Objective:   Physical Exam Filed Vitals:   07/16/12 1445  BP: 138/82  Pulse: 65  Temp: 96.1 F (35.6 C)   Gen: Pleasant, well-nourished, in no distress,  normal affect  ENT: No lesions,  mouth clear,   oropharynx clear, no postnasal drip  Neck: No JVD, no TMG, no carotid bruits  Lungs: No use of accessory muscles, clear without rales or rhonchi  Cardiovascular: RRR, heart sounds normal, no murmur or gallops, no peripheral edema  Musculoskeletal: No deformities, no cyanosis or clubbing  Neuro: alert, non focal  Skin: Warm, no lesions or rashes   Ct scan chest 04/29/12 --  Comparison: Chest x-ray of 03/21/2012 and CT chest of 01/29/2012  Findings: On the lung window images, many of the previously  described ground-glass opacities are again noted diffusely  throughout the lungs and are relatively stable. A few areas of  increased somewhat particularly within the left upper lobe and  right upper lobe. In addition, there is new ground-glass opacity  within the left upper lobe near the apex laterally. The majority of  these ground-glass opacities are peripheral in location. In view of  the peripheral location and the waxing and waning of some of these  opacities, a subacute / chronic process such as organizing  pneumonia would be a consideration. Other entities would be  nonspecific interstitial pneumonitis, usual interstitial  pneumonitis and eosinophilic pneumonia, all of which can be  subacute and chronic. Low grade adenocarcinoma remains in the  differential but entities such as organizing pneumonia would be  favored. Clinically correlate motion is recommended. No effusion  is  seen. The central airway is patent.  On soft tissue window images, asymmetric enlargement of the right  lobe of thyroid is noted with apparent prior left thyroidectomy.  On this unenhanced study, no mediastinal or hilar adenopathy is  seen. Coronary artery calcifications are noted particularly in the  distribution of the left anterior descending artery. Prominence of  the ascending aorta is again noted measuring 41 mm in diameter.  Some contrast is noted within the renal collecting systems of   questionable significance.  IMPRESSION:  1. The majority of the previously described ground-glass opacities  are peripheral in location and stable although there are new areas  of ground-glass opacity also present bilaterally. The primary  considerations are that of organizing pneumonia such as BOOP,  nonspecific interstitial pneumonitis, usual interstitial  pneumonitis, or possibly chronic eosinophilic pneumonia. Low grade  adenocarcinoma remains within the differential.  2. Coronary artery calcifications.      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 She is clinically better, has finished the prednisone.  - plan repeat Ct scan in April then follow to review.

## 2012-07-16 NOTE — Assessment & Plan Note (Signed)
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 She is clinically better, has finished the prednisone.  - plan repeat Ct scan in April then follow to review.

## 2012-07-16 NOTE — Patient Instructions (Addendum)
Please get your CT scan as planned in April Follow with Dr Delton Coombes in April after the scan to review.

## 2012-08-25 ENCOUNTER — Ambulatory Visit (INDEPENDENT_AMBULATORY_CARE_PROVIDER_SITE_OTHER)
Admission: RE | Admit: 2012-08-25 | Discharge: 2012-08-25 | Disposition: A | Discharge status: Home / Self Care | Payer: Medicare Other | Source: Ambulatory Visit | Attending: Emergency Medicine | Admitting: Emergency Medicine

## 2012-08-25 DIAGNOSIS — R918 Other nonspecific abnormal finding of lung field: Secondary | ICD-10-CM

## 2012-09-10 ENCOUNTER — Ambulatory Visit: Payer: Medicare Other | Admitting: Emergency Medicine

## 2012-09-15 ENCOUNTER — Ambulatory Visit (INDEPENDENT_AMBULATORY_CARE_PROVIDER_SITE_OTHER): Payer: Medicare Other | Admitting: Emergency Medicine

## 2012-09-15 ENCOUNTER — Encounter: Payer: Self-pay | Admitting: Emergency Medicine

## 2012-09-15 VITALS — BP 110/70 | HR 76 | Temp 98.3°F | Ht 61.5 in | Wt 159.6 lb

## 2012-09-15 DIAGNOSIS — R05 Cough: Secondary | ICD-10-CM

## 2012-09-15 DIAGNOSIS — R059 Cough, unspecified: Secondary | ICD-10-CM

## 2012-09-15 DIAGNOSIS — R918 Other nonspecific abnormal finding of lung field: Secondary | ICD-10-CM

## 2012-09-15 NOTE — Patient Instructions (Addendum)
Please follow with Dr Delton Coombes in 6 months with a CXR You may want to consider taking your claritin every day during the pollen season.

## 2012-09-15 NOTE — Progress Notes (Signed)
Subjective:    Patient ID: LAVERA VANDERMEER, female    DOB: 1940/12/21, 72 y.o.   MRN: [id number] HPI 72 yo remote smoker (3 pk-yrs) with hx HTN, ? Breast CA (with negative post-op path after positive needle bx 2002), allergies and sinus disease. She is referred for eval by Dr Gerri Spore for cough and abnormal CXR/CT scan. She was hospitalized early Nov '13 for apparent CAP - experienced fatigue, cough productive of , loss of appetite, pain with cough. She is improved, still with a hacky cough, happens w exertion or with a lot of talking. She has a hx of CT scan chest from 09/2011 and 9/13 that show scattered multilobar small GG opacities, stable on serial exams. Prompted by abnormal CXR in 09/2011 (for cough and ? PNA). She is clinically better than hospital but not back to her baseline of mid-October. She has nasal gtt and sinus fullness. Rarely has GERD sx.    ROV 05/20/12 -- returns to discuss cough and her abnormal CT scan chest. We decided to repeat the scan to look for interval change >> largely stable peripheral GG areas. She feels better - her cough is gone. We started allergy regimen last time; she stopped taking fluticasone spray due to jitteriness.   ROV 06/13/12 -- follows up for cough and GGI on her CT scan chest, last was 05/20/12. She is s/p FOB on 1/13 >> TBBx all normal, AFB negative so far, bacterial > normal flora. She reports feeling well, no cough. No longer on an allergy regimen - may need to restart for the Spring.   ROV 07/16/12 -- follows for cough, abnormal CT scan chest, ? COP with negative bx's and cx's on FOB 05/26/12. Treated with pred 30mg  x 3 weeks. She had side effects >> had jitters, some anxiety, increased appetite. She has CT scan planned for April.   ROV 09/15/12 -- follows for cough, abnormal CT scan chest, ? COP with negative bx's and cx's. She is doing well, having no cough or other sx. A repeat Ct scan chest was done > shows large scale interval improvement in scatter GGI  infiltrates, but some residual disease persists.      Objective:   Physical Exam Filed Vitals:   09/15/12 1402  BP: 110/70  Pulse: 76  Temp: 98.3 F (36.8 C)   Gen: Pleasant, well-nourished, in no distress,  normal affect  ENT: No lesions,  mouth clear,  oropharynx clear, no postnasal drip  Neck: No JVD, no TMG, no carotid bruits  Lungs: No use of accessory muscles, clear without rales or rhonchi  Cardiovascular: RRR, heart sounds normal, no murmur or gallops, no peripheral edema  Musculoskeletal: No deformities, no cyanosis or clubbing  Neuro: alert, non focal  Skin: Warm, no lesions or rashes   08/25/12 --  Comparison: 04/29/2012  Findings: The thyroid remains nodular and inhomogeneous. Metallic  clip artifact is noted at the left lung apex. The ascending aortic  ectasia measured at the level of the main pulmonary artery is  stable, 4.1 cm image 27. Coronary arterial calcifications and  probable stent noted. Trace pericardial fluid. No  lymphadenopathy.  Incomplete imaging of the upper abdomen demonstrates normal-  appearing adrenal glands and moderate atherosclerotic aortic  calcification.  Lung fields demonstrate the interval improvement in predominately  subpleural geographic wedge-shaped ground-glass opacity. 3 mm  pulmonary nodule abutting the right major fissure image 35 is  stable. Central airways are patent. No acute osseous finding.  Degenerative change is noted in  the spine.  High-resolution CT images confirm the above findings. There is  minimal air trapping.  Stable T11 wedge compression deformity.  IMPRESSION:  Interval improvement in subpleural wedge-shaped/geographic ground-  glass airspace opacities, for which primary differential  considerations again include hypersensitivity pneumonitis, DIP,  organizing pneumonia, eosinophilic pneumonia, or less likely  adenocarcinoma. No new nodule, mass, or consolidation.       Assessment & Plan:   Chronic cough Controlled at this time  Bilateral pulmonary infiltrates on CXR Improved significantly on Ct from 08/25/12 but not gone. Etiology unclear, but suspect COP. Will practice surveillance, follow CXR and clinical status.

## 2012-09-15 NOTE — Assessment & Plan Note (Signed)
Improved significantly on Ct from 08/25/12 but not gone. Etiology unclear, but suspect COP. Will practice surveillance, follow CXR and clinical status.

## 2012-09-15 NOTE — Assessment & Plan Note (Signed)
Controlled at this time

## 2012-10-16 DIAGNOSIS — Z1231 Encounter for screening mammogram for malignant neoplasm of breast: Secondary | ICD-10-CM | POA: Diagnosis not present

## 2012-10-16 DIAGNOSIS — Z803 Family history of malignant neoplasm of breast: Secondary | ICD-10-CM | POA: Diagnosis not present

## 2013-01-23 DIAGNOSIS — F411 Generalized anxiety disorder: Secondary | ICD-10-CM | POA: Diagnosis not present

## 2013-01-23 DIAGNOSIS — Z1211 Encounter for screening for malignant neoplasm of colon: Secondary | ICD-10-CM | POA: Diagnosis not present

## 2013-01-23 DIAGNOSIS — M810 Age-related osteoporosis without current pathological fracture: Secondary | ICD-10-CM | POA: Diagnosis not present

## 2013-01-23 DIAGNOSIS — I10 Essential (primary) hypertension: Secondary | ICD-10-CM | POA: Diagnosis not present

## 2013-01-23 DIAGNOSIS — E7800 Pure hypercholesterolemia, unspecified: Secondary | ICD-10-CM | POA: Diagnosis not present

## 2013-01-23 DIAGNOSIS — F329 Major depressive disorder, single episode, unspecified: Secondary | ICD-10-CM | POA: Diagnosis not present

## 2013-02-03 DIAGNOSIS — M810 Age-related osteoporosis without current pathological fracture: Secondary | ICD-10-CM | POA: Diagnosis not present

## 2013-03-02 DIAGNOSIS — H43819 Vitreous degeneration, unspecified eye: Secondary | ICD-10-CM | POA: Diagnosis not present

## 2013-03-02 DIAGNOSIS — H259 Unspecified age-related cataract: Secondary | ICD-10-CM | POA: Diagnosis not present

## 2013-03-02 DIAGNOSIS — H31009 Unspecified chorioretinal scars, unspecified eye: Secondary | ICD-10-CM | POA: Diagnosis not present

## 2013-03-02 DIAGNOSIS — H04229 Epiphora due to insufficient drainage, unspecified lacrimal gland: Secondary | ICD-10-CM | POA: Diagnosis not present

## 2013-03-09 DIAGNOSIS — Z23 Encounter for immunization: Secondary | ICD-10-CM | POA: Diagnosis not present

## 2013-03-10 DIAGNOSIS — L02219 Cutaneous abscess of trunk, unspecified: Secondary | ICD-10-CM | POA: Diagnosis not present

## 2013-03-10 DIAGNOSIS — L0292 Furuncle, unspecified: Secondary | ICD-10-CM | POA: Diagnosis not present

## 2013-03-12 DIAGNOSIS — H04229 Epiphora due to insufficient drainage, unspecified lacrimal gland: Secondary | ICD-10-CM | POA: Diagnosis not present

## 2013-03-12 DIAGNOSIS — H04539 Neonatal obstruction of unspecified nasolacrimal duct: Secondary | ICD-10-CM | POA: Diagnosis not present

## 2013-03-15 ENCOUNTER — Other Ambulatory Visit: Payer: Self-pay | Admitting: *Deleted

## 2013-03-15 DIAGNOSIS — I712 Thoracic aortic aneurysm, without rupture: Secondary | ICD-10-CM

## 2013-03-27 ENCOUNTER — Other Ambulatory Visit: Payer: Self-pay | Admitting: Gastroenterology

## 2013-03-27 DIAGNOSIS — K620 Anal polyp: Secondary | ICD-10-CM | POA: Diagnosis not present

## 2013-03-27 DIAGNOSIS — K5730 Diverticulosis of large intestine without perforation or abscess without bleeding: Secondary | ICD-10-CM | POA: Diagnosis not present

## 2013-03-27 DIAGNOSIS — Z1211 Encounter for screening for malignant neoplasm of colon: Secondary | ICD-10-CM | POA: Diagnosis not present

## 2013-04-17 ENCOUNTER — Other Ambulatory Visit (INDEPENDENT_AMBULATORY_CARE_PROVIDER_SITE_OTHER): Payer: Medicare Other

## 2013-04-17 ENCOUNTER — Other Ambulatory Visit: Payer: Medicare Other

## 2013-04-17 DIAGNOSIS — I712 Thoracic aortic aneurysm, without rupture: Secondary | ICD-10-CM | POA: Diagnosis not present

## 2013-04-17 LAB — CREATININE, SERUM: Creatinine, Ser: 0.7 mg/dL (ref 0.4–1.2)

## 2013-04-17 LAB — BUN: BUN: 13 mg/dL (ref 6–23)

## 2013-04-24 ENCOUNTER — Encounter: Payer: Self-pay | Admitting: Emergency Medicine

## 2013-04-24 ENCOUNTER — Ambulatory Visit (INDEPENDENT_AMBULATORY_CARE_PROVIDER_SITE_OTHER)
Admission: RE | Admit: 2013-04-24 | Discharge: 2013-04-24 | Disposition: A | Discharge status: Home / Self Care | Payer: Medicare Other | Source: Ambulatory Visit | Attending: Emergency Medicine | Admitting: Emergency Medicine

## 2013-04-24 ENCOUNTER — Ambulatory Visit (INDEPENDENT_AMBULATORY_CARE_PROVIDER_SITE_OTHER): Payer: Medicare Other | Admitting: Emergency Medicine

## 2013-04-24 VITALS — BP 130/78 | HR 89 | Temp 98.0°F | Ht 61.5 in | Wt 158.8 lb

## 2013-04-24 DIAGNOSIS — R05 Cough: Secondary | ICD-10-CM | POA: Diagnosis not present

## 2013-04-24 DIAGNOSIS — J984 Other disorders of lung: Secondary | ICD-10-CM | POA: Diagnosis not present

## 2013-04-24 DIAGNOSIS — R918 Other nonspecific abnormal finding of lung field: Secondary | ICD-10-CM | POA: Diagnosis not present

## 2013-04-24 DIAGNOSIS — R053 Chronic cough: Secondary | ICD-10-CM

## 2013-04-24 NOTE — Progress Notes (Signed)
Subjective:    Patient ID: Tiffany Kane, female    DOB: Jun 07, 1940, 72 y.o.   MRN: [id number] HPI 72 yo remote smoker (3 pk-yrs) with hx HTN, ? Breast CA (with negative post-op path after positive needle bx 2002), allergies and sinus disease. She is referred for eval by Dr Gerri Spore for cough and abnormal CXR/CT scan. She was hospitalized early Nov '13 for apparent CAP - experienced fatigue, cough productive of , loss of appetite, pain with cough. She is improved, still with a hacky cough, happens w exertion or with a lot of talking. She has a hx of CT scan chest from 09/2011 and 9/13 that show scattered multilobar small GG opacities, stable on serial exams. Prompted by abnormal CXR in 09/2011 (for cough and ? PNA). She is clinically better than hospital but not back to her baseline of mid-October. She has nasal gtt and sinus fullness. Rarely has GERD sx.    ROV 05/20/12 -- returns to discuss cough and her abnormal CT scan chest. We decided to repeat the scan to look for interval change >> largely stable peripheral GG areas. She feels better - her cough is gone. We started allergy regimen last time; she stopped taking fluticasone spray due to jitteriness.   ROV 06/13/12 -- follows up for cough and GGI on her CT scan chest, last was 05/20/12. She is s/p FOB on 1/13 >> TBBx all normal, AFB negative so far, bacterial > normal flora. She reports feeling well, no cough. No longer on an allergy regimen - may need to restart for the Spring.   ROV 07/16/12 -- follows for cough, abnormal CT scan chest, ? COP with negative bx's and cx's on FOB 05/26/12. Treated with pred 30mg  x 3 weeks. She had side effects >> had jitters, some anxiety, increased appetite. She has CT scan planned for April.   ROV 09/15/12 -- follows for cough, abnormal CT scan chest, ? COP with negative bx's and cx's. She is doing well, having no cough or other sx. A repeat Ct scan chest was done > shows large scale interval improvement in scatter GGI  infiltrates, but some residual disease persists.   ROV 04/24/13 -- follows for cough, abnormal CT scan chest, ? COP with negative bx's and cx's on FOB 05/26/12. Was treated with pred with largescale clearing on CT scan 08/25/12. Her cough is resolved, breathing stable, no limitations.      Objective:   Physical Exam Filed Vitals:   04/24/13 0912  BP: 130/78  Pulse: 89  Temp: 98 F (36.7 C)  TempSrc: Oral  Height: 5' 1.5" (1.562 m)  Weight: 158 lb 12.8 oz (72.031 kg)  SpO2: 96%   Gen: Pleasant, well-nourished, in no distress,  normal affect  ENT: No lesions,  mouth clear,  oropharynx clear, no postnasal drip  Neck: No JVD, no TMG, no carotid bruits  Lungs: No use of accessory muscles, clear without rales or rhonchi  Cardiovascular: RRR, heart sounds normal, no murmur or gallops, no peripheral edema  Musculoskeletal: No deformities, no cyanosis or clubbing  Neuro: alert, non focal  Skin: Warm, no lesions or rashes   08/25/12 --  Comparison: 04/29/2012  Findings: The thyroid remains nodular and inhomogeneous. Metallic  clip artifact is noted at the left lung apex. The ascending aortic  ectasia measured at the level of the main pulmonary artery is  stable, 4.1 cm image 27. Coronary arterial calcifications and  probable stent noted. Trace pericardial fluid. No  lymphadenopathy.  Incomplete  imaging of the upper abdomen demonstrates normal-  appearing adrenal glands and moderate atherosclerotic aortic  calcification.  Lung fields demonstrate the interval improvement in predominately  subpleural geographic wedge-shaped ground-glass opacity. 3 mm  pulmonary nodule abutting the right major fissure image 35 is  stable. Central airways are patent. No acute osseous finding.  Degenerative change is noted in the spine.  High-resolution CT images confirm the above findings. There is  minimal air trapping.  Stable T11 wedge compression deformity.  IMPRESSION:  Interval improvement  in subpleural wedge-shaped/geographic ground-  glass airspace opacities, for which primary differential  considerations again include hypersensitivity pneumonitis, DIP,  organizing pneumonia, eosinophilic pneumonia, or less likely  adenocarcinoma. No new nodule, mass, or consolidation.       Assessment & Plan:  Bilateral pulmonary infiltrates on CXR Stable clinically and by CXR.  - will defer any repeat CT scanning at this time, will move to get CT if cough returns or dyspnea returns.

## 2013-04-24 NOTE — Assessment & Plan Note (Signed)
Stable clinically and by CXR.  - will defer any repeat CT scanning at this time, will move to get CT if cough returns or dyspnea returns.

## 2013-04-24 NOTE — Patient Instructions (Signed)
Your CXR looks stable We not perform a CT scan of your chest now. If you develop cough or shortness of breath please call so we can perform.  Follow with Dr Delton Coombes in 12 months or sooner if you have any problems

## 2013-04-27 ENCOUNTER — Ambulatory Visit
Admission: RE | Admit: 2013-04-27 | Discharge: 2013-04-27 | Disposition: A | Discharge status: Home / Self Care | Payer: Medicare Other | Source: Ambulatory Visit | Attending: Interventional Cardiology | Admitting: Interventional Cardiology

## 2013-04-27 DIAGNOSIS — I712 Thoracic aortic aneurysm, without rupture: Secondary | ICD-10-CM | POA: Diagnosis not present

## 2013-04-27 MED ORDER — GADOBENATE DIMEGLUMINE 529 MG/ML IV SOLN
14.0000 mL | Freq: Once | INTRAVENOUS | Status: AC | PRN
  Administered 2013-04-27: 14 mL via INTRAVENOUS

## 2013-05-01 ENCOUNTER — Encounter: Payer: Self-pay | Admitting: Interventional Cardiology

## 2013-05-01 ENCOUNTER — Ambulatory Visit (INDEPENDENT_AMBULATORY_CARE_PROVIDER_SITE_OTHER): Payer: Medicare Other | Admitting: Interventional Cardiology

## 2013-05-01 VITALS — BP 143/82 | HR 66 | Ht 61.5 in | Wt 157.8 lb

## 2013-05-01 DIAGNOSIS — I10 Essential (primary) hypertension: Secondary | ICD-10-CM | POA: Diagnosis not present

## 2013-05-01 DIAGNOSIS — I2510 Atherosclerotic heart disease of native coronary artery without angina pectoris: Secondary | ICD-10-CM

## 2013-05-01 DIAGNOSIS — I712 Thoracic aortic aneurysm, without rupture: Secondary | ICD-10-CM | POA: Diagnosis not present

## 2013-05-01 DIAGNOSIS — E782 Mixed hyperlipidemia: Secondary | ICD-10-CM | POA: Diagnosis not present

## 2013-05-01 NOTE — Progress Notes (Signed)
Patient ID: Tiffany Kane, female   DOB: 1940-06-23, 72 y.o.   MRN: [id number]    [address] [place], [place]  [address] Phone: [phone] Fax:  [phone]  Date:  05/01/2013   ID:  Tiffany Kane, DOB [birth year], MRN [id number]  PCP:  Elie Confer, MD      History of Present Illness: Tiffany Kane is a 72 y.o. female who has had a thoracic aneurysm. It was stable in size by MRA in 2011. Recent MRA in 12/14 showed a diameter of 4.3 cm of the thoracic aorta.She is exercising every other day. No more  treadmill use now and walks outside some. SHe has not had any chest pain recently. She denies SOB with walking or at rest. [facility] was caring for her elderly mother who passed away in 09-01-22, at 83.  SHe is having panic attacks rarely.  She checks BP at home.  It runs about 130-138/70-80s.  It will be higher during a panic attack...highest 150 systolic.    Wt Readings from Last 3 Encounters:  05/01/13 157 lb 12.8 oz (71.578 kg)  04/24/13 158 lb 12.8 oz (72.031 kg)  09/15/12 159 lb 9.6 oz (72.394 kg)     Past Medical History  Diagnosis Date  . Hypertension   . Hyperlipemia   . Depression   . Anxiety   . High cholesterol   . Coronary artery disease   . Esophageal reflux   . Arthritis   . Ascites   . Pneumonia   . Thyromegaly   . Compression fracture     T-11  . Thoracic aneurysm   . Bronchitis   . Breast cancer 2002  . Hyperlipidemia     Current Outpatient Prescriptions  Medication Sig Dispense Refill  . ALPRAZolam (XANAX) 0.5 MG tablet Take 0.5 mg by mouth Twice daily as needed. For anxiety      . aspirin 81 MG chewable tablet Chew 81 mg by mouth daily.      Marland Kitchen CALCIUM PO Take 1 tablet by mouth daily.      . Cholecalciferol (VITAMIN D3) 2000 UNITS capsule Take 2,000 Units by mouth daily.      Marland Kitchen escitalopram (LEXAPRO) 10 MG tablet Take 10 mg by mouth daily.      Marland Kitchen ibuprofen (ADVIL,MOTRIN) 200 MG tablet Take 200 mg by mouth every 6 (six)  hours as needed. For pain      . metoprolol succinate (TOPROL-XL) 50 MG 24 hr tablet Take 50 mg by mouth daily. Take with or immediately following a meal.      . simvastatin (ZOCOR) 80 MG tablet Take 40 mg by mouth at bedtime.      . triamterene-hydrochlorothiazide (MAXZIDE-25) 37.5-25 MG per tablet Take 1 tablet by mouth daily.       No current facility-administered medications for this visit.    Allergies:   No Known Allergies  Social History:  The patient  reports that she quit smoking about 20 years ago. Her smoking use included Cigarettes. She has a 3 pack-year smoking history. She has never used smokeless tobacco. She reports that she does not drink alcohol or use illicit drugs.   Family History:  The patient's family history includes Breast cancer in her mother; Cancer - Other in her father; Diabetes in her mother; Heart disease in her brother; Prostate cancer in her brother.   ROS:  Please see the history of present illness.  No nausea, vomiting.  No fevers, chills.  No focal weakness.  No dysuria.    All other systems reviewed and negative.   PHYSICAL EXAM: VS:  BP 143/82  Pulse 66  Ht 5' 1.5" (1.562 m)  Wt 157 lb 12.8 oz (71.578 kg)  BMI 29.34 kg/m2 Well nourished, well developed, in no acute distress HEENT: normal Neck: no JVD, no carotid bruits Cardiac:  normal S1, S2; RRR;  Lungs:  clear to auscultation bilaterally, no wheezing, rhonchi or rales Abd: soft, nontender, no hepatomegaly Ext: no edema Skin: warm and dry Neuro:   no focal abnormalities noted  EKG:  NSR, no st segment changes    ASSESSMENT AND PLAN:  1. Thoracic aneurysm: Stable by MRA.  Slow rate of growth.   2. HTN: Controlled at home.  Continue beta blocker.    3. High cholesterol:  Lipids well controlled at most recent check.  LDL 100.   4. CAD: Nonobstructive by cath in 2001..  Negative stress test in 2010.   5. Recommended walking 5 days a week, 30 minutes per day.  Signed, Fredric Mare, MD,  [facility] 05/01/2013 9:24 AM

## 2013-05-01 NOTE — Patient Instructions (Addendum)
You will need a repeat MRA of the chest in 1 year at [facility] ([address] [place] location).  You will need to come here for lab test on 04/19/14 for BUN and Creatinine.   Your physician wants you to follow-up in: 1 year with Dr. Eldridge Dace. You will receive a reminder letter in the mail two months in advance. If you don't receive a letter, please call our office to schedule the follow-up appointment.  Your physician recommends that you continue on your current medications as directed. Please refer to the Current Medication list given to you today.

## 2013-08-12 DIAGNOSIS — S99919A Unspecified injury of unspecified ankle, initial encounter: Secondary | ICD-10-CM | POA: Diagnosis not present

## 2013-08-12 DIAGNOSIS — S8990XA Unspecified injury of unspecified lower leg, initial encounter: Secondary | ICD-10-CM | POA: Diagnosis not present

## 2013-09-30 DIAGNOSIS — R3989 Other symptoms and signs involving the genitourinary system: Secondary | ICD-10-CM | POA: Diagnosis not present

## 2013-09-30 DIAGNOSIS — N390 Urinary tract infection, site not specified: Secondary | ICD-10-CM | POA: Diagnosis not present

## 2013-11-03 ENCOUNTER — Encounter: Payer: Self-pay | Admitting: *Deleted

## 2013-11-30 DIAGNOSIS — Z1231 Encounter for screening mammogram for malignant neoplasm of breast: Secondary | ICD-10-CM | POA: Diagnosis not present

## 2013-11-30 DIAGNOSIS — Z853 Personal history of malignant neoplasm of breast: Secondary | ICD-10-CM | POA: Diagnosis not present

## 2014-02-05 DIAGNOSIS — Z0000 Encounter for general adult medical examination without abnormal findings: Secondary | ICD-10-CM | POA: Diagnosis not present

## 2014-02-05 DIAGNOSIS — F329 Major depressive disorder, single episode, unspecified: Secondary | ICD-10-CM | POA: Diagnosis not present

## 2014-02-05 DIAGNOSIS — F3289 Other specified depressive episodes: Secondary | ICD-10-CM | POA: Diagnosis not present

## 2014-02-05 DIAGNOSIS — Z23 Encounter for immunization: Secondary | ICD-10-CM | POA: Diagnosis not present

## 2014-02-05 DIAGNOSIS — M949 Disorder of cartilage, unspecified: Secondary | ICD-10-CM | POA: Diagnosis not present

## 2014-02-05 DIAGNOSIS — E782 Mixed hyperlipidemia: Secondary | ICD-10-CM | POA: Diagnosis not present

## 2014-02-05 DIAGNOSIS — I2510 Atherosclerotic heart disease of native coronary artery without angina pectoris: Secondary | ICD-10-CM | POA: Diagnosis not present

## 2014-02-05 DIAGNOSIS — I10 Essential (primary) hypertension: Secondary | ICD-10-CM | POA: Diagnosis not present

## 2014-02-05 DIAGNOSIS — M899 Disorder of bone, unspecified: Secondary | ICD-10-CM | POA: Diagnosis not present

## 2014-04-16 ENCOUNTER — Other Ambulatory Visit (INDEPENDENT_AMBULATORY_CARE_PROVIDER_SITE_OTHER): Payer: Medicare Other | Admitting: *Deleted

## 2014-04-16 DIAGNOSIS — I712 Thoracic aortic aneurysm, without rupture, unspecified: Secondary | ICD-10-CM

## 2014-04-18 LAB — CREATININE, SERUM: Creatinine, Ser: 0.8 mg/dL (ref 0.4–1.2)

## 2014-04-18 LAB — BUN: BUN: 17 mg/dL (ref 6–23)

## 2014-04-20 DIAGNOSIS — J069 Acute upper respiratory infection, unspecified: Secondary | ICD-10-CM | POA: Diagnosis not present

## 2014-04-30 ENCOUNTER — Inpatient Hospital Stay: Admission: RE | Admit: 2014-04-30 | Payer: Medicare Other | Source: Ambulatory Visit

## 2014-05-21 ENCOUNTER — Ambulatory Visit
Admission: RE | Admit: 2014-05-21 | Discharge: 2014-05-21 | Disposition: A | Discharge status: Home / Self Care | Payer: Medicare Other | Source: Ambulatory Visit | Attending: Interventional Cardiology | Admitting: Interventional Cardiology

## 2014-05-21 DIAGNOSIS — I712 Thoracic aortic aneurysm, without rupture, unspecified: Secondary | ICD-10-CM

## 2014-05-21 MED ORDER — GADOBENATE DIMEGLUMINE 529 MG/ML IV SOLN
14.0000 mL | Freq: Once | INTRAVENOUS | Status: AC | PRN
  Administered 2014-05-21: 14 mL via INTRAVENOUS

## 2014-06-01 ENCOUNTER — Ambulatory Visit: Payer: Medicare Other | Admitting: Emergency Medicine

## 2014-06-01 ENCOUNTER — Ambulatory Visit (INDEPENDENT_AMBULATORY_CARE_PROVIDER_SITE_OTHER)
Admission: RE | Admit: 2014-06-01 | Discharge: 2014-06-01 | Disposition: A | Discharge status: Home / Self Care | Payer: Medicare Other | Source: Ambulatory Visit | Attending: Adult Health | Admitting: Adult Health

## 2014-06-01 ENCOUNTER — Ambulatory Visit (INDEPENDENT_AMBULATORY_CARE_PROVIDER_SITE_OTHER): Payer: Medicare Other | Admitting: Adult Health

## 2014-06-01 ENCOUNTER — Encounter (INDEPENDENT_AMBULATORY_CARE_PROVIDER_SITE_OTHER): Payer: Self-pay

## 2014-06-01 ENCOUNTER — Encounter: Payer: Self-pay | Admitting: Adult Health

## 2014-06-01 VITALS — BP 122/86 | HR 98 | Temp 97.7°F | Ht 62.0 in | Wt 159.4 lb

## 2014-06-01 DIAGNOSIS — R053 Chronic cough: Secondary | ICD-10-CM

## 2014-06-01 DIAGNOSIS — R05 Cough: Secondary | ICD-10-CM | POA: Diagnosis not present

## 2014-06-01 DIAGNOSIS — J984 Other disorders of lung: Secondary | ICD-10-CM | POA: Diagnosis not present

## 2014-06-01 DIAGNOSIS — R918 Other nonspecific abnormal finding of lung field: Secondary | ICD-10-CM | POA: Diagnosis not present

## 2014-06-01 DIAGNOSIS — I10 Essential (primary) hypertension: Secondary | ICD-10-CM | POA: Diagnosis not present

## 2014-06-01 DIAGNOSIS — I712 Thoracic aortic aneurysm, without rupture: Secondary | ICD-10-CM | POA: Diagnosis not present

## 2014-06-01 NOTE — Assessment & Plan Note (Signed)
Doing well without flare   Plan  Chest xray today  If you develop cough or shortness of breath please call for follow up  Follow with Dr Lamonte Sakai in 12 months or sooner if you have any problems

## 2014-06-01 NOTE — Patient Instructions (Signed)
Chest xray today  If you develop cough or shortness of breath please call for follow up  Follow with Dr Lamonte Sakai in 12 months or sooner if you have any problems

## 2014-06-01 NOTE — Progress Notes (Signed)
Subjective:    Patient ID: Tiffany Kane, female    DOB: May 24, 1940, 74 y.o.   MRN: [id number] HPI 74 yo remote smoker (3 pk-yrs) with hx HTN, ? Breast CA (with negative post-op path after positive needle bx 2002), allergies and sinus disease. She is referred for eval by Dr Jonny Ruiz for cough and abnormal CXR/CT scan. She was hospitalized early Nov '13 for apparent CAP - experienced fatigue, cough productive of , loss of appetite, pain with cough. She is improved, still with a hacky cough, happens w exertion or with a lot of talking. She has a hx of CT scan chest from 09/2011 and 9/13 that show scattered multilobar small GG opacities, stable on serial exams. Prompted by abnormal CXR in 09/2011 (for cough and ? PNA). She is clinically better than hospital but not back to her baseline of mid-October. She has nasal gtt and sinus fullness. Rarely has GERD sx.    ROV 05/20/12 -- returns to discuss cough and her abnormal CT scan chest. We decided to repeat the scan to look for interval change >> largely stable peripheral GG areas. She feels better - her cough is gone. We started allergy regimen last time; she stopped taking fluticasone spray due to jitteriness.   ROV 06/13/12 -- follows up for cough and GGI on her CT scan chest, last was 05/20/12. She is s/p FOB on 1/13 >> TBBx all normal, AFB negative so far, bacterial > normal flora. She reports feeling well, no cough. No longer on an allergy regimen - may need to restart for the Spring.   ROV 07/16/12 -- follows for cough, abnormal CT scan chest, ? COP with negative bx's and cx's on FOB 05/26/12. Treated with pred 30mg  x 3 weeks. She had side effects >> had jitters, some anxiety, increased appetite. She has CT scan planned for April.   ROV 09/15/12 -- follows for cough, abnormal CT scan chest, ? COP with negative bx's and cx's. She is doing well, having no cough or other sx. A repeat Ct scan chest was done > shows large scale interval improvement in scatter GGI  infiltrates, but some residual disease persists.   ROV 04/24/13 -- follows for cough, abnormal CT scan chest, ? COP with negative bx's and cx's on FOB 05/26/12. Was treated with pred with largescale clearing on CT scan 08/25/12. Her cough is resolved, breathing stable, no limitations.   06/01/2014 ROV Chronic cough, abn CT ? COP w/ neg bx/cx on FOB  Patient returns for one-year follow-up Reports breathing/cough are doing well.  no cough presently. Last chest x-ray showed stable changes in year ago. 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 Cough improved with steroids. Patient denies any fever, chest pain, orthopnea, PND, hemoptysis or cough.     Objective:   Physical Exam Filed Vitals:   06/01/14 1449  BP: 122/86  Pulse: 98  Temp: 97.7 F (36.5 C)  TempSrc: Oral  Height: 5\' 2"  (1.575 m)  Weight: 159 lb 6.4 oz (72.303 kg)  SpO2: 95%   Gen: Pleasant, well-nourished, in no distress,  normal affect  ENT: No lesions,  mouth clear,  oropharynx clear, no postnasal drip  Neck: No JVD, no TMG, no carotid bruits  Lungs: No use of accessory muscles, clear without rales or rhonchi  Cardiovascular: RRR, heart sounds normal, no murmur or gallops, no peripheral edema  Musculoskeletal: No deformities, no cyanosis or clubbing  Neuro: alert, non focal  Skin: Warm, no lesions or rashes  08/25/12 --  Comparison: 04/29/2012  Findings: The thyroid remains nodular and inhomogeneous. Metallic  clip artifact is noted at the left lung apex. The ascending aortic  ectasia measured at the level of the main pulmonary artery is  stable, 4.1 cm image 27. Coronary arterial calcifications and  probable stent noted. Trace pericardial fluid. No  lymphadenopathy.  Incomplete imaging of the upper abdomen demonstrates normal-  appearing adrenal glands and moderate atherosclerotic aortic  calcification.  Lung fields demonstrate the interval improvement in predominately   subpleural geographic wedge-shaped ground-glass opacity. 3 mm  pulmonary nodule abutting the right major fissure image 35 is  stable. Central airways are patent. No acute osseous finding.  Degenerative change is noted in the spine.  High-resolution CT images confirm the above findings. There is  minimal air trapping.  Stable T11 wedge compression deformity.  IMPRESSION:  Interval improvement in subpleural wedge-shaped/geographic ground-  glass airspace opacities, for which primary differential  considerations again include hypersensitivity pneumonitis, DIP,  organizing pneumonia, eosinophilic pneumonia, or less likely  adenocarcinoma. No new nodule, mass, or consolidation.       Assessment & Plan:  No problem-specific assessment & plan notes found for this encounter.

## 2014-06-01 NOTE — Assessment & Plan Note (Signed)
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

## 2014-06-07 ENCOUNTER — Ambulatory Visit: Payer: Medicare Other | Admitting: Interventional Cardiology

## 2014-07-16 ENCOUNTER — Ambulatory Visit (INDEPENDENT_AMBULATORY_CARE_PROVIDER_SITE_OTHER): Payer: Medicare Other | Admitting: Interventional Cardiology

## 2014-07-16 ENCOUNTER — Encounter: Payer: Self-pay | Admitting: Interventional Cardiology

## 2014-07-16 VITALS — BP 142/80 | HR 82 | Ht 62.0 in | Wt 162.0 lb

## 2014-07-16 DIAGNOSIS — I712 Thoracic aortic aneurysm, without rupture, unspecified: Secondary | ICD-10-CM

## 2014-07-16 DIAGNOSIS — I10 Essential (primary) hypertension: Secondary | ICD-10-CM

## 2014-07-16 NOTE — Progress Notes (Signed)
Patient ID: Tiffany Kane, female   DOB: Jan 07, 1941, 74 y.o.   MRN: [id number] Patient ID: Tiffany Kane, female   DOB: 03/08/1941, 74 y.o.   MRN: [id number]    [address], [place], [place]  [address] Phone: [phone] Fax:  [phone]  Date:  07/16/2014   ID:  Tiffany Kane, DOB July 02, 1940, MRN [id number]  PCP:  Jonathon Bellows, MD      History of Present Illness: Tiffany Kane is a 74 y.o. female who has had a thoracic aneurysm. It was stable in size by MRA in 2011. Recent MRA in 12/14 showed a diameter of 4.3 cm of the thoracic aorta.She is exercising every other day. No more  treadmill use now and walks outside some. SHe has not had any chest pain recently. She denies SOB with walking or at rest.   She checks BP at home.  It runs about 130-138/70-80s.  It will be higher during a panic attack...highest 682 systolic.    Wt Readings from Last 3 Encounters:  07/16/14 162 lb (73.483 kg)  06/01/14 159 lb 6.4 oz (72.303 kg)  05/01/13 157 lb 12.8 oz (71.578 kg)     Past Medical History  Diagnosis Date  . Hypertension   . Hyperlipemia   . Depression   . Anxiety   . High cholesterol   . Coronary artery disease   . Esophageal reflux   . Arthritis   . Pneumonia   . Thyromegaly   . Compression fracture     T-11  . Thoracic aneurysm     DR. Leonia Reeves  . Bronchitis   . Hyperlipidemia   . Depression     situational stress  . 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 Osteopenia   . Osteoporosis 03/2006  . H/O ascites 09/2004    Current Outpatient Prescriptions  Medication Sig Dispense Refill  . ALPRAZolam (XANAX) 0.5 MG tablet Take 0.5 mg by mouth Twice daily as needed. For anxiety    . aspirin 81 MG chewable tablet Chew 81 mg by mouth daily.    Marland Kitchen CALCIUM PO Take 1 tablet by mouth daily.    . Cholecalciferol (VITAMIN D3) 2000 UNITS capsule Take 2,000 Units by mouth daily.    . metoprolol succinate (TOPROL-XL) 50 MG 24 hr tablet Take 50  mg by mouth daily. Take with or immediately following a meal.    . simvastatin (ZOCOR) 80 MG tablet Take 40 mg by mouth at bedtime.    . triamterene-hydrochlorothiazide (MAXZIDE-25) 37.5-25 MG per tablet Take 1 tablet by mouth daily.     No current facility-administered medications for this visit.    Allergies:    Allergies  Allergen Reactions  . Lexapro [Escitalopram Oxalate]     CAUSES PANIC ATTACKS  . Prednisone     JITTERY FEELING, UNABLE TO FOCUS  . Zoloft [Sertraline Hcl]     CAUSES PANIC ATTACKS    Social History:  The patient  reports that she quit smoking about 22 years ago. Her smoking use included Cigarettes. She has a 3 pack-year smoking history. She has never used smokeless tobacco. She reports that she drinks alcohol. She reports that she does not use illicit drugs.   Family History:  The patient's family history includes Bone cancer in her father; Breast cancer in her mother; Diabetes in her mother and sister; Heart disease in her brother; Hypertension in her mother and sister; Prostate cancer in her brother  and father.   ROS:  Please see the history of present illness.  No nausea, vomiting.  No fevers, chills.  No focal weakness.  No dysuria.    All other systems reviewed and negative.   PHYSICAL EXAM: VS:  BP 142/80 mmHg  Pulse 82  Ht 5\' 2"  (1.575 m)  Wt 162 lb (73.483 kg)  BMI 29.62 kg/m2 Well nourished, well developed, in no acute distress HEENT: normal Neck: no JVD, no carotid bruits Cardiac:  normal S1, S2; RRR;  Lungs:  clear to auscultation bilaterally, no wheezing, rhonchi or rales Abd: soft, nontender, no hepatomegaly Ext: no edema Skin: warm and dry Neuro:   no focal abnormalities noted Psych: normal affect  EKG:  NSR, no st segment changes    ASSESSMENT AND PLAN:  1. Thoracic aneurysm: Stable by MRA  In 1/16.  Slow rate of growth.  She requests another imaging study in one year.  She gets anxious if we wait longer.  2. HTN: Controlled at home.   Continue beta blocker.   Elevated today.  3. High cholesterol:  Lipids well controlled at most recent check.  LDL 100.  Needs to be checked.  WiIl set up lipid profile.  4. CAD: Nonobstructive by cath in 2001..  Negative stress test in 2010.   5. Recommended walking 5 days a week, 30 minutes per day.  Signed, Mina Marble, MD, [facility] 07/16/2014 3:46 PM

## 2014-07-16 NOTE — Patient Instructions (Signed)
MRA of the chest- will be due in January 2017- we will contact you to schedule this closer to that time.  Your physician wants you to follow-up in: 1 year with Dr. Irish Lack. You will receive a reminder letter in the mail two months in advance. If you don't receive a letter, please call our office to schedule the follow-up appointment.  Your physician recommends that you continue on your current medications as directed. Please refer to the Current Medication list given to you today.

## 2014-07-22 DIAGNOSIS — H2511 Age-related nuclear cataract, right eye: Secondary | ICD-10-CM | POA: Diagnosis not present

## 2014-07-22 DIAGNOSIS — H26492 Other secondary cataract, left eye: Secondary | ICD-10-CM | POA: Diagnosis not present

## 2014-07-22 DIAGNOSIS — H04222 Epiphora due to insufficient drainage, left lacrimal gland: Secondary | ICD-10-CM | POA: Diagnosis not present

## 2014-07-22 DIAGNOSIS — Z961 Presence of intraocular lens: Secondary | ICD-10-CM | POA: Diagnosis not present

## 2014-07-23 ENCOUNTER — Telehealth: Payer: Self-pay | Admitting: Interventional Cardiology

## 2014-07-23 NOTE — Telephone Encounter (Signed)
Calling stating that 2 nights ago felt light headed, flushed.  Took BP 193/99 (has wrist unit). Today was 170/101.  Went to urgent care for them to check BP.  Saw NP and BP there was 165/98 HR 100.  She also told them that she was having some heaviness in (L) arm and leg. No dizziness or lightedness.  Was told by MD to take a Xanax when she got home, to call Dr. Hassell Done office and if symptoms persisted after the Xanax to go to ER.  Calling now stating she did take a Xanax and the heaviness in (L) arm and leg were gone.  BP 164/96 HR 98. No dizziness or flushing. She just wants to know what to do if BP continues to stay elevated and what to do when has the dizziness and flushing.  Spoke w/[employer] who suggests she take the Toprol XL 50 mg twice a day, continue to monitor BP and if develops heaviness, dizziness needs to go to ER.  Advised pt to take the Toprol 50 mg- 1 in AM and 1 in PM. Should get a arm cuff to take BP for more accuracy. States she does have arm unit. Suggested she use that unit and record BP and HR readings.  If BP doesn't come down by first of week to call the office.  If she develops the heaviness or dizziness she needs to go to ER.  She verbalizes understanding and will increase Toprol. Advised needed to keep BP on lower side since she has a thoracic aneurysm.

## 2014-07-23 NOTE — Telephone Encounter (Signed)
New message    Pt c/o BP issue: STAT if pt c/o blurred vision, one-sided weakness or slurred speech  1. What are your last 5 BP readings? 166/102 about 10 min ago . Yesterday  It's was normal 140/84  2. Are you having any other symptoms (ex. Dizziness, headache, blurred vision, passed out)? Left arm weak this am .   3. What is your BP issue? Should medication be increase or go to emergency room.

## 2014-07-27 ENCOUNTER — Telehealth: Payer: Self-pay | Admitting: Interventional Cardiology

## 2014-07-27 NOTE — Telephone Encounter (Signed)
I called and spoke with the patient. She had called at the end of last week with elevated BP readings. See 07/23/14- metoprolol succinate was increased to 50 mg BID by Richardson Dopp, PA. Per the patient, she was at Urgent Care on Friday for a BP check - she was 167/100 and just felt weak. Over the weekend, her BP has averaged 140/88 in the AM before meds and 130/78 after meds. In the PM, she has averaged about 138/85. HR's have been about 78-80 bpm. Today her BP was 144/87 before meds and 130/70 after meds- HR- 80. She has not had any of the dizziness/ nausea/ weakness that she was having last week. She wants to know if she should continue the increased dose of metoprolol. If so, she will need an updated RX sent to the pharmacy (correct pharmacy on file). She just picked up a RX for her toprol yesterday. I advised we could send in a new RX with instructions to put this on file for her. She is agreeable and aware we will be back in touch with her with Dr. Hassell Done recommendations.

## 2014-07-27 NOTE — Telephone Encounter (Signed)
New message     Patient was told to call back speak with nurse regarding medication .

## 2014-07-29 NOTE — Telephone Encounter (Signed)
Agree with increased metoprolol.

## 2014-08-01 ENCOUNTER — Encounter (HOSPITAL_COMMUNITY): Payer: Self-pay

## 2014-08-01 ENCOUNTER — Emergency Department (HOSPITAL_COMMUNITY): Payer: Medicare Other

## 2014-08-01 ENCOUNTER — Emergency Department (HOSPITAL_COMMUNITY)
Admission: EM | Admit: 2014-08-01 | Discharge: 2014-08-02 | Disposition: A | Discharge status: Home / Self Care | Payer: Medicare Other | Attending: Emergency Medicine | Admitting: Emergency Medicine

## 2014-08-01 DIAGNOSIS — J0110 Acute frontal sinusitis, unspecified: Secondary | ICD-10-CM | POA: Diagnosis not present

## 2014-08-01 DIAGNOSIS — Z79899 Other long term (current) drug therapy: Secondary | ICD-10-CM | POA: Insufficient documentation

## 2014-08-01 DIAGNOSIS — Z8709 Personal history of other diseases of the respiratory system: Secondary | ICD-10-CM | POA: Insufficient documentation

## 2014-08-01 DIAGNOSIS — Z853 Personal history of malignant neoplasm of breast: Secondary | ICD-10-CM | POA: Diagnosis not present

## 2014-08-01 DIAGNOSIS — J984 Other disorders of lung: Secondary | ICD-10-CM | POA: Diagnosis not present

## 2014-08-01 DIAGNOSIS — I2510 Atherosclerotic heart disease of native coronary artery without angina pectoris: Secondary | ICD-10-CM | POA: Diagnosis not present

## 2014-08-01 DIAGNOSIS — Z7982 Long term (current) use of aspirin: Secondary | ICD-10-CM | POA: Insufficient documentation

## 2014-08-01 DIAGNOSIS — Z8719 Personal history of other diseases of the digestive system: Secondary | ICD-10-CM | POA: Diagnosis not present

## 2014-08-01 DIAGNOSIS — Z8701 Personal history of pneumonia (recurrent): Secondary | ICD-10-CM | POA: Diagnosis not present

## 2014-08-01 DIAGNOSIS — Z8781 Personal history of (healed) traumatic fracture: Secondary | ICD-10-CM | POA: Insufficient documentation

## 2014-08-01 DIAGNOSIS — F419 Anxiety disorder, unspecified: Secondary | ICD-10-CM | POA: Insufficient documentation

## 2014-08-01 DIAGNOSIS — I10 Essential (primary) hypertension: Secondary | ICD-10-CM | POA: Insufficient documentation

## 2014-08-01 DIAGNOSIS — Z9889 Other specified postprocedural states: Secondary | ICD-10-CM | POA: Insufficient documentation

## 2014-08-01 DIAGNOSIS — J0100 Acute maxillary sinusitis, unspecified: Secondary | ICD-10-CM | POA: Diagnosis not present

## 2014-08-01 DIAGNOSIS — M1990 Unspecified osteoarthritis, unspecified site: Secondary | ICD-10-CM | POA: Insufficient documentation

## 2014-08-01 DIAGNOSIS — Z87891 Personal history of nicotine dependence: Secondary | ICD-10-CM | POA: Insufficient documentation

## 2014-08-01 DIAGNOSIS — E785 Hyperlipidemia, unspecified: Secondary | ICD-10-CM | POA: Insufficient documentation

## 2014-08-01 DIAGNOSIS — R42 Dizziness and giddiness: Secondary | ICD-10-CM | POA: Diagnosis not present

## 2014-08-01 DIAGNOSIS — R404 Transient alteration of awareness: Secondary | ICD-10-CM | POA: Diagnosis not present

## 2014-08-01 DIAGNOSIS — R29898 Other symptoms and signs involving the musculoskeletal system: Secondary | ICD-10-CM | POA: Diagnosis not present

## 2014-08-01 DIAGNOSIS — F329 Major depressive disorder, single episode, unspecified: Secondary | ICD-10-CM | POA: Insufficient documentation

## 2014-08-01 LAB — CBC WITH DIFFERENTIAL/PLATELET
Basophils Absolute: 0 10*3/uL (ref 0.0–0.1)
Basophils Relative: 0 % (ref 0–1)
Eosinophils Absolute: 0.2 10*3/uL (ref 0.0–0.7)
Eosinophils Relative: 2 % (ref 0–5)
HCT: 41.8 % (ref 36.0–46.0)
Hemoglobin: 14.5 g/dL (ref 12.0–15.0)
LYMPHS ABS: 2.1 10*3/uL (ref 0.7–4.0)
LYMPHS PCT: 19 % (ref 12–46)
MCH: 30.1 pg (ref 26.0–34.0)
MCHC: 34.7 g/dL (ref 30.0–36.0)
MCV: 86.7 fL (ref 78.0–100.0)
Monocytes Absolute: 0.9 10*3/uL (ref 0.1–1.0)
Monocytes Relative: 8 % (ref 3–12)
NEUTROS PCT: 71 % (ref 43–77)
Neutro Abs: 7.9 10*3/uL — ABNORMAL HIGH (ref 1.7–7.7)
PLATELETS: 227 10*3/uL (ref 150–400)
RBC: 4.82 MIL/uL (ref 3.87–5.11)
RDW: 12.9 % (ref 11.5–15.5)
WBC: 11.1 10*3/uL — ABNORMAL HIGH (ref 4.0–10.5)

## 2014-08-01 LAB — COMPREHENSIVE METABOLIC PANEL
ALBUMIN: 4 g/dL (ref 3.5–5.2)
ALT: 14 U/L (ref 0–35)
AST: 25 U/L (ref 0–37)
Alkaline Phosphatase: 79 U/L (ref 39–117)
Anion gap: 5 (ref 5–15)
BUN: 17 mg/dL (ref 6–23)
CHLORIDE: 105 mmol/L (ref 96–112)
CO2: 31 mmol/L (ref 19–32)
Calcium: 9.3 mg/dL (ref 8.4–10.5)
Creatinine, Ser: 0.84 mg/dL (ref 0.50–1.10)
GFR calc Af Amer: 76 mL/min — ABNORMAL LOW (ref >90)
GFR calc non Af Amer: 66 mL/min — ABNORMAL LOW (ref >90)
Glucose, Bld: 107 mg/dL — ABNORMAL HIGH (ref 70–99)
POTASSIUM: 3.5 mmol/L (ref 3.5–5.1)
SODIUM: 141 mmol/L (ref 135–145)
Total Bilirubin: 0.6 mg/dL (ref 0.3–1.2)
Total Protein: 6.9 g/dL (ref 6.0–8.3)

## 2014-08-01 LAB — I-STAT TROPONIN, ED: Troponin i, poc: 0 ng/mL (ref 0.00–0.08)

## 2014-08-01 NOTE — ED Provider Notes (Signed)
CSN: [id number]     Arrival date & time 08/01/14  2125 History   First MD Initiated Contact with Patient 08/01/14 2154     Chief Complaint  Patient presents with  . Dizziness     (Consider location/radiation/quality/duration/timing/severity/associated sxs/prior Treatment) Patient is a 74 y.o. female presenting with dizziness. The history is provided by the patient.  Dizziness Quality:  Lightheadedness Severity:  Moderate Onset quality:  Gradual Timing:  Constant Progression:  Resolved Chronicity:  Recurrent Context: not when bending over   Relieved by:  Nothing Worsened by:  Nothing Ineffective treatments:  None tried Associated symptoms: no blood in stool, no tinnitus and no weakness   Risk factors: no anemia   No CP no SOB no DOE no Weakness nor numbness no changes in vision or speech missed am dose of BP meds and BP spiked.  BP meds taken this evening and symptoms have resolved  Past Medical History  Diagnosis Date  . Hypertension   . Hyperlipemia   . Depression   . Anxiety   . High cholesterol   . Coronary artery disease   . Esophageal reflux   . Arthritis   . Pneumonia   . Thyromegaly   . Compression fracture     T-11  . Thoracic aneurysm     DR. Leonia Reeves  . Bronchitis   . Hyperlipidemia   . Depression     situational stress  . 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 Osteopenia   . Osteoporosis 03/2006  . H/O ascites 09/2004   Past Surgical History  Procedure Laterality Date  . Cervical spine surgery    . Abdominal hysterectomy    . Breast biopsy    . Video bronchoscopy  05/26/2012    Procedure: VIDEO BRONCHOSCOPY WITH FLUORO;  Surgeon: Collene Gobble, MD;  Location: [facility];  Service: Cardiopulmonary;  Laterality: Bilateral;  . Cardiac catheterization  06/1999   Family History  Problem Relation Age of Onset  . Breast cancer Mother   . Diabetes Mother   . Hypertension Mother   . Bone cancer Father   . Prostate cancer Father   . Heart  disease Brother   . Prostate cancer Brother   . Hypertension Sister   . Diabetes Sister    History  Substance Use Topics  . Smoking status: Former Smoker -- 0.50 packs/day for 6 years    Types: Cigarettes    Quit date: 05/14/1992  . Smokeless tobacco: Never Used  . Alcohol Use: Yes     Comment: RARELY /once year    OB History    No data available     Review of Systems  Constitutional: Negative for fever.  HENT: Negative for tinnitus.   Gastrointestinal: Negative for blood in stool.  Neurological: Positive for dizziness and light-headedness. Negative for tremors, seizures, syncope, facial asymmetry, speech difficulty, weakness and numbness.  All other systems reviewed and are negative.     Allergies  Demerol; Lexapro; Prednisone; and Zoloft  Home Medications   Prior to Admission medications   Medication Sig Start Date End Date Taking? Authorizing Provider  ALPRAZolam Duanne Moron) 0.5 MG tablet Take 0.5 mg by mouth Twice daily as needed. For anxiety 01/01/12   Historical Provider, MD  aspirin 81 MG chewable tablet Chew 81 mg by mouth daily.    Historical Provider, MD  CALCIUM PO Take 1 tablet by mouth daily.    Historical Provider, MD  Cholecalciferol (VITAMIN D3) 2000 UNITS capsule Take 2,000  Units by mouth daily.    Historical Provider, MD  metoprolol succinate (TOPROL-XL) 50 MG 24 hr tablet Take 50 mg by mouth 2 (two) times daily. Take with or immediately following a meal.    Historical Provider, MD  simvastatin (ZOCOR) 80 MG tablet Take 40 mg by mouth at bedtime.    Historical Provider, MD  triamterene-hydrochlorothiazide (MAXZIDE-25) 37.5-25 MG per tablet Take 1 tablet by mouth daily.    Historical Provider, MD   BP 124/66 mmHg  Pulse 72  Temp(Src) 98 F (36.7 C) (Oral)  Resp 14  SpO2 99% Physical Exam  Constitutional: She is oriented to person, place, and time. She appears well-developed and well-nourished. No distress.  HENT:  Head: Normocephalic and atraumatic.   Mouth/Throat: Oropharynx is clear and moist.  Eyes: Conjunctivae and EOM are normal. Pupils are equal, round, and reactive to light.  Neck: Normal range of motion. Neck supple.  Cardiovascular: Normal rate, regular rhythm and intact distal pulses.   Pulmonary/Chest: Effort normal and breath sounds normal. No respiratory distress. She has no wheezes. She has no rales. She exhibits no tenderness.  Abdominal: Soft. Bowel sounds are normal. There is no tenderness. There is no rebound and no guarding.  Musculoskeletal: Normal range of motion. She exhibits no edema or tenderness.  Neurological: She is alert and oriented to person, place, and time. She has normal reflexes. She displays normal reflexes. No cranial nerve deficit. She exhibits normal muscle tone. Coordination normal.  Skin: Skin is warm and dry.  Psychiatric: She has a normal mood and affect.    ED Course  Procedures (including critical care time) Labs Review Labs Reviewed  CBC WITH DIFFERENTIAL/PLATELET - Abnormal; Notable for the following:    WBC 11.1 (*)    Neutro Abs 7.9 (*)    All other components within normal limits  COMPREHENSIVE METABOLIC PANEL - Abnormal; Notable for the following:    Glucose, Bld 107 (*)    GFR calc non Af Amer 66 (*)    GFR calc Af Amer 76 (*)    All other components within normal limits  I-STAT TROPOININ, ED    Imaging Review No results found.   EKG Interpretation   Date/Time:  Sunday August 01 2014 21:41:16 EDT Ventricular Rate:  82 PR Interval:  154 QRS Duration: 80 QT Interval:  382 QTC Calculation: 446 R Axis:   -20 Text Interpretation:  Normal sinus rhythm Low voltage QRS Cannot rule out  Anterior infarct , age undetermined Abnormal ECG No significant change  since last tracing Confirmed by YAO  MD, DAVID ([id number]) on 08/01/2014  9:54:59 PM      MDM   Final diagnoses:  None   Ruled out for MI.  Highly doubt neuro process without numbness or weakness  Lightheadedness  associated elevated BP now normal.  No symptoms.  Follow up with your doctor in the am   Amil Moseman, MD 08/02/14 0145

## 2014-08-01 NOTE — ED Notes (Signed)
Pt ambulated to the bathroom without assistance. 

## 2014-08-01 NOTE — ED Notes (Signed)
Pt reports around 8pm started feeling lightheaded, nauseated, heaviness/pressure in left arm, left leg.  Fire took BP 172/108.  Upon arrival to ED pt still having some heaviness in left arm and left leg, nausea resolved, lightheadedness improved.  No chest pain, shortness of breath.  EMS BP 153/87.  Pt missed am dose of Metropolol, took pm dose @ 7:30pm.  Pt reports got up late, went to church without taking medication.  Pt had same symptoms 1 week ago and pm dose of Metropolol added to medication regime.

## 2014-08-02 ENCOUNTER — Encounter (HOSPITAL_COMMUNITY): Payer: Self-pay | Admitting: Emergency Medicine

## 2014-08-02 ENCOUNTER — Emergency Department (HOSPITAL_COMMUNITY): Payer: Medicare Other

## 2014-08-02 DIAGNOSIS — J984 Other disorders of lung: Secondary | ICD-10-CM | POA: Diagnosis not present

## 2014-08-02 LAB — URINALYSIS, ROUTINE W REFLEX MICROSCOPIC
Bilirubin Urine: NEGATIVE
Glucose, UA: NEGATIVE mg/dL
Hgb urine dipstick: NEGATIVE
Ketones, ur: NEGATIVE mg/dL
NITRITE: NEGATIVE
PH: 7 (ref 5.0–8.0)
Protein, ur: NEGATIVE mg/dL
SPECIFIC GRAVITY, URINE: 1.008 (ref 1.005–1.030)
UROBILINOGEN UA: 0.2 mg/dL (ref 0.0–1.0)

## 2014-08-02 LAB — I-STAT TROPONIN, ED: Troponin i, poc: 0 ng/mL (ref 0.00–0.08)

## 2014-08-02 LAB — URINE MICROSCOPIC-ADD ON

## 2014-08-02 MED ORDER — METOPROLOL SUCCINATE ER 50 MG PO TB24: 50.0000 mg | ORAL_TABLET | Freq: Two times a day (BID) | ORAL | Status: DC

## 2014-08-02 NOTE — Telephone Encounter (Signed)
Spoke with pt and informed her that Dr. Irish Lack was in agreement with this increased dose of Metoprolol (Metoprolol Succinate 50mg  BID). Verified pharmacy and informed pt that I would get prescription sent over. Pt verbalized understanding and was in agreement with this plan.

## 2014-08-02 NOTE — Discharge Instructions (Signed)
DASH Eating Plan °DASH stands for "Dietary Approaches to Stop Hypertension." The DASH eating plan is a healthy eating plan that has been shown to reduce high blood pressure (hypertension). Additional health benefits may include reducing the risk of type 2 diabetes mellitus, heart disease, and stroke. The DASH eating plan may also help with weight loss. °WHAT DO I NEED TO KNOW ABOUT THE DASH EATING PLAN? °For the DASH eating plan, you will follow these general guidelines: °· Choose foods with a percent daily value for sodium of less than 5% (as listed on the food label). °· Use salt-free seasonings or herbs instead of table salt or sea salt. °· Check with your health care provider or pharmacist before using salt substitutes. °· Eat lower-sodium products, often labeled as "lower sodium" or "no salt added." °· Eat fresh foods. °· Eat more vegetables, fruits, and low-fat dairy products. °· Choose whole grains. Look for the word "whole" as the first word in the ingredient list. °· Choose fish and skinless chicken or turkey more often than red meat. Limit fish, poultry, and meat to 6 oz (170 g) each day. °· Limit sweets, desserts, sugars, and sugary drinks. °· Choose heart-healthy fats. °· Limit cheese to 1 oz (28 g) per day. °· Eat more home-cooked food and less restaurant, buffet, and fast food. °· Limit fried foods. °· Cook foods using methods other than frying. °· Limit canned vegetables. If you do use them, rinse them well to decrease the sodium. °· When eating at a restaurant, ask that your food be prepared with less salt, or no salt if possible. °WHAT FOODS CAN I EAT? °Seek help from a dietitian for individual calorie needs. °Grains °Whole grain or whole wheat bread. Brown rice. Whole grain or whole wheat pasta. Quinoa, bulgur, and whole grain cereals. Low-sodium cereals. Corn or whole wheat flour tortillas. Whole grain cornbread. Whole grain crackers. Low-sodium crackers. °Vegetables °Fresh or frozen vegetables  (raw, steamed, roasted, or grilled). Low-sodium or reduced-sodium tomato and vegetable juices. Low-sodium or reduced-sodium tomato sauce and paste. Low-sodium or reduced-sodium canned vegetables.  °Fruits °All fresh, canned (in natural juice), or frozen fruits. °Meat and Other Protein Products °Ground beef (85% or leaner), grass-fed beef, or beef trimmed of fat. Skinless chicken or turkey. Ground chicken or turkey. Pork trimmed of fat. All fish and seafood. Eggs. Dried beans, peas, or lentils. Unsalted nuts and seeds. Unsalted canned beans. °Dairy °Low-fat dairy products, such as skim or 1% milk, 2% or reduced-fat cheeses, low-fat ricotta or cottage cheese, or plain low-fat yogurt. Low-sodium or reduced-sodium cheeses. °Fats and Oils °Tub margarines without trans fats. Light or reduced-fat mayonnaise and salad dressings (reduced sodium). Avocado. Safflower, olive, or canola oils. Natural peanut or almond butter. °Other °Unsalted popcorn and pretzels. °The items listed above may not be a complete list of recommended foods or beverages. Contact your dietitian for more options. °WHAT FOODS ARE NOT RECOMMENDED? °Grains °White bread. White pasta. White rice. Refined cornbread. Bagels and croissants. Crackers that contain trans fat. °Vegetables °Creamed or fried vegetables. Vegetables in a cheese sauce. Regular canned vegetables. Regular canned tomato sauce and paste. Regular tomato and vegetable juices. °Fruits °Dried fruits. Canned fruit in light or heavy syrup. Fruit juice. °Meat and Other Protein Products °Fatty cuts of meat. Ribs, chicken wings, bacon, sausage, bologna, salami, chitterlings, fatback, hot dogs, bratwurst, and packaged luncheon meats. Salted nuts and seeds. Canned beans with salt. °Dairy °Whole or 2% milk, cream, half-and-half, and cream cheese. Whole-fat or sweetened yogurt. Full-fat   cheeses or blue cheese. Nondairy creamers and whipped toppings. Processed cheese, cheese spreads, or cheese  curds. Condiments Onion and garlic salt, seasoned salt, table salt, and sea salt. Canned and packaged gravies. Worcestershire sauce. Tartar sauce. Barbecue sauce. Teriyaki sauce. Soy sauce, including reduced sodium. Steak sauce. Fish sauce. Oyster sauce. Cocktail sauce. Horseradish. Ketchup and mustard. Meat flavorings and tenderizers. Bouillon cubes. Hot sauce. Tabasco sauce. Marinades. Taco seasonings. Relishes. Fats and Oils Butter, stick margarine, lard, shortening, ghee, and bacon fat. Coconut, palm kernel, or palm oils. Regular salad dressings. Other Pickles and olives. Salted popcorn and pretzels. The items listed above may not be a complete list of foods and beverages to avoid. Contact your dietitian for more information. WHERE CAN I FIND MORE INFORMATION? National Heart, Lung, and Blood Institute: travelstabloid.com Document Released: 04/19/2011 Document Revised: 09/14/2013 Document Reviewed: 03/04/2013 [facility] Patient Information 2015 [place], [place]. This information is not intended to replace advice given to you by your health care provider. Make sure you discuss any questions you have with your health care provider.  Myocardial Infarction A myocardial infarction (MI) is also called a heart attack. It causes damage to the heart that cannot be fixed. An MI often happens when a blood clot or other blockage cuts blood flow to the heart. When this happens, certain areas of the heart begin to die. This is an emergency. HOME CARE  Take medicine as told by your doctor.  Change certain behaviors as told by your doctor. This may include:  Quitting smoking.  Being active.  Keeping a healthy weight.  Eating a heart-healthy diet. Ask your doctor for help with this diet.  Keeping your diabetes under control.  Lessening stress.  Limiting how much alcohol you drink. GET HELP RIGHT AWAY IF:  You have crushing or pressure-like chest pain that spreads  to the arms, back, neck, or jaw. Call your local emergency services (911 in [place] Do not drive yourself to the hospital.  You have severe chest pain.  You have shortness of breath during rest, sleep, or with activity.  You have sudden sweating or clammy skin.  You feel sick to your stomach (nauseous) and throw up (vomit).  You suddenly get lightheaded or dizzy.  You feel your heart beating fast or skipping beats. MAKE SURE YOU:   Understand these instructions.  Will watch your condition.  Will get help right away if you are not doing well or get worse. Document Released: 10/30/2011 Document Reviewed: 07/03/2013 [facility] Patient Information 2015 [place]. This information is not intended to replace advice given to you by your health care provider. Make sure you discuss any questions you have with your health care provider.

## 2014-08-02 NOTE — ED Notes (Signed)
MD at bedside updating patient.

## 2014-08-05 DIAGNOSIS — I10 Essential (primary) hypertension: Secondary | ICD-10-CM | POA: Diagnosis not present

## 2014-09-04 IMAGING — CT CT CHEST W/O CM
2 of 6 series · 13 of 36 positions shown, 16 images · non-contrast
Comparison: Chest x-ray of 03/21/2012 and CT chest of 01/29/2012

CLINICAL DATA: Ground-glass opacities on chest x-ray, former
smoking history, history of breast carcinoma

CT CHEST WITHOUT CONTRAST
TECHNIQUE: Multidetector CT imaging of the chest was performed
following the standard protocol without IV contrast.

[Series 2: chest w/o · axial · non-contrast · 0.70mm/px · z∈[-250,-20]mm · 10 of 58 slices shown, 13 images]
[im 6/58  mediastinal]
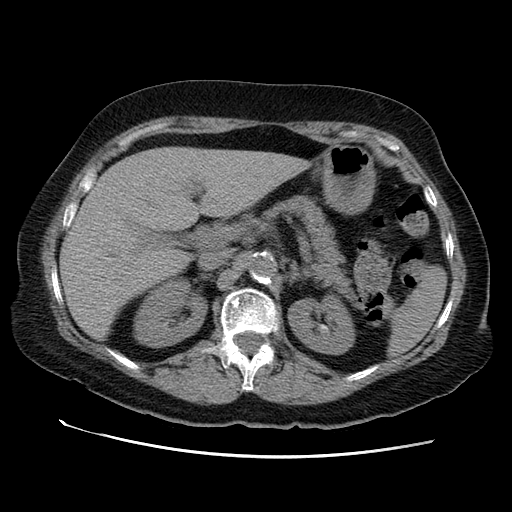
[im 6/58  lung]
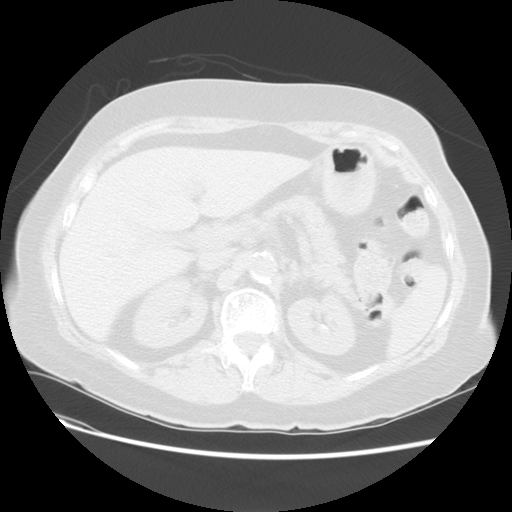
[im 11/58  lung]
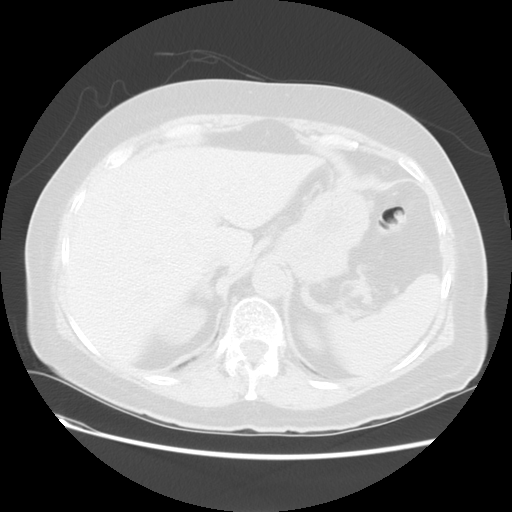
[im 16/58  lung]
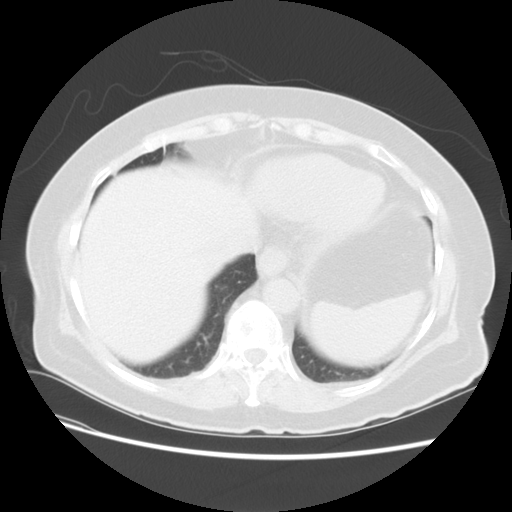
[im 21/58  lung]
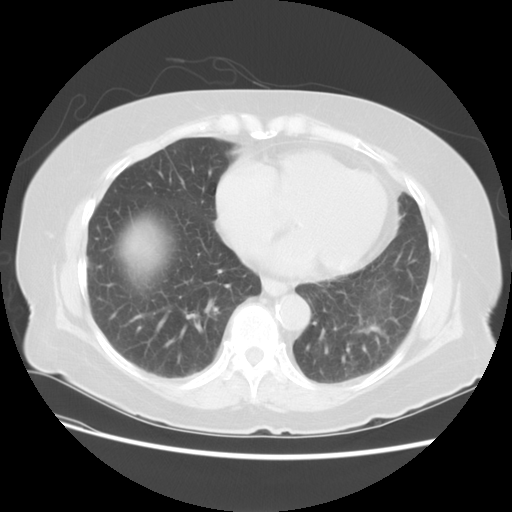
[im 26/58  mediastinal]
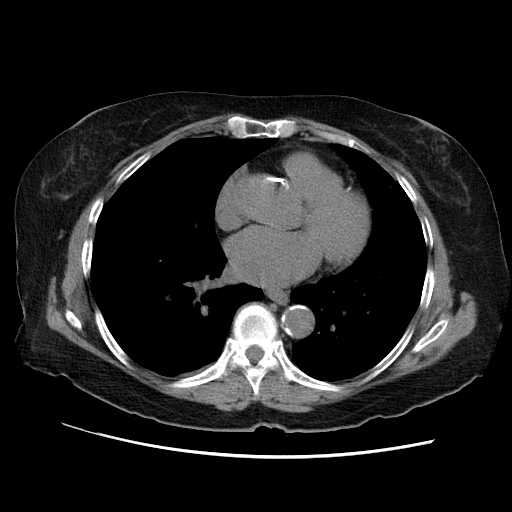
[im 26/58  lung]
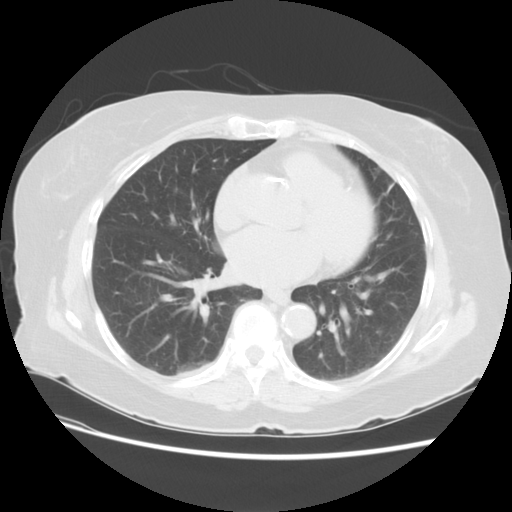
[im 32/58  lung]
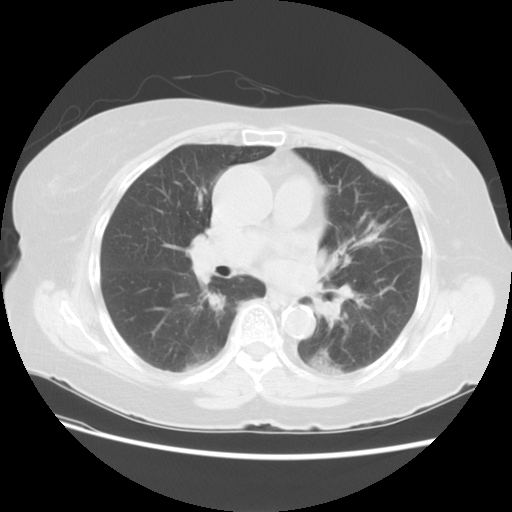
[im 37/58  lung]
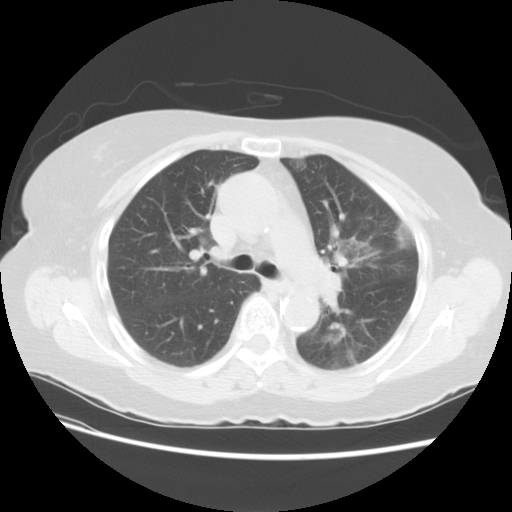
[im 42/58  lung]
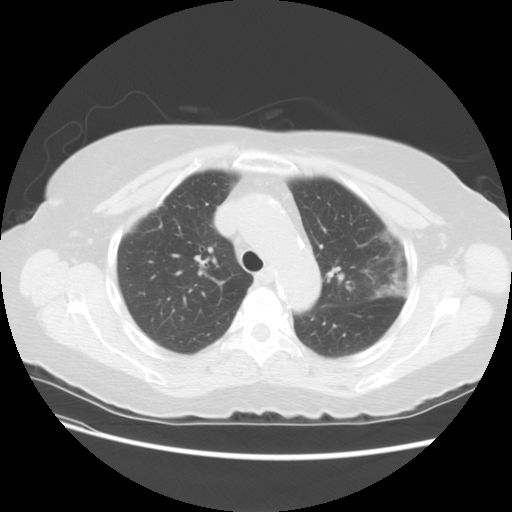
[im 47/58  mediastinal]
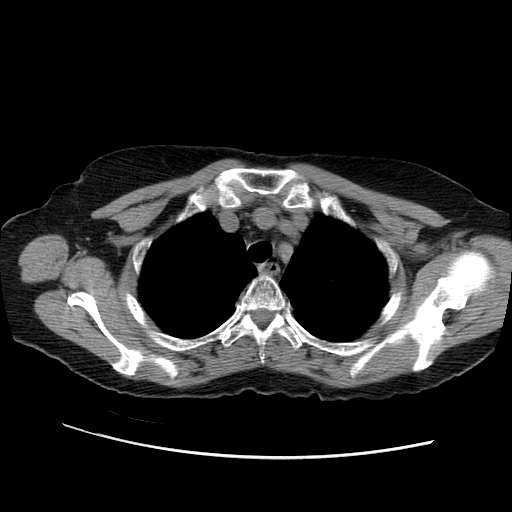
[im 47/58  lung]
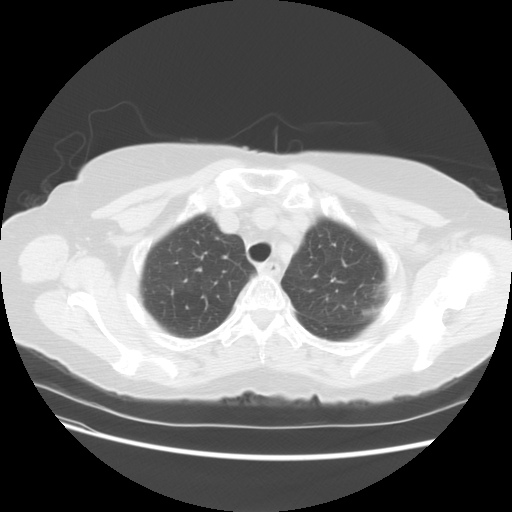
[im 52/58  lung]
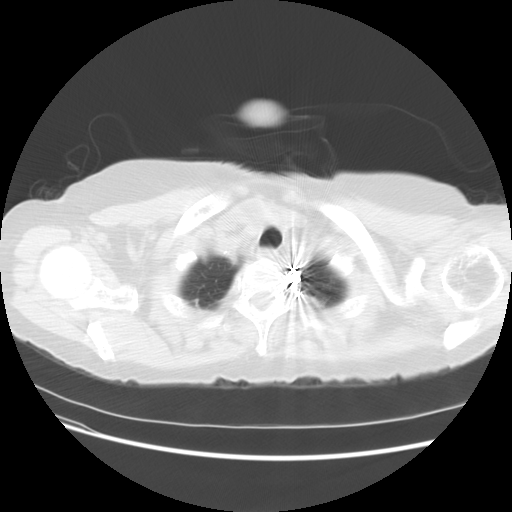

[Series 400: cor · coronal · 0.70mm/px · 3 of 104 slices shown]
[im 21/104  lung]
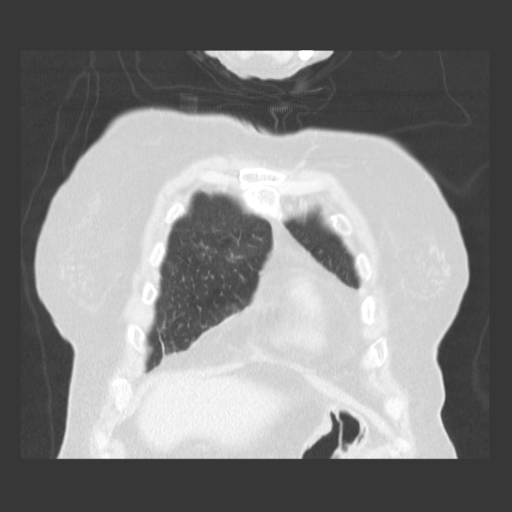
[im 42/104  lung]
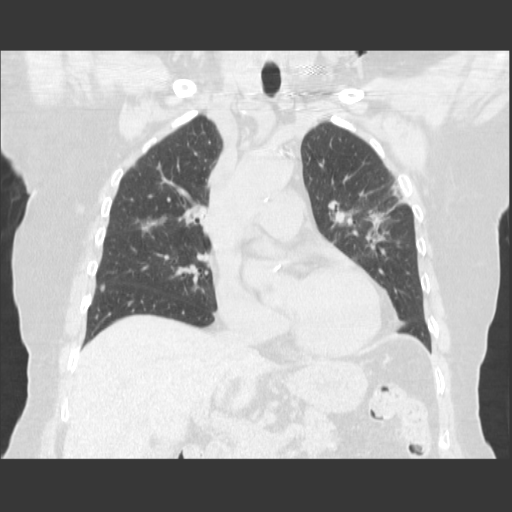
[im 62/104  lung]
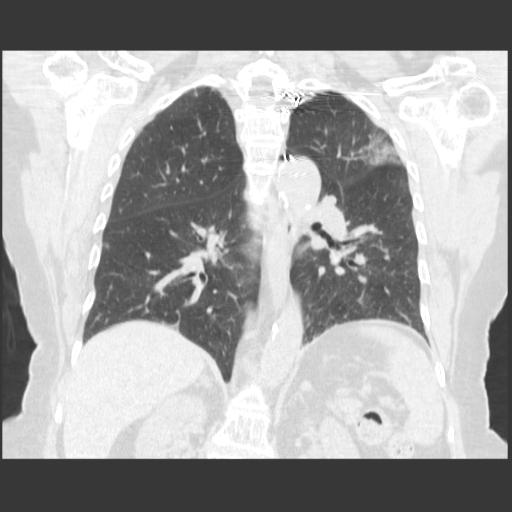

[13 of 36 positions shown; findings below may reference images not displayed]

FINDINGS: On the lung window images, many of the previously
described ground-glass opacities are again noted diffusely
throughout the lungs and are relatively stable.  A few areas of
increased somewhat particularly within the left upper lobe and
right upper lobe. In addition, there is new ground-glass opacity
within the left upper lobe near the apex laterally. The majority of
these ground-glass opacities are peripheral in location. In view of
the peripheral location and the waxing and waning of some of these
opacities, a subacute / chronic process such as organizing
pneumonia would be a consideration.  Other entities would be
nonspecific interstitial pneumonitis, usual interstitial
pneumonitis and eosinophilic pneumonia, all of which can be
subacute and chronic.  Low grade adenocarcinoma remains in the
differential but entities such as organizing pneumonia would be
favored. Clinically correlate motion is recommended.  No effusion
is seen.  The central airway is patent.

On soft tissue window images, asymmetric enlargement of the right
lobe of thyroid is noted with apparent prior left thyroidectomy.
On this unenhanced study, no mediastinal or hilar adenopathy is
seen.  Coronary artery calcifications are noted particularly in the
distribution of the left anterior descending artery.  Prominence of
the ascending aorta is again noted measuring 41 mm in diameter.
Some contrast is noted within the renal collecting systems of
questionable significance.
IMPRESSION: 1.  The majority of the previously described ground-glass opacities
are peripheral in location and stable although there are new areas
of ground-glass opacity also present bilaterally.  The primary
considerations are that of organizing pneumonia such as BOOP,
nonspecific interstitial pneumonitis, usual interstitial
pneumonitis, or possibly chronic eosinophilic pneumonia.  Low grade
adenocarcinoma remains within the differential.
2.  Coronary artery calcifications.

## 2014-09-14 DIAGNOSIS — R05 Cough: Secondary | ICD-10-CM | POA: Diagnosis not present

## 2014-09-21 ENCOUNTER — Other Ambulatory Visit: Payer: Self-pay | Admitting: Family Medicine

## 2014-09-21 ENCOUNTER — Ambulatory Visit
Admission: RE | Admit: 2014-09-21 | Discharge: 2014-09-21 | Disposition: A | Discharge status: Home / Self Care | Payer: Medicare Other | Source: Ambulatory Visit | Attending: Family Medicine | Admitting: Family Medicine

## 2014-09-21 DIAGNOSIS — R05 Cough: Secondary | ICD-10-CM

## 2014-09-21 DIAGNOSIS — R059 Cough, unspecified: Secondary | ICD-10-CM

## 2014-10-26 ENCOUNTER — Other Ambulatory Visit: Payer: Self-pay | Admitting: Family Medicine

## 2014-10-26 ENCOUNTER — Ambulatory Visit
Admission: RE | Admit: 2014-10-26 | Discharge: 2014-10-26 | Disposition: A | Discharge status: Home / Self Care | Payer: Medicare Other | Source: Ambulatory Visit | Attending: Family Medicine | Admitting: Family Medicine

## 2014-10-26 DIAGNOSIS — R9389 Abnormal findings on diagnostic imaging of other specified body structures: Secondary | ICD-10-CM

## 2014-10-26 DIAGNOSIS — R918 Other nonspecific abnormal finding of lung field: Secondary | ICD-10-CM | POA: Diagnosis not present

## 2014-10-29 ENCOUNTER — Other Ambulatory Visit: Payer: Self-pay | Admitting: Family Medicine

## 2014-10-29 DIAGNOSIS — R9389 Abnormal findings on diagnostic imaging of other specified body structures: Secondary | ICD-10-CM

## 2014-11-02 ENCOUNTER — Ambulatory Visit
Admission: RE | Admit: 2014-11-02 | Discharge: 2014-11-02 | Disposition: A | Discharge status: Home / Self Care | Payer: Medicare Other | Source: Ambulatory Visit | Attending: Family Medicine | Admitting: Family Medicine

## 2014-11-02 DIAGNOSIS — R05 Cough: Secondary | ICD-10-CM | POA: Diagnosis not present

## 2014-11-02 DIAGNOSIS — R0602 Shortness of breath: Secondary | ICD-10-CM | POA: Diagnosis not present

## 2014-11-02 DIAGNOSIS — R9389 Abnormal findings on diagnostic imaging of other specified body structures: Secondary | ICD-10-CM

## 2014-11-02 DIAGNOSIS — I712 Thoracic aortic aneurysm, without rupture: Secondary | ICD-10-CM | POA: Diagnosis not present

## 2014-11-08 ENCOUNTER — Telehealth: Payer: Self-pay | Admitting: Emergency Medicine

## 2014-11-08 NOTE — Telephone Encounter (Signed)
Spoke with Rodena Piety at [facility]. States that pt needs to be seen ASAP for possible PNA. Advised her that RB is not in the office for the next 2 weeks. TP is also out of the office this week on vacation. Pt has been scheduled with MW on 11/11/14 at 2pm. Nothing further was needed. Rodena Piety is going to contact pt about appointment.

## 2014-11-11 ENCOUNTER — Encounter: Payer: Self-pay | Admitting: Internal Medicine

## 2014-11-11 ENCOUNTER — Other Ambulatory Visit (INDEPENDENT_AMBULATORY_CARE_PROVIDER_SITE_OTHER): Payer: Medicare Other

## 2014-11-11 ENCOUNTER — Ambulatory Visit (INDEPENDENT_AMBULATORY_CARE_PROVIDER_SITE_OTHER): Payer: Medicare Other | Admitting: Internal Medicine

## 2014-11-11 DIAGNOSIS — J8489 Other specified interstitial pulmonary diseases: Secondary | ICD-10-CM | POA: Diagnosis not present

## 2014-11-11 LAB — SEDIMENTATION RATE: Sed Rate: 47 mm/hr — ABNORMAL HIGH (ref 0–22)

## 2014-11-11 MED ORDER — PREDNISONE 10 MG PO TABS: ORAL_TABLET | ORAL | Status: DC

## 2014-11-11 NOTE — Progress Notes (Signed)
Subjective:    Patient ID: Tiffany Kane, female    DOB: [birth year], 74 y.o.   MRN: [id number] HPI 74 yo remote smoker (3 pk-yrs) with hx HTN, ? Breast CA (with negative post-op path after positive needle bx 2002), allergies and sinus disease. She is referred for eval by Dr Jonny Ruiz for cough and abnormal CXR/CT scan. She was hospitalized early Nov '13 for apparent CAP - experienced fatigue, cough productive of , loss of appetite, pain with cough. She is improved, still with a hacky cough, happens w exertion or with a lot of talking. She has a hx of CT scan chest from 09/2011 and 9/13 that show scattered multilobar small GG opacities, stable on serial exams. Prompted by abnormal CXR in 09/2011 (for cough and ? PNA). She is clinically better than hospital but not back to her baseline of mid-October. She has nasal gtt and sinus fullness. Rarely has GERD sx.    ROV 05/20/12 -- returns to discuss cough and her abnormal CT scan chest. We decided to repeat the scan to look for interval change >> largely stable peripheral GG areas. She feels better - her cough is gone. We started allergy regimen last time; she stopped taking fluticasone spray due to jitteriness.   ROV 06/13/12 -- follows up for cough and GGI on her CT scan chest, last was 05/20/12. She is s/p FOB on 1/13 >> TBBx all normal, AFB negative so far, bacterial > normal flora. She reports feeling well, no cough. No longer on an allergy regimen - may need to restart for the Spring.   ROV 07/16/12 -- follows for cough, abnormal CT scan chest, ? COP with negative bx's and cx's on FOB 05/26/12. Treated with pred 30mg  x 3 weeks. She had side effects >> had jitters, some anxiety, increased appetite. She has CT scan planned for April.   ROV 09/15/12 -- follows for cough, abnormal CT scan chest, ? COP with negative bx's and cx's. She is doing well, having no cough or other sx. A repeat Ct scan chest was done > shows large scale interval improvement in scatter GGI  infiltrates, but some residual disease persists.   ROV 04/24/13 -- follows for cough, abnormal CT scan chest, ? COP with negative bx's and cx's on FOB 05/26/12. Was treated with pred with large scale clearing on CT scan 08/25/12. Her cough is resolved, breathing stable, no limitations.   06/01/2014 ROV Chronic cough, abn CT ? COP w/ neg bx/cx on FOB  Patient returns for one-year follow-up Reports breathing/cough are doing well.  no cough presently. Last chest x-ray showed stable changes in year ago. 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 Cough improved with steroids. rec  Chest xray today  If you develop cough or shortness of breath please call for follow up > did not do    11/11/2014 f/u ov/Wert re: acute cough/ abn ct chest Chief Complaint  Patient presents with  . Follow-up    Dr Agustina Caroli pt. She is here per the request of Dr Justin Mend b/c of abnormal ct chest done 11/02/14.  She was dxed with PNA in May 2016- cough, SOB and fatigue have been progressively worse since then. Cough is prod with minimal clear sputum.   abrupt onset late May 2016  cough/ slt fever mucus sorta yellow / midline pain ant chest with coughing  > feel better zpak/ no better then ? levaquin  > some  better  From the original illness but now doe x  room to room which is new -   No obvious day to day or daytime variabilty or assoc excess /purulent sputum  or cp or chest tightness, subjective wheeze overt sinus or hb symptoms. No unusual exp hx or h/o childhood pna/ asthma or knowledge of premature birth.  Sleeping ok without nocturnal  or early am exacerbation  of respiratory  c/o's or need for noct saba. Also denies any obvious fluctuation of symptoms with weather or environmental changes or other aggravating or alleviating factors except as outlined above   Current Medications, Allergies, Complete Past Medical History, Past Surgical History, Family History, and Social History were reviewed in  [employer] record.  ROS  The following are not active complaints unless bolded sore throat, dysphagia, dental problems, itching, sneezing,  nasal congestion or excess/ purulent secretions, ear ache,   fever, chills, sweats, unintended wt loss, pleuritic or exertional cp, hemoptysis,  orthopnea pnd or leg swelling, presyncope, palpitations, abdominal pain, anorexia, nausea, vomiting, diarrhea  or change in bowel or urinary habits, change in stools or urine, dysuria,hematuria,  rash, arthralgias, visual complaints, headache, numbness weakness or ataxia or problems with walking or coordination,  change in mood/affect or memory.          Objective:   Physical Exam  amb wm nad   Wt Readings from Last 3 Encounters:  11/11/14 157 lb 9.6 oz (71.487 kg)  07/16/14 162 lb (73.483 kg)  06/01/14 159 lb 6.4 oz (72.303 kg)    Vital signs reviewed   Gen: Pleasant, well-nourished, in no distress,  normal affect  ENT: No lesions,  mouth clear,  oropharynx clear, no postnasal drip  Neck: No JVD, no TMG, no carotid bruits  Lungs: No use of accessory muscles, clear without rales or rhonchi  Cardiovascular: RRR, heart sounds normal, no murmur or gallops, no peripheral edema  Musculoskeletal: No deformities, no cyanosis or clubbing  Neuro: alert, non focal  Skin: Warm, no lesions or rashes     I personally reviewed images and agree with radiology impression as follows:  CXR:  11/02/14  IMPRESSION: 1. 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 The extent of disease has significantly increased compared to prior study 08/25/2012.    Labs ordered   Lab Results  Component Value Date   ESRSEDRATE 47* 11/11/2014        Assessment & Plan:

## 2014-11-11 NOTE — Patient Instructions (Addendum)
Please remember to go to the lab   department downstairs for your tests - we will call you with the results when they are available.  Prednisone 10 mg take each am until better then 1 daily until return to see Dr Lamonte Sakai first available after 2 weeks

## 2014-11-14 ENCOUNTER — Encounter: Payer: Self-pay | Admitting: Internal Medicine

## 2014-11-14 NOTE — Assessment & Plan Note (Addendum)
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  I had an extended discussion with the patient reviewing all relevant studies completed to date and  lasting 15 to 20 minutes of a 25 minute visit    The goal with a chronic steroid dependent illness is always arriving at the lowest effective dose that controls the disease/symptoms and not accepting a set "formula" which is based on statistics or guidelines that don't always take into account patient  variability or the natural hx of the dz in every individual patient, which may well vary over time.  For now therefore I recommend the patient maintain  20 mg per day until better then one daily   Each maintenance medication was reviewed in detail including most importantly the difference between maintenance and prns and under what circumstances the prns are to be triggered using an action plan format that is not reflected in the computer generated alphabetically organized AVS.    Please see instructions for details which were reviewed in writing and the patient given a copy highlighting the part that I personally wrote and discussed at today's ov.

## 2014-12-22 ENCOUNTER — Ambulatory Visit: Payer: Medicare Other | Admitting: Emergency Medicine

## 2014-12-24 ENCOUNTER — Ambulatory Visit (INDEPENDENT_AMBULATORY_CARE_PROVIDER_SITE_OTHER): Payer: Medicare Other | Admitting: Emergency Medicine

## 2014-12-24 ENCOUNTER — Encounter: Payer: Self-pay | Admitting: Emergency Medicine

## 2014-12-24 VITALS — BP 134/88 | HR 69 | Ht 61.5 in | Wt 160.0 lb

## 2014-12-24 DIAGNOSIS — J8489 Other specified interstitial pulmonary diseases: Secondary | ICD-10-CM | POA: Diagnosis not present

## 2014-12-24 NOTE — Patient Instructions (Addendum)
Please stop your prednisone now. Call our office if your breathing worsens, or if any problems from stopping the medicine like weakness, fatigue, aches/pains.  We will perform full pulmonary function testing We will repeat your CT scan of the chest  Follow with Dr Lamonte Sakai in 2 months or sooner if you have any problems.

## 2014-12-24 NOTE — Assessment & Plan Note (Signed)
Recurrent scattered ground glass infiltrates on CT scan, dyspnea, cough, all consistent with COP. She has responded to prednisone and is currently being maintained on 10 mg daily. I like to try to wean this off now. I discussed with her the symptoms to look for that would be consistent with adrenal insufficiency. We will repeat her CT chest to look for resolution of her infiltrates. I will also perform pulmonary function testing to assess her physiology now that she is back at baseline.

## 2014-12-24 NOTE — Progress Notes (Signed)
Subjective:    Patient ID: Tiffany Kane, female    DOB: 06-09-1940, 74 y.o.   MRN: [id number] HPI 74 yo remote smoker (3 pk-yrs) with hx HTN, ? Breast CA (with negative post-op path after positive needle bx 2002), allergies and sinus disease. She is referred for eval by Dr Jonny Ruiz for cough and abnormal CXR/CT scan. She was hospitalized early Nov '13 for apparent CAP - experienced fatigue, cough productive of , loss of appetite, pain with cough. She is improved, still with a hacky cough, happens w exertion or with a lot of talking. She has a hx of CT scan chest from 09/2011 and 9/13 that show scattered multilobar small GG opacities, stable on serial exams. Prompted by abnormal CXR in 09/2011 (for cough and ? PNA). She is clinically better than hospital but not back to her baseline of mid-October. She has nasal gtt and sinus fullness. Rarely has GERD sx.    ROV 05/20/12 -- returns to discuss cough and her abnormal CT scan chest. We decided to repeat the scan to look for interval change >> largely stable peripheral GG areas. She feels better - her cough is gone. We started allergy regimen last time; she stopped taking fluticasone spray due to jitteriness.   ROV 06/13/12 -- follows up for cough and GGI on her CT scan chest, last was 05/20/12. She is s/p FOB on 1/13 >> TBBx all normal, AFB negative so far, bacterial > normal flora. She reports feeling well, no cough. No longer on an allergy regimen - may need to restart for the Spring.   ROV 07/16/12 -- follows for cough, abnormal CT scan chest, ? COP with negative bx's and cx's on FOB 05/26/12. Treated with pred 30mg  x 3 weeks. She had side effects >> had jitters, some anxiety, increased appetite. She has CT scan planned for April.   ROV 09/15/12 -- follows for cough, abnormal CT scan chest, ? COP with negative bx's and cx's. She is doing well, having no cough or other sx. A repeat Ct scan chest was done > shows large scale interval improvement in scatter GGI  infiltrates, but some residual disease persists.   ROV 04/24/13 -- follows for cough, abnormal CT scan chest, ? COP with negative bx's and cx's on FOB 05/26/12. Was treated with pred with large scale clearing on CT scan 08/25/12. Her cough is resolved, breathing stable, no limitations.   06/01/2014 ROV Chronic cough, abn CT ? COP w/ neg bx/cx on FOB  Patient returns for one-year follow-up Reports breathing/cough are doing well.  no cough presently. Last chest x-ray showed stable changes in year ago. 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 Cough improved with steroids. rec  Chest xray today  If you develop cough or shortness of breath please call for follow up > did not do    11/11/14  -- f/u ov/Wert re: acute cough/ abn ct chest Chief Complaint  Patient presents with  . BOOP    Breathing is doing well since her seeing MW. Does report that she does have some issues with SOB at times.  abrupt onset late May 2016  cough/ slt fever mucus sorta yellow / midline pain ant chest with coughing  > feel better zpak/ no better then ? levaquin  > some  better  From the original illness but now doe x room to room which is new -   ROV 12/24/14 -- follows for cough, abnormal CT scan chest, suspected COP with negative bx's  and cx's on FOB 05/26/12. Was treated with pred with large scale clearing on CT scan 08/25/12. She had done well until Spring / summer of this year, developed cough and dyspnea (new compared with our visits in 08/20/12). Repeat CT scan of the chest was performed on 11/02/14 I reviewed this personally. It showed new patchy areas of groundglass bilaterally more pronounced than in August 20, 2012 but in a similar distribution. She was treated with abx without any improvement. She saw Dr Melvyn Novas 6/30 > was started on pred 20mg  daily for ~ 2 weeks, then to 10mg  daily on 11/27/14. She states that her breathing has improved on the pred. She is starting an exercise program at the [facility]. Her cough has  resolved.        Objective:   Physical Exam Filed Vitals:   12/24/14 0858  BP: 134/88  Pulse: 69  Height: 5' 1.5" (1.562 m)  Weight: 160 lb (72.576 kg)  SpO2: 98%    Wt Readings from Last 3 Encounters:  12/24/14 160 lb (72.576 kg)  11/11/14 157 lb 9.6 oz (71.487 kg)  07/16/14 162 lb (73.483 kg)     Gen: Pleasant, well-nourished, in no distress,  normal affect  ENT: No lesions,  mouth clear,  oropharynx clear, no postnasal drip  Neck: No JVD, no TMG, no carotid bruits  Lungs: No use of accessory muscles, clear without rales or rhonchi  Cardiovascular: RRR, heart sounds normal, no murmur or gallops, no peripheral edema  Musculoskeletal: No deformities, no cyanosis or clubbing  Neuro: alert, non focal  Skin: Warm, no lesions or rashes   11/02/14 --  COMPARISON: Chest CT 08/25/2012. Chest x-ray 10/26/2014.  FINDINGS: Mediastinum/Lymph Nodes: Heart size is normal. There is no significant pericardial fluid, thickening or pericardial calcification. There is atherosclerosis of the thoracic aorta, the great vessels of the mediastinum and the coronary arteries, including calcified atherosclerotic plaque in the left main, left anterior descending and right coronary arteries. In addition, there is mild aneurysmal dilatation of the ascending thoracic aorta (4.5 cm in diameter). No pathologically enlarged mediastinal or hilar lymph nodes. Please note that accurate exclusion of hilar adenopathy is limited on noncontrast CT scans. Esophagus is unremarkable in appearance. No axillary lymphadenopathy. 2.1 x 1.6 cm partially calcified right thyroid nodule is nonspecific, but similar to prior study 08/25/2012, favored to be benign. Surgical clips on the left side at the level of the thoracic inlet.  Lungs/Pleura: There are patchy areas of ground-glass attenuation throughout the lungs bilaterally, which are similar in distribution to remote prior study 08/25/2012, but appear  significantly more pronounced, now associated with inter- and interlobular septal thickening, as well as profound thickening of the peribronchovascular distribution in the areas of involvement, with some associated traction bronchiectasis. This appears rather randomly distributed throughout the lungs bilaterally (left greater than right), without a clearly definable craniocaudal gradient. Several new areas of less pronounced ground-glass attenuation are also noted. No pleural effusions. 4 mm subpleural nodule along the right major fissure (image 36 of series 4) is unchanged, compatible with a benign subpleural lymph node. No other suspicious appearing pulmonary nodules or masses are noted.  Upper Abdomen: Extensive atherosclerosis.  Musculoskeletal/Soft Tissues: Chronic T11 compression fracture with 60% loss of anterior vertebral body height is unchanged. There are no aggressive appearing lytic or blastic lesions noted in the visualized portions of the skeleton.  IMPRESSION: 1. The appearance of the lungs is most suggestive of underlying interstitial lung disease, and the pattern (although nonspecific) is favored to  reflect cryptogenic organizing pneumonia (COP). The extent of disease has significantly increased compared to prior study 08/25/2012. 2. Atherosclerosis, including left main and 2 vessel coronary artery disease. Assessment for potential risk factor modification, dietary therapy or pharmacologic therapy may be warranted, if clinically indicated. 3. Mild aneurysmal dilatation of the ascending thoracic aorta (4.5 cm in diameter). Ascending thoracic aortic aneurysm. Recommend semi-annual imaging followup by CTA or MRA and referral to cardiothoracic surgery if not already obtained. This recommendation follows 2010 ACCF/AHA/AATS/ACR/ASA/SCA/SCAI/SIR/STS/SVM Guidelines for the Diagnosis and Management of Patients With Thoracic Aortic Disease. Circulation. 2010; 121:  Q222-L798. 4. Additional incidental findings, as above.  Labs ordered   Lab Results  Component Value Date   ESRSEDRATE 47* 11/11/2014        Assessment & Plan:   BOOP (bronchiolitis obliterans with organizing pneumonia) Recurrent scattered ground glass infiltrates on CT scan, dyspnea, cough, all consistent with COP. She has responded to prednisone and is currently being maintained on 10 mg daily. I like to try to wean this off now. I discussed with her the symptoms to look for that would be consistent with adrenal insufficiency. We will repeat her CT chest to look for resolution of her infiltrates. I will also perform pulmonary function testing to assess her physiology now that she is back at baseline.

## 2014-12-27 ENCOUNTER — Ambulatory Visit (INDEPENDENT_AMBULATORY_CARE_PROVIDER_SITE_OTHER)
Admission: RE | Admit: 2014-12-27 | Discharge: 2014-12-27 | Disposition: A | Discharge status: Home / Self Care | Payer: Medicare Other | Source: Ambulatory Visit | Attending: Emergency Medicine | Admitting: Emergency Medicine

## 2014-12-27 ENCOUNTER — Telehealth: Payer: Self-pay | Admitting: Emergency Medicine

## 2014-12-27 DIAGNOSIS — J8489 Other specified interstitial pulmonary diseases: Secondary | ICD-10-CM | POA: Diagnosis not present

## 2014-12-27 DIAGNOSIS — I712 Thoracic aortic aneurysm, without rupture: Secondary | ICD-10-CM | POA: Diagnosis not present

## 2014-12-27 NOTE — Telephone Encounter (Signed)
Per 12/24/14 OV: We will repeat your CT scan of the chest. ---  Called spoke with pt. Made aware of above. Advised this is for high resolution. Nothing further needed

## 2014-12-31 DIAGNOSIS — S238XXA Sprain of other specified parts of thorax, initial encounter: Secondary | ICD-10-CM | POA: Diagnosis not present

## 2015-01-04 ENCOUNTER — Telehealth: Payer: Self-pay | Admitting: Emergency Medicine

## 2015-01-04 DIAGNOSIS — J189 Pneumonia, unspecified organism: Secondary | ICD-10-CM

## 2015-01-04 NOTE — Telephone Encounter (Signed)
CT results unread  I spoke with pt and notified once RB reviews we will call her with results  She is aware that he is out of the office for the remainder of this wk  Please advise results, thanks

## 2015-01-05 NOTE — Telephone Encounter (Signed)
Message sent to Dr. Lamonte Sakai to review results. Will contact patient once Dr. Lamonte Sakai has reviewed results

## 2015-01-06 DIAGNOSIS — S22000A Wedge compression fracture of unspecified thoracic vertebra, initial encounter for closed fracture: Secondary | ICD-10-CM | POA: Diagnosis not present

## 2015-01-10 NOTE — Telephone Encounter (Signed)
Please let her know that her CT scan of the chest has not changed significantly compared with June 2016 - it shows stable ares of inflammation. I'd like to set up OV with her to discuss next steps for evaluation. Also please order a CBC with diff, and a blood eosinophil count. Thanks.

## 2015-01-10 NOTE — Telephone Encounter (Signed)
Patient calling to get results from CT CT results are in Epic To RB to review and result

## 2015-01-10 NOTE — Telephone Encounter (Signed)
Spoke with pt regarding results. Lab orders placed. She has appt scheduled in oct w/ PFT and CXR. Nothing further needed

## 2015-01-14 DIAGNOSIS — M546 Pain in thoracic spine: Secondary | ICD-10-CM | POA: Diagnosis not present

## 2015-01-14 DIAGNOSIS — M545 Low back pain: Secondary | ICD-10-CM | POA: Diagnosis not present

## 2015-01-24 DIAGNOSIS — S22000D Wedge compression fracture of unspecified thoracic vertebra, subsequent encounter for fracture with routine healing: Secondary | ICD-10-CM | POA: Diagnosis not present

## 2015-02-07 DIAGNOSIS — S22000D Wedge compression fracture of unspecified thoracic vertebra, subsequent encounter for fracture with routine healing: Secondary | ICD-10-CM | POA: Diagnosis not present

## 2015-02-11 ENCOUNTER — Other Ambulatory Visit: Payer: Self-pay | Admitting: Orthopedic Surgery

## 2015-02-11 DIAGNOSIS — M810 Age-related osteoporosis without current pathological fracture: Secondary | ICD-10-CM

## 2015-02-24 ENCOUNTER — Ambulatory Visit (INDEPENDENT_AMBULATORY_CARE_PROVIDER_SITE_OTHER): Payer: Medicare Other | Admitting: Emergency Medicine

## 2015-02-24 ENCOUNTER — Ambulatory Visit (INDEPENDENT_AMBULATORY_CARE_PROVIDER_SITE_OTHER)
Admission: RE | Admit: 2015-02-24 | Discharge: 2015-02-24 | Disposition: A | Discharge status: Home / Self Care | Payer: Medicare Other | Source: Ambulatory Visit | Attending: Emergency Medicine | Admitting: Emergency Medicine

## 2015-02-24 ENCOUNTER — Other Ambulatory Visit (INDEPENDENT_AMBULATORY_CARE_PROVIDER_SITE_OTHER): Payer: Medicare Other

## 2015-02-24 ENCOUNTER — Encounter: Payer: Self-pay | Admitting: Emergency Medicine

## 2015-02-24 VITALS — BP 122/78 | HR 85 | Ht 61.5 in | Wt 157.6 lb

## 2015-02-24 DIAGNOSIS — J189 Pneumonia, unspecified organism: Secondary | ICD-10-CM | POA: Diagnosis not present

## 2015-02-24 DIAGNOSIS — J8489 Other specified interstitial pulmonary diseases: Secondary | ICD-10-CM

## 2015-02-24 DIAGNOSIS — R05 Cough: Secondary | ICD-10-CM | POA: Diagnosis not present

## 2015-02-24 DIAGNOSIS — R053 Chronic cough: Secondary | ICD-10-CM

## 2015-02-24 DIAGNOSIS — J479 Bronchiectasis, uncomplicated: Secondary | ICD-10-CM | POA: Diagnosis not present

## 2015-02-24 LAB — CBC WITH DIFFERENTIAL/PLATELET
BASOS ABS: 0 10*3/uL (ref 0.0–0.1)
Basophils Relative: 0.3 % (ref 0.0–3.0)
EOS ABS: 0.3 10*3/uL (ref 0.0–0.7)
Eosinophils Relative: 4 % (ref 0.0–5.0)
HEMATOCRIT: 41.3 % (ref 36.0–46.0)
Hemoglobin: 14.1 g/dL (ref 12.0–15.0)
LYMPHS PCT: 26.9 % (ref 12.0–46.0)
Lymphs Abs: 1.9 10*3/uL (ref 0.7–4.0)
MCHC: 34.3 g/dL (ref 30.0–36.0)
MCV: 87.8 fl (ref 78.0–100.0)
MONOS PCT: 8.9 % (ref 3.0–12.0)
Monocytes Absolute: 0.6 10*3/uL (ref 0.1–1.0)
Neutro Abs: 4.3 10*3/uL (ref 1.4–7.7)
Neutrophils Relative %: 59.9 % (ref 43.0–77.0)
Platelets: 260 10*3/uL (ref 150.0–400.0)
RBC: 4.7 Mil/uL (ref 3.87–5.11)
RDW: 13.7 % (ref 11.5–15.5)
WBC: 7.2 10*3/uL (ref 4.0–10.5)

## 2015-02-24 LAB — PULMONARY FUNCTION TEST
DL/VA % pred: 102 %
DL/VA: 4.39 ml/min/mmHg/L
DLCO unc % pred: 91 %
DLCO unc: 17.64 ml/min/mmHg
FEF 25-75 Post: 2.69 L/sec
FEF 25-75 Pre: 2.4 L/sec
FEF2575-%Change-Post: 11 %
FEF2575-%Pred-Post: 196 %
FEF2575-%Pred-Pre: 175 %
FEV1-%CHANGE-POST: 2 %
FEV1-%PRED-POST: 124 %
FEV1-%PRED-PRE: 122 %
FEV1-PRE: 2.09 L
FEV1-Post: 2.14 L
FEV1FVC-%Change-Post: 1 %
FEV1FVC-%Pred-Pre: 110 %
FEV6-%Change-Post: 0 %
FEV6-%PRED-POST: 115 %
FEV6-%Pred-Pre: 115 %
FEV6-POST: 2.52 L
FEV6-PRE: 2.51 L
FEV6FVC-%PRED-PRE: 106 %
FEV6FVC-%Pred-Post: 106 %
FVC-%Change-Post: 0 %
FVC-%PRED-PRE: 108 %
FVC-%Pred-Post: 109 %
FVC-PRE: 2.51 L
FVC-Post: 2.52 L
POST FEV1/FVC RATIO: 85 %
PRE FEV6/FVC RATIO: 100 %
Post FEV6/FVC ratio: 100 %
Pre FEV1/FVC ratio: 83 %
RV % pred: 59 %
RV: 1.27 L
TLC % PRED: 86 %
TLC: 3.88 L

## 2015-02-24 LAB — EOSINOPHIL COUNT: Eosinophils Absolute: 0.3 10*3/uL (ref 0.0–0.7)

## 2015-02-24 NOTE — Patient Instructions (Addendum)
We will repeat a CXR today to compare with your prior scans Your PFT is normal.  Return to your regular exercise routine when you have recovered from your back injury.  Follow with Dr Lamonte Sakai in 12 months or sooner if you have any problems

## 2015-02-24 NOTE — Progress Notes (Signed)
PFT done today. 

## 2015-02-24 NOTE — Assessment & Plan Note (Signed)
She was able to stop prednisone one month ago without any return of dyspnea or cough. Her exam today is clear enter pulmonary function testing is normal.  I suspect that her COP has resolved. We will repeat a chest x-ray today to compare with her prior imaging. Given her significant improvement I do not believe we need to repeat her CT scan at this time. I will follow her in 1 year or sooner if needed

## 2015-02-24 NOTE — Progress Notes (Signed)
Subjective:    Patient ID: Tiffany Kane, female    DOB: 21-Sep-1940, 74 y.o.   MRN: [id number] HPI 74 yo remote smoker (3 pk-yrs) with hx HTN, ? Breast CA (with negative post-op path after positive needle bx 2002), allergies and sinus disease. She is referred for eval by Dr Jonny Ruiz for cough and abnormal CXR/CT scan. She was hospitalized early Nov '13 for apparent CAP - experienced fatigue, cough productive of , loss of appetite, pain with cough. She is improved, still with a hacky cough, happens w exertion or with a lot of talking. She has a hx of CT scan chest from 09/2011 and 9/13 that show scattered multilobar small GG opacities, stable on serial exams. Prompted by abnormal CXR in 09/2011 (for cough and ? PNA). She is clinically better than hospital but not back to her baseline of mid-October. She has nasal gtt and sinus fullness. Rarely has GERD sx.    ROV 05/20/12 -- returns to discuss cough and her abnormal CT scan chest. We decided to repeat the scan to look for interval change >> largely stable peripheral GG areas. She feels better - her cough is gone. We started allergy regimen last time; she stopped taking fluticasone spray due to jitteriness.   ROV 06/13/12 -- follows up for cough and GGI on her CT scan chest, last was 05/20/12. She is s/p FOB on 1/13 >> TBBx all normal, AFB negative so far, bacterial > normal flora. She reports feeling well, no cough. No longer on an allergy regimen - may need to restart for the Spring.   ROV 07/16/12 -- follows for cough, abnormal CT scan chest, ? COP with negative bx's and cx's on FOB 05/26/12. Treated with pred 30mg  x 3 weeks. She had side effects >> had jitters, some anxiety, increased appetite. She has CT scan planned for April.   ROV 09/15/12 -- follows for cough, abnormal CT scan chest, ? COP with negative bx's and cx's. She is doing well, having no cough or other sx. A repeat Ct scan chest was done > shows large scale interval improvement in scatter GGI  infiltrates, but some residual disease persists.   ROV 04/24/13 -- follows for cough, abnormal CT scan chest, ? COP with negative bx's and cx's on FOB 05/26/12. Was treated with pred with large scale clearing on CT scan 08/25/12. Her cough is resolved, breathing stable, no limitations.   06/01/2014 ROV Chronic cough, abn CT ? COP w/ neg bx/cx on FOB  Patient returns for one-year follow-up Reports breathing/cough are doing well.  no cough presently. Last chest x-ray showed stable changes in year ago. 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 Cough improved with steroids. rec  Chest xray today  If you develop cough or shortness of breath please call for follow up > did not do    11/11/14  -- f/u ov/Wert re: acute cough/ abn ct chest Chief Complaint  Patient presents with  . Follow-up    BOOP. Pt c/o slight dyspnea w/exertion. Pt denies cough/wheeze/CP/tightness.   abrupt onset late May 2016  cough/ slt fever mucus sorta yellow / midline pain ant chest with coughing  > feel better zpak/ no better then ? levaquin  > some  better  From the original illness but now doe x room to room which is new -   ROV 12/24/14 -- follows for cough, abnormal CT scan chest, suspected COP with negative bx's and cx's on FOB 05/26/12. Was treated with pred with  large scale clearing on CT scan 08/25/12. She had done well until Spring / summer of this year, developed cough and dyspnea (new compared with our visits in 2012-08-15). Repeat CT scan of the chest was performed on 11/02/14 I reviewed this personally. It showed new patchy areas of groundglass bilaterally more pronounced than in August 15, 2012 but in a similar distribution. She was treated with abx without any improvement. She saw Dr Melvyn Novas 6/30 > was started on pred 20mg  daily for ~ 2 weeks, then to 10mg  daily on 11/27/14. She states that her breathing has improved on the pred. She is starting an exercise program at the [facility]. Her cough has resolved.    ROV  02/24/15 -- follow-up visit for chronic cough and history of suspected COP first in 08-15-2012 and now more recently in the summer of 2016. At her last visit we stopped her prednisone.  She underwent full pulmonary function testing today and I have personally reviewed the results. These show normal airflows, a normal total lung capacity with a decreased RV consistent with possible mild restriction and a normal diffusion capacity. She tolerated stopping the pred (although had to take a medrol dose pack for a back injury in the interim, finished it mid September). Her breathing is good. Not coughing, no CP.      Objective:   Physical Exam Filed Vitals:   02/24/15 1622  BP: 122/78  Pulse: 85  Height: 5' 1.5" (1.562 m)  Weight: 157 lb 9.6 oz (71.487 kg)  SpO2: 96%    Wt Readings from Last 3 Encounters:  02/24/15 157 lb 9.6 oz (71.487 kg)  12/24/14 160 lb (72.576 kg)  11/11/14 157 lb 9.6 oz (71.487 kg)     Gen: Pleasant, well-nourished, in no distress,  normal affect  ENT: No lesions,  mouth clear,  oropharynx clear, no postnasal drip  Neck: No JVD, no TMG, no carotid bruits  Lungs: No use of accessory muscles, clear without rales or rhonchi  Cardiovascular: RRR, heart sounds normal, no murmur or gallops, no peripheral edema  Musculoskeletal: No deformities, no cyanosis or clubbing  Neuro: alert, non focal  Skin: Warm, no lesions or rashes   11/02/14 --  COMPARISON: Chest CT 08/25/2012. Chest x-ray 10/26/2014.  FINDINGS: Mediastinum/Lymph Nodes: Heart size is normal. There is no significant pericardial fluid, thickening or pericardial calcification. There is atherosclerosis of the thoracic aorta, the great vessels of the mediastinum and the coronary arteries, including calcified atherosclerotic plaque in the left main, left anterior descending and right coronary arteries. In addition, there is mild aneurysmal dilatation of the ascending thoracic aorta (4.5 cm in diameter). No  pathologically enlarged mediastinal or hilar lymph nodes. Please note that accurate exclusion of hilar adenopathy is limited on noncontrast CT scans. Esophagus is unremarkable in appearance. No axillary lymphadenopathy. 2.1 x 1.6 cm partially calcified right thyroid nodule is nonspecific, but similar to prior study 08/25/2012, favored to be benign. Surgical clips on the left side at the level of the thoracic inlet.  Lungs/Pleura: There are patchy areas of ground-glass attenuation throughout the lungs bilaterally, which are similar in distribution to remote prior study 08/25/2012, but appear significantly more pronounced, now associated with inter- and interlobular septal thickening, as well as profound thickening of the peribronchovascular distribution in the areas of involvement, with some associated traction bronchiectasis. This appears rather randomly distributed throughout the lungs bilaterally (left greater than right), without a clearly definable craniocaudal gradient. Several new areas of less pronounced ground-glass attenuation are also noted. No  pleural effusions. 4 mm subpleural nodule along the right major fissure (image 36 of series 4) is unchanged, compatible with a benign subpleural lymph node. No other suspicious appearing pulmonary nodules or masses are noted.  Upper Abdomen: Extensive atherosclerosis.  Musculoskeletal/Soft Tissues: Chronic T11 compression fracture with 60% loss of anterior vertebral body height is unchanged. There are no aggressive appearing lytic or blastic lesions noted in the visualized portions of the skeleton.  IMPRESSION: 1. 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 The extent of disease has significantly increased compared to prior study 08/25/2012. 2. Atherosclerosis, including left main and  2 vessel coronary artery disease. Assessment for potential risk factor modification, dietary therapy or pharmacologic therapy may be warranted, if clinically indicated. 3. Mild aneurysmal dilatation of the ascending thoracic aorta (4.5 cm in diameter). Ascending thoracic aortic aneurysm. Recommend semi-annual imaging followup by CTA or MRA and referral to cardiothoracic surgery if not already obtained. This recommendation follows 2010 ACCF/AHA/AATS/ACR/ASA/SCA/SCAI/SIR/STS/SVM Guidelines for the Diagnosis and Management of Patients With Thoracic Aortic Disease. Circulation. 2010; 121: [id number]. 4. Additional incidental findings, as above.  Labs ordered   Lab Results  Component Value Date   ESRSEDRATE 47* 11/11/2014        Assessment & Plan:   BOOP (bronchiolitis obliterans with organizing pneumonia) She was able to stop prednisone one month ago without any return of dyspnea or cough. Her exam today is clear enter pulmonary function testing is normal.  I suspect that her COP has resolved. We will repeat a chest x-ray today to compare with her prior imaging. Given her significant improvement I do not believe we need to repeat her CT scan at this time. I will follow her in 1 year or sooner if needed

## 2015-03-01 DIAGNOSIS — I10 Essential (primary) hypertension: Secondary | ICD-10-CM | POA: Diagnosis not present

## 2015-03-01 DIAGNOSIS — Z23 Encounter for immunization: Secondary | ICD-10-CM | POA: Diagnosis not present

## 2015-03-01 DIAGNOSIS — E782 Mixed hyperlipidemia: Secondary | ICD-10-CM | POA: Diagnosis not present

## 2015-03-01 DIAGNOSIS — Z0000 Encounter for general adult medical examination without abnormal findings: Secondary | ICD-10-CM | POA: Diagnosis not present

## 2015-03-16 DIAGNOSIS — S22000D Wedge compression fracture of unspecified thoracic vertebra, subsequent encounter for fracture with routine healing: Secondary | ICD-10-CM | POA: Diagnosis not present

## 2015-03-17 ENCOUNTER — Ambulatory Visit
Admission: RE | Admit: 2015-03-17 | Discharge: 2015-03-17 | Disposition: A | Discharge status: Home / Self Care | Payer: Medicare Other | Source: Ambulatory Visit | Attending: Orthopedic Surgery | Admitting: Orthopedic Surgery

## 2015-03-17 DIAGNOSIS — Z1231 Encounter for screening mammogram for malignant neoplasm of breast: Secondary | ICD-10-CM | POA: Diagnosis not present

## 2015-03-17 DIAGNOSIS — M810 Age-related osteoporosis without current pathological fracture: Secondary | ICD-10-CM

## 2015-03-17 DIAGNOSIS — M8589 Other specified disorders of bone density and structure, multiple sites: Secondary | ICD-10-CM | POA: Diagnosis not present

## 2015-04-18 DIAGNOSIS — G629 Polyneuropathy, unspecified: Secondary | ICD-10-CM | POA: Diagnosis not present

## 2015-04-18 DIAGNOSIS — M810 Age-related osteoporosis without current pathological fracture: Secondary | ICD-10-CM | POA: Diagnosis not present

## 2015-04-18 DIAGNOSIS — S22000D Wedge compression fracture of unspecified thoracic vertebra, subsequent encounter for fracture with routine healing: Secondary | ICD-10-CM | POA: Diagnosis not present

## 2015-05-02 DIAGNOSIS — M810 Age-related osteoporosis without current pathological fracture: Secondary | ICD-10-CM | POA: Diagnosis not present

## 2015-05-02 DIAGNOSIS — G629 Polyneuropathy, unspecified: Secondary | ICD-10-CM | POA: Diagnosis not present

## 2015-05-02 DIAGNOSIS — S22000D Wedge compression fracture of unspecified thoracic vertebra, subsequent encounter for fracture with routine healing: Secondary | ICD-10-CM | POA: Diagnosis not present

## 2015-05-10 DIAGNOSIS — S2232XA Fracture of one rib, left side, initial encounter for closed fracture: Secondary | ICD-10-CM | POA: Diagnosis not present

## 2015-07-15 ENCOUNTER — Telehealth: Payer: Self-pay | Admitting: Interventional Cardiology

## 2015-07-15 ENCOUNTER — Other Ambulatory Visit: Payer: Self-pay

## 2015-07-15 DIAGNOSIS — I712 Thoracic aortic aneurysm, without rupture, unspecified: Secondary | ICD-10-CM

## 2015-07-15 DIAGNOSIS — I10 Essential (primary) hypertension: Secondary | ICD-10-CM

## 2015-07-15 NOTE — Telephone Encounter (Addendum)
Tiffany Kane in check out states that she will call the pt and arrange date and time of MRA.

## 2015-07-15 NOTE — Telephone Encounter (Signed)
New message    Pt calling to speak to rn to schedule a MRA

## 2015-07-27 ENCOUNTER — Ambulatory Visit (HOSPITAL_COMMUNITY): Admission: RE | Admit: 2015-07-27 | Payer: Medicare Other | Source: Ambulatory Visit

## 2015-08-05 ENCOUNTER — Ambulatory Visit (HOSPITAL_COMMUNITY)
Admission: RE | Admit: 2015-08-05 | Discharge: 2015-08-05 | Disposition: A | Discharge status: Home / Self Care | Payer: Medicare Other | Source: Ambulatory Visit | Attending: Interventional Cardiology | Admitting: Interventional Cardiology

## 2015-08-05 DIAGNOSIS — I708 Atherosclerosis of other arteries: Secondary | ICD-10-CM | POA: Diagnosis not present

## 2015-08-05 DIAGNOSIS — I712 Thoracic aortic aneurysm, without rupture, unspecified: Secondary | ICD-10-CM

## 2015-08-05 LAB — CREATININE, SERUM
Creatinine, Ser: 0.82 mg/dL (ref 0.44–1.00)
GFR calc Af Amer: >60 mL/min (ref >60)

## 2015-08-05 MED ORDER — GADOBENATE DIMEGLUMINE 529 MG/ML IV SOLN
20.0000 mL | Freq: Once | INTRAVENOUS | Status: AC | PRN
  Administered 2015-08-05: 20 mL via INTRAVENOUS

## 2015-10-03 NOTE — Progress Notes (Signed)
Patient ID: Tiffany Kane, female   DOB: [birth year], 75 y.o.   MRN: [id number]     Cardiology Office Note   Date:  10/04/2015   ID:  Tiffany Kane, DOB 1940/09/12, MRN [id number]  PCP:  Jonathon Bellows, MD    No chief complaint on file. f/u thoracic aneurysm   Wt Readings from Last 3 Encounters:  10/04/15 159 lb (72.122 kg)  02/24/15 157 lb 9.6 oz (71.487 kg)  12/24/14 160 lb (72.576 kg)       History of Present Illness: Tiffany Kane is a 75 y.o. female   who has had a thoracic aneurysm. It was stable in size by MRA in 2011. Recent MRA in 3/17 showed a diameter of 4.4 cm of the thoracic aorta.She is no longer exercising every other day. No more treadmill use now.  She had some back problems. SHe has not had any chest pain recently. She denies SOB with walking or at rest.   She checks BP at home. It runs about 130-138/70-80s.She  Admits to being depressed occasionally. She is getting out of the house less. She denies any trouble with her left arm being weak. 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 She has not had a documented blood pressure difference between the 2 arms at any prior visit.   She has gained some weight since her last visit as well.   Past Medical History  Diagnosis Date  . Hypertension   . Hyperlipemia   . Depression   . Anxiety   . High cholesterol   . Coronary artery disease   . Esophageal reflux   . Arthritis   . Pneumonia   . Thyromegaly   . Compression fracture     T-11  . Thoracic aneurysm     DR. Leonia Reeves  . Bronchitis   . Hyperlipidemia   . Depression     situational stress  . Breast cancer ([place]) 2002    DUCTAL IN SITU.Marland Kitchen1999  . Osteopenia   . Osteoporosis 03/2006  . H/O ascites 09/2004    Past Surgical History  Procedure Laterality Date  . Cervical spine surgery    . Abdominal hysterectomy    . Breast biopsy    . Video bronchoscopy  05/26/2012    Procedure: VIDEO BRONCHOSCOPY WITH FLUORO;   Surgeon: Collene Gobble, MD;  Location: [facility];  Service: Cardiopulmonary;  Laterality: Bilateral;  . Cardiac catheterization  06/1999     Current Outpatient Prescriptions  Medication Sig Dispense Refill  . ALPRAZolam (XANAX) 0.5 MG tablet Take 0.5 mg by mouth daily as needed. For anxiety    . aspirin EC 81 MG tablet Take 81 mg by mouth daily.    . metoprolol succinate (TOPROL-XL) 50 MG 24 hr tablet Take 1 tablet (50 mg total) by mouth 2 (two) times daily. Take with or immediately following a meal. 180 tablet 3  . simvastatin (ZOCOR) 80 MG tablet Take 40 mg by mouth at bedtime.    . triamterene-hydrochlorothiazide (MAXZIDE-25) 37.5-25 MG per tablet Take 1 tablet by mouth daily.     No current facility-administered medications for this visit.    Allergies:   Demerol; Lexapro; Prednisone; and Zoloft    Social History:  The patient  reports that she quit smoking about 23 years ago. Her smoking use included Cigarettes. She has a 3 pack-year smoking history. She has never used smokeless tobacco. She reports that she drinks alcohol. She reports that she  does not use illicit drugs.   Family History:  The patient's family history includes Bone cancer in her father; Breast cancer in her mother; Diabetes in her mother and sister; Heart attack in her brother; Heart disease in her brother; Hypertension in her mother and sister; Prostate cancer in her brother and father.    ROS:  Please see the history of present illness.   Otherwise, review of systems are positive for less activity.   All other systems are reviewed and negative.    PHYSICAL EXAM: VS:  BP 120/80 mmHg  Pulse 72  Ht 5' 1.5" (1.562 m)  Wt 159 lb (72.122 kg)  BMI 29.56 kg/m2 , BMI Body mass index is 29.56 kg/(m^2). GEN: Well nourished, well developed, in no acute distress HEENT: normal Neck: no JVD, carotid bruits, or masses Cardiac: RRR; no murmurs, rubs, or gallops,no edema  Respiratory:  clear to auscultation bilaterally,  normal work of breathing GI: soft, nontender, nondistended, + BS MS: no deformity or atrophy Skin: warm and dry, no rash Neuro:  Strength and sensation are intact Psych: euthymic mood, full affect   EKG:   The ekg ordered today demonstrates NSR, no ST segment changes   Recent Labs: 02/24/2015: Hemoglobin 14.1; Platelets 260.0 08/05/2015: Creatinine, Ser 0.82   Lipid Panel No results found for: CHOL, TRIG, HDL, CHOLHDL, VLDL, LDLCALC, LDLDIRECT   Other studies Reviewed: Additional studies/ records that were reviewed today with results demonstrating: .   ASSESSMENT AND PLAN:  1. Thoracic aneurysm:  Stable by MRA In 1/16 to 3/17. Slow rate of growth. Will plan on another imaging study in one year. She gets anxious if we wait longer.  2. HTN: Controlled at home. Continue beta blocker. Controlled today in both arms.  Right arm as above.  Left arm 128/70.  ? Left subclavian stenosis on the MRA noted.  No diffierence in BP.  3. High cholesterol: Lipids well controlled at most recent check. LDL 100.Followed by Dr. Justin Mend. 4. CAD: Nonobstructive by cath in 2001. Negative stress test in 2010.   Current medicines are reviewed at length with the patient today.  The patient concerns regarding her medicines were addressed.  The following changes have been made:  No change  Labs/ tests ordered today include:   Orders Placed This Encounter  Procedures  . EKG 12-Lead    Recommend 150 minutes/week of aerobic exercise Low fat, low carb, high fiber diet recommended  Disposition:   FU in 1 year   Signed, Larae Grooms, MD  10/04/2015 10:12 AM    [address], [place], [place]  [address] Phone: [phone]; Fax: [phone]

## 2015-10-04 ENCOUNTER — Encounter: Payer: Self-pay | Admitting: Interventional Cardiology

## 2015-10-04 ENCOUNTER — Ambulatory Visit (INDEPENDENT_AMBULATORY_CARE_PROVIDER_SITE_OTHER): Payer: Medicare Other | Admitting: Interventional Cardiology

## 2015-10-04 VITALS — BP 120/80 | HR 72 | Ht 61.5 in | Wt 159.0 lb

## 2015-10-04 DIAGNOSIS — I712 Thoracic aortic aneurysm, without rupture, unspecified: Secondary | ICD-10-CM

## 2015-10-04 DIAGNOSIS — I10 Essential (primary) hypertension: Secondary | ICD-10-CM | POA: Diagnosis not present

## 2015-10-04 DIAGNOSIS — E782 Mixed hyperlipidemia: Secondary | ICD-10-CM

## 2015-10-04 NOTE — Patient Instructions (Signed)
Medication Instructions:  Same-no changes  Labwork: None  Testing/Procedures: MRA of the chest in March or April 2018  Follow-Up: Your physician wants you to follow-up in: 1 year. You will receive a reminder letter in the mail two months in advance. If you don't receive a letter, please call our office to schedule the follow-up appointment.   If you need a refill on your cardiac medications before your next appointment, please call your pharmacy.

## 2015-10-06 DIAGNOSIS — H5203 Hypermetropia, bilateral: Secondary | ICD-10-CM | POA: Diagnosis not present

## 2015-10-06 DIAGNOSIS — H43812 Vitreous degeneration, left eye: Secondary | ICD-10-CM | POA: Diagnosis not present

## 2015-10-06 DIAGNOSIS — H25811 Combined forms of age-related cataract, right eye: Secondary | ICD-10-CM | POA: Diagnosis not present

## 2015-10-06 DIAGNOSIS — Z961 Presence of intraocular lens: Secondary | ICD-10-CM | POA: Diagnosis not present

## 2015-11-21 ENCOUNTER — Other Ambulatory Visit: Payer: Self-pay

## 2015-11-21 MED ORDER — METOPROLOL SUCCINATE ER 50 MG PO TB24: 50.0000 mg | ORAL_TABLET | Freq: Two times a day (BID) | ORAL | Status: DC

## 2015-11-21 NOTE — Telephone Encounter (Signed)
Jettie Booze, MD at 10/03/2015 9:02 PM  metoprolol succinate (TOPROL-XL) 50 MG 24 hr tabletTake 1 tablet (50 mg total) by mouth 2 (two) times daily. Take with or immediately following a meal Current medicines are reviewed at length with the patient today. The patient concerns regarding her medicines were addressed.  The following changes have been made: No change  Patient Instructions     Medication Instructions:  Same-no changes

## 2015-12-29 DIAGNOSIS — T1490 Injury, unspecified: Secondary | ICD-10-CM | POA: Diagnosis not present

## 2015-12-29 DIAGNOSIS — W5501XA Bitten by cat, initial encounter: Secondary | ICD-10-CM | POA: Diagnosis not present

## 2016-01-09 ENCOUNTER — Telehealth: Payer: Self-pay | Admitting: Emergency Medicine

## 2016-01-09 NOTE — Telephone Encounter (Signed)
Appointment Request From: Peter Congo. Monia Pouch    With Provider: Collene Gobble., MD [facility]]    Preferred Date Range: Any date 01/07/2016 or later    Preferred Times: Any    Reason: To address the following health maintenance concerns.  Pna Vac Low Risk Adult  Influenza Vaccine    Comments:  Received msg. my Tetanus, Shingles, Pneumonia, and Flu shots are overdue. I have recently, within the last two weeks, had a Tetanus. I am not available on Wednesday mornings for an appt. Also, not available at this time on Sept. 7th and Sept. 22nd. I prefer morning appts. if possible in Sept.  I have question regarding Shingles Vaccine, as to how often I should have this. I feel sure my primary physician, Dr. Justin Mend. gave me this, but not sure when. Thank you.

## 2016-01-09 NOTE — Telephone Encounter (Signed)
Spoke with pt. She has been added to RB's schedule on 02/24/16 at 10:45am. Nothing further was needed.

## 2016-02-21 DIAGNOSIS — R05 Cough: Secondary | ICD-10-CM | POA: Diagnosis not present

## 2016-02-21 DIAGNOSIS — R6889 Other general symptoms and signs: Secondary | ICD-10-CM | POA: Diagnosis not present

## 2016-02-24 ENCOUNTER — Encounter: Payer: Self-pay | Admitting: Emergency Medicine

## 2016-02-24 ENCOUNTER — Ambulatory Visit (INDEPENDENT_AMBULATORY_CARE_PROVIDER_SITE_OTHER): Payer: Medicare Other | Admitting: Emergency Medicine

## 2016-02-24 DIAGNOSIS — Z23 Encounter for immunization: Secondary | ICD-10-CM | POA: Diagnosis not present

## 2016-02-24 DIAGNOSIS — J8489 Other specified interstitial pulmonary diseases: Secondary | ICD-10-CM | POA: Diagnosis not present

## 2016-02-24 NOTE — Assessment & Plan Note (Signed)
No evidence of recurrent Cryptogenic Organizing Pneumonia (COP) at this time Flu shot today Follow with Dr Lamonte Sakai in 1 year with a CXR Please call our office if you develop persistent symptoms of cough, shortness of breath, fatigue. If so we will evaluate with CXR or CT scan sooner

## 2016-02-24 NOTE — Patient Instructions (Signed)
No evidence of recurrent Cryptogenic Organizing Pneumonia (COP) at this time Flu shot today Follow with Dr Lamonte Sakai in 1 year with a CXR Please call our office if you develop persistent symptoms of cough, shortness of breath, fatigue. If so we will evaluate with CXR or CT scan sooner

## 2016-02-24 NOTE — Progress Notes (Signed)
Subjective:    Patient ID: Tiffany Kane, female    DOB: 27-May-1940, 75 y.o.   MRN: [id number] HPI Original HX: 75 yo remote smoker (3 pk-yrs) with hx HTN, ? Breast CA (with negative post-op path after positive needle bx 03-Sep-2000), allergies and sinus disease. She is referred for eval by Dr Jonny Ruiz for cough and abnormal CXR/CT scan. She was hospitalized early Nov '13 for apparent CAP - experienced fatigue, cough productive of , loss of appetite, pain with cough. She is improved, still with a hacky cough, happens w exertion or with a lot of talking. She has a hx of CT scan chest from 09/2011 and 9/13 that show scattered multilobar small GG opacities, stable on serial exams. Prompted by abnormal CXR in 09/2011 (for cough and ? PNA). She is clinically better than hospital but not back to her baseline of mid-October. She has nasal gtt and sinus fullness. Rarely has GERD sx.     ROV 12/24/14 -- follows for cough, abnormal CT scan chest, suspected COP with negative bx's and cx's on FOB 05/26/12. Was treated with pred with large scale clearing on CT scan 08/25/12. She had done well until Spring / summer of this year, developed cough and dyspnea (new compared with our visits in 03-Sep-2012). Repeat CT scan of the chest was performed on 11/02/14 I reviewed this personally. It showed new patchy areas of groundglass bilaterally more pronounced than in September 03, 2012 but in a similar distribution. She was treated with abx without any improvement. She saw Dr Melvyn Novas 6/30 > was started on pred 20mg  daily for ~ 2 weeks, then to 10mg  daily on 11/27/14. She states that her breathing has improved on the pred. She is starting an exercise program at the [facility]. Her cough has resolved.    ROV 02/24/15 -- follow-up visit for chronic cough and history of suspected COP first in 09-03-2012 and now more recently in the summer of 2016. At her last visit we stopped her prednisone.  She underwent full pulmonary function testing today and I have personally reviewed  the results. These show normal airflows, a normal total lung capacity with a decreased RV consistent with possible mild restriction and a normal diffusion capacity. She tolerated stopping the pred (although had to take a medrol dose pack for a back injury in the interim, finished it mid September). Her breathing is good. Not coughing, no CP.   ROV 02/24/16 -- patient has a history of suspected COP that has been originally diagnosed in 2012/09/03 and then more recently seen in the summer of 2016. Her Pulmonary function testing following that episode showed some restriction without clear obstruction.  She caught a URI about 2 weeks ago, saw her PCP, flu test negative. She took azithro. Has improved now, sinus drainage is improving but not quite gone. No CXR done. She feels that she is getting better.       Objective:   Physical Exam Vitals:   02/24/16 1037  BP: 120/82  Pulse: 67  SpO2: 97%  Weight: 158 lb (71.7 kg)  Height: 5\' 1"  (1.549 m)    Wt Readings from Last 3 Encounters:  02/24/16 158 lb (71.7 kg)  10/04/15 159 lb (72.1 kg)  02/24/15 157 lb 9.6 oz (71.5 kg)     Gen: Pleasant, well-nourished, in no distress,  normal affect  ENT: No lesions,  mouth clear,  oropharynx clear, no postnasal drip  Neck: No JVD, no TMG, no carotid bruits  Lungs: No use of accessory muscles,  clear without rales or rhonchi  Cardiovascular: RRR, heart sounds normal, no murmur or gallops, no peripheral edema  Musculoskeletal: No deformities, no cyanosis or clubbing  Neuro: alert, non focal  Skin: Warm, no lesions or rashes   11/02/14 --  COMPARISON: Chest CT 08/25/2012. Chest x-ray 10/26/2014.  FINDINGS: Mediastinum/Lymph Nodes: Heart size is normal. There is no significant pericardial fluid, thickening or pericardial calcification. There is atherosclerosis of the thoracic aorta, the great vessels of the mediastinum and the coronary arteries, including calcified atherosclerotic plaque in the left  main, left anterior descending and right coronary arteries. In addition, there is mild aneurysmal dilatation of the ascending thoracic aorta (4.5 cm in diameter). No pathologically enlarged mediastinal or hilar lymph nodes. Please note that accurate exclusion of hilar adenopathy is limited on noncontrast CT scans. Esophagus is unremarkable in appearance. No axillary lymphadenopathy. 2.1 x 1.6 cm partially calcified right thyroid nodule is nonspecific, but similar to prior study 08/25/2012, favored to be benign. Surgical clips on the left side at the level of the thoracic inlet.  Lungs/Pleura: There are patchy areas of ground-glass attenuation throughout the lungs bilaterally, which are similar in distribution to remote prior study 08/25/2012, but appear significantly more pronounced, now associated with inter- and interlobular septal thickening, as well as profound thickening of the peribronchovascular distribution in the areas of involvement, with some associated traction bronchiectasis. This appears rather randomly distributed throughout the lungs bilaterally (left greater than right), without a clearly definable craniocaudal gradient. Several new areas of less pronounced ground-glass attenuation are also noted. No pleural effusions. 4 mm subpleural nodule along the right major fissure (image 36 of series 4) is unchanged, compatible with a benign subpleural lymph node. No other suspicious appearing pulmonary nodules or masses are noted.  Upper Abdomen: Extensive atherosclerosis.  Musculoskeletal/Soft Tissues: Chronic T11 compression fracture with 60% loss of anterior vertebral body height is unchanged. There are no aggressive appearing lytic or blastic lesions noted in the visualized portions of the skeleton.  IMPRESSION: 1. The appearance of the lungs is most suggestive of underlying interstitial lung disease, and the pattern (although nonspecific) is favored to reflect  cryptogenic organizing pneumonia (COP). The extent of disease has significantly increased compared to prior study 08/25/2012. 2. Atherosclerosis, including left main and 2 vessel coronary artery disease. Assessment for potential risk factor modification, dietary therapy or pharmacologic therapy may be warranted, if clinically indicated. 3. Mild aneurysmal dilatation of the ascending thoracic aorta (4.5 cm in diameter). Ascending thoracic aortic aneurysm. Recommend semi-annual imaging followup by CTA or MRA and referral to cardiothoracic surgery if not already obtained. This recommendation follows 2010 ACCF/AHA/AATS/ACR/ASA/SCA/SCAI/SIR/STS/SVM Guidelines for the Diagnosis and Management of Patients With Thoracic Aortic Disease. Circulation. 2010; 121: [id number]. 4. Additional incidental findings, as above.  Labs ordered   Lab Results  Component Value Date   ESRSEDRATE 47* 11/11/2014        Assessment & Plan:   BOOP (bronchiolitis obliterans with organizing pneumonia) No evidence of recurrent Cryptogenic Organizing Pneumonia (COP) at this time Flu shot today Follow with Dr Lamonte Sakai in 1 year with a CXR Please call our office if you develop persistent symptoms of cough, shortness of breath, fatigue. If so we will evaluate with CXR or CT scan sooner   Baltazar Apo, MD, PhD 02/24/2016, 11:05 AM [facility] [phone] or if no answer [phone]

## 2016-03-19 DIAGNOSIS — Z1231 Encounter for screening mammogram for malignant neoplasm of breast: Secondary | ICD-10-CM | POA: Diagnosis not present

## 2016-05-22 DIAGNOSIS — M79642 Pain in left hand: Secondary | ICD-10-CM | POA: Diagnosis not present

## 2016-05-22 DIAGNOSIS — M79641 Pain in right hand: Secondary | ICD-10-CM | POA: Diagnosis not present

## 2016-06-05 DIAGNOSIS — G5603 Carpal tunnel syndrome, bilateral upper limbs: Secondary | ICD-10-CM | POA: Diagnosis not present

## 2016-06-12 DIAGNOSIS — G5602 Carpal tunnel syndrome, left upper limb: Secondary | ICD-10-CM | POA: Diagnosis not present

## 2016-06-12 DIAGNOSIS — G5603 Carpal tunnel syndrome, bilateral upper limbs: Secondary | ICD-10-CM | POA: Diagnosis not present

## 2016-06-20 DIAGNOSIS — G5602 Carpal tunnel syndrome, left upper limb: Secondary | ICD-10-CM | POA: Diagnosis not present

## 2016-07-16 DIAGNOSIS — M1990 Unspecified osteoarthritis, unspecified site: Secondary | ICD-10-CM | POA: Diagnosis not present

## 2016-08-02 DIAGNOSIS — G5603 Carpal tunnel syndrome, bilateral upper limbs: Secondary | ICD-10-CM | POA: Diagnosis not present

## 2016-08-06 ENCOUNTER — Ambulatory Visit (HOSPITAL_COMMUNITY): Admission: RE | Admit: 2016-08-06 | Payer: Medicare Other | Source: Ambulatory Visit

## 2016-08-09 ENCOUNTER — Ambulatory Visit (HOSPITAL_COMMUNITY)
Admission: RE | Admit: 2016-08-09 | Discharge: 2016-08-09 | Disposition: A | Discharge status: Home / Self Care | Payer: Medicare Other | Source: Ambulatory Visit | Attending: Interventional Cardiology | Admitting: Interventional Cardiology

## 2016-08-09 DIAGNOSIS — I712 Thoracic aortic aneurysm, without rupture, unspecified: Secondary | ICD-10-CM

## 2016-08-09 DIAGNOSIS — M25649 Stiffness of unspecified hand, not elsewhere classified: Secondary | ICD-10-CM | POA: Diagnosis not present

## 2016-08-09 DIAGNOSIS — R918 Other nonspecific abnormal finding of lung field: Secondary | ICD-10-CM | POA: Diagnosis not present

## 2016-08-09 DIAGNOSIS — M25631 Stiffness of right wrist, not elsewhere classified: Secondary | ICD-10-CM | POA: Diagnosis not present

## 2016-08-09 LAB — CREATININE, SERUM
CREATININE: 0.7 mg/dL (ref 0.44–1.00)
GFR calc Af Amer: >60 mL/min (ref >60)

## 2016-08-09 MED ORDER — GADOBENATE DIMEGLUMINE 529 MG/ML IV SOLN
20.0000 mL | Freq: Once | INTRAVENOUS | Status: AC
  Administered 2016-08-09: 15 mL via INTRAVENOUS

## 2016-08-14 ENCOUNTER — Other Ambulatory Visit: Payer: Self-pay | Admitting: Interventional Cardiology

## 2016-08-17 DIAGNOSIS — M25631 Stiffness of right wrist, not elsewhere classified: Secondary | ICD-10-CM | POA: Diagnosis not present

## 2016-08-17 DIAGNOSIS — M25649 Stiffness of unspecified hand, not elsewhere classified: Secondary | ICD-10-CM | POA: Diagnosis not present

## 2016-08-29 DIAGNOSIS — M1990 Unspecified osteoarthritis, unspecified site: Secondary | ICD-10-CM | POA: Diagnosis not present

## 2016-09-17 ENCOUNTER — Encounter: Payer: Self-pay | Admitting: *Deleted

## 2016-09-28 DIAGNOSIS — M7989 Other specified soft tissue disorders: Secondary | ICD-10-CM | POA: Diagnosis not present

## 2016-09-28 DIAGNOSIS — R5383 Other fatigue: Secondary | ICD-10-CM | POA: Diagnosis not present

## 2016-09-28 DIAGNOSIS — E663 Overweight: Secondary | ICD-10-CM | POA: Diagnosis not present

## 2016-09-28 DIAGNOSIS — D0512 Intraductal carcinoma in situ of left breast: Secondary | ICD-10-CM | POA: Diagnosis not present

## 2016-09-28 DIAGNOSIS — Z6829 Body mass index (BMI) 29.0-29.9, adult: Secondary | ICD-10-CM | POA: Diagnosis not present

## 2016-09-28 DIAGNOSIS — M810 Age-related osteoporosis without current pathological fracture: Secondary | ICD-10-CM | POA: Diagnosis not present

## 2016-09-28 DIAGNOSIS — M2550 Pain in unspecified joint: Secondary | ICD-10-CM | POA: Diagnosis not present

## 2016-10-03 ENCOUNTER — Encounter: Payer: Self-pay | Admitting: Interventional Cardiology

## 2016-10-03 ENCOUNTER — Ambulatory Visit (INDEPENDENT_AMBULATORY_CARE_PROVIDER_SITE_OTHER): Payer: Medicare Other | Admitting: Interventional Cardiology

## 2016-10-03 VITALS — BP 136/76 | HR 55 | Ht 61.0 in | Wt 154.0 lb

## 2016-10-03 DIAGNOSIS — I712 Thoracic aortic aneurysm, without rupture, unspecified: Secondary | ICD-10-CM

## 2016-10-03 DIAGNOSIS — E782 Mixed hyperlipidemia: Secondary | ICD-10-CM | POA: Diagnosis not present

## 2016-10-03 DIAGNOSIS — I10 Essential (primary) hypertension: Secondary | ICD-10-CM

## 2016-10-03 NOTE — Patient Instructions (Signed)
Medication Instructions:  Your physician recommends that you continue on your current medications as directed. Please refer to the Current Medication list given to you today.    Labwork: None ordered.  Testing/Procedures: You are to be scheduled for a M[facility] of the chest at [facility] for May 2019.   Follow-Up: Your physician wants you to follow-up in: 1 year with Dr. Irish Lack.  You will receive a reminder letter in the mail two months in advance. If you don't receive a letter, please call our office to schedule the follow-up appointment.    Any Other Special Instructions Will Be Listed Below (If Applicable).     If you need a refill on your cardiac medications before your next appointment, please call your pharmacy.

## 2016-10-03 NOTE — Addendum Note (Signed)
Addended by: Laurel Dimmer on: 10/03/2016 02:29 PM   Modules accepted: Orders

## 2016-10-03 NOTE — Progress Notes (Signed)
Patient ID: Tiffany Kane, female   DOB: [birth year], 76 y.o.   MRN: [id number]     Cardiology Office Note   Date:  10/03/2016   ID:  Tiffany Kane, Tiffany Kane 12/20/1940, MRN [id number]  PCP:  Maurice Small, MD    No chief complaint on file. f/u thoracic aneurysm   Wt Readings from Last 3 Encounters:  10/03/16 154 lb (69.9 kg)  02/24/16 158 lb (71.7 kg)  10/04/15 159 lb (72.1 kg)       History of Present Illness: Tiffany Kane is a 76 y.o. female   who has had a thoracic aneurysm. It was stable in size by MRA in 2011. Recent MRA in 3/17 showed a diameter of 4.4 cm of the thoracic aorta.  She is no longer exercising at the [facility].   Limited by back pain.   She checks BP at home. It runs about 130-138/70-80s.Both arms have been the same.  THere has been no significnat difference between the two arms.  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 She has not had a documented blood pressure difference between the 2 arms at any prior cardiology visit.  No weakness in her arms.  No problems holding items.  No difference in home BP readings between arms.   She has lost some weight since her last visit as well.  She is eating less and walk more.    Denies : Chest pain. Dizziness. Leg edema. Nitroglycerin use. Orthopnea. Palpitations. Paroxysmal nocturnal dyspnea. Shortness of breath. Syncope.   Does report some hand joint swelling.  Seeing rheumatology.  She was started on antiinflammatories.   Past Medical History:  Diagnosis Date  . Anxiety   . Arthritis   . Breast cancer ([place]) 2002   DUCTAL IN SITU.Marland Kitchen1999  . Bronchitis   . Compression fracture    T-11  . Coronary artery disease   . Depression   . Depression    situational stress  . Esophageal reflux   . H/O ascites 09/2004  . High cholesterol   . Hyperlipemia   . Hyperlipidemia   . Hypertension   . Osteopenia   . Osteoporosis 03/2006  . Pneumonia   . Thoracic aneurysm    DR.  Leonia Reeves  . Thyromegaly     Past Surgical History:  Procedure Laterality Date  . ABDOMINAL HYSTERECTOMY    . BREAST BIOPSY    . CARDIAC CATHETERIZATION  06/1999  . CERVICAL SPINE SURGERY    . VIDEO BRONCHOSCOPY  05/26/2012   Procedure: VIDEO BRONCHOSCOPY WITH FLUORO;  Surgeon: Collene Gobble, MD;  Location: [facility];  Service: Cardiopulmonary;  Laterality: Bilateral;     Current Outpatient Prescriptions  Medication Sig Dispense Refill  . ALPRAZolam (XANAX) 0.5 MG tablet Take 0.5 mg by mouth daily as needed. For anxiety    . aspirin EC 81 MG tablet Take 81 mg by mouth daily.    . meloxicam (MOBIC) 15 MG tablet Take 7.5 mg by mouth 2 (two) times daily.  1  . metoprolol succinate (TOPROL-XL) 50 MG 24 hr tablet TAKE 1 TABLET BY MOUTH TWICE DAILY WITH OR IMMEDIATELY FOLLOWING A MEAL 60 tablet 1  . simvastatin (ZOCOR) 80 MG tablet Take 40 mg by mouth at bedtime.    . triamterene-hydrochlorothiazide (MAXZIDE-25) 37.5-25 MG per tablet Take 1 tablet by mouth daily.     No current facility-administered medications for this visit.     Allergies:   Demerol [meperidine]; Lexapro [  escitalopram oxalate]; Prednisone; and Zoloft [sertraline hcl]    Social History:  The patient  reports that she quit smoking about 24 years ago. Her smoking use included Cigarettes. She has a 3.00 pack-year smoking history. She has never used smokeless tobacco. She reports that she drinks alcohol. She reports that she does not use drugs.   Family History:  The patient's family history includes Bone cancer in her father; Breast cancer in her mother; Diabetes in her mother and sister; Heart attack in her brother; Heart disease in her brother; Hypertension in her mother and sister; Prostate cancer in her brother and father.    ROS:  Please see the history of present illness.   Otherwise, review of systems are positive for hand joint swelling.   All other systems are reviewed and negative.    PHYSICAL EXAM: VS:  BP  136/76 (BP Location: Left Arm, Patient Position: Sitting, Cuff Size: Normal)   Pulse (!) 55   Ht 5\' 1"  (1.549 m)   Wt 154 lb (69.9 kg)   SpO2 96%   BMI 29.10 kg/m  , BMI Body mass index is 29.1 kg/m. GEN: Well nourished, well developed, in no acute distress  HEENT: normal  Neck: no JVD, carotid bruits, or masses Cardiac: RRR; no murmurs, rubs, or gallops,no edema ; 2+ radial pulses bilaterally Respiratory:  clear to auscultation bilaterally, normal work of breathing GI: soft, nontender, nondistended,  MS: right MCP joint on her hand are swollen Skin: warm and dry, no rash Neuro:  Strength and sensation are intact Psych: euthymic mood, full affect   EKG:   The ekg ordered today demonstrates NSR, low voltage, no ST changes   Recent Labs: 08/09/2016: Creatinine, Ser 0.70   Lipid Panel No results found for: CHOL, TRIG, HDL, CHOLHDL, VLDL, LDLCALC, LDLDIRECT   Other studies Reviewed: Additional studies/ records that were reviewed today with results demonstrating:3/18: 4.2 cm Thoracic aneurysm .    ASSESSMENT AND PLAN:  1. Thoracic aneurysm:  Stable size of aneurysm by MRA In 1/16 to 3/17 to 3/18. Slow rate of growth. Will plan on another imaging study in one year. She gets anxious if we wait longer. No sign on exam of increased size.  No aortic valve issues noted by exam. 2. HTN: Controlled at home. Continue beta blocker. Controlled today 10/03/16 in both arms- 4 mm difference.  Right arm as above.  Left arm 128/70.  ? Left subclavian stenosis on the prior MRA noted.  No diffierence in BP between arms. No clinical signs of subclavian steal.  3. High cholesterol: Lipids well controlled at most recent check. LDL 100.Followed by Dr. Justin Mend. 4. CAD: Nonobstructive by cath in 2001. Negative stress test in 2010.No chest pain at this time.  No testing pllanned for eval of CAD.    Current medicines are reviewed at length with the patient today.  The patient concerns regarding her  medicines were addressed.  The following changes have been made:  No change  Labs/ tests ordered today include:   No orders of the defined types were placed in this encounter.   Recommend 150 minutes/week of aerobic exercise Low fat, low carb, high fiber diet recommended  Disposition:   FU in 1 year   Signed, Larae Grooms, MD  10/03/2016 1:51 PM    [facility] [address], [place], [place]  [address] Phone: [phone]; Fax: [phone]

## 2016-10-12 DIAGNOSIS — M2550 Pain in unspecified joint: Secondary | ICD-10-CM | POA: Diagnosis not present

## 2016-10-12 DIAGNOSIS — M810 Age-related osteoporosis without current pathological fracture: Secondary | ICD-10-CM | POA: Diagnosis not present

## 2016-10-12 DIAGNOSIS — E663 Overweight: Secondary | ICD-10-CM | POA: Diagnosis not present

## 2016-10-12 DIAGNOSIS — Z6829 Body mass index (BMI) 29.0-29.9, adult: Secondary | ICD-10-CM | POA: Diagnosis not present

## 2016-10-12 DIAGNOSIS — R5383 Other fatigue: Secondary | ICD-10-CM | POA: Diagnosis not present

## 2016-10-12 DIAGNOSIS — M7989 Other specified soft tissue disorders: Secondary | ICD-10-CM | POA: Diagnosis not present

## 2016-10-12 DIAGNOSIS — M06041 Rheumatoid arthritis without rheumatoid factor, right hand: Secondary | ICD-10-CM | POA: Diagnosis not present

## 2016-10-12 DIAGNOSIS — D0512 Intraductal carcinoma in situ of left breast: Secondary | ICD-10-CM | POA: Diagnosis not present

## 2016-10-17 DIAGNOSIS — E782 Mixed hyperlipidemia: Secondary | ICD-10-CM | POA: Diagnosis not present

## 2016-10-17 DIAGNOSIS — F411 Generalized anxiety disorder: Secondary | ICD-10-CM | POA: Diagnosis not present

## 2016-10-17 DIAGNOSIS — I2510 Atherosclerotic heart disease of native coronary artery without angina pectoris: Secondary | ICD-10-CM | POA: Diagnosis not present

## 2016-10-17 DIAGNOSIS — I700 Atherosclerosis of aorta: Secondary | ICD-10-CM | POA: Diagnosis not present

## 2016-10-17 DIAGNOSIS — F418 Other specified anxiety disorders: Secondary | ICD-10-CM | POA: Diagnosis not present

## 2016-10-17 DIAGNOSIS — M810 Age-related osteoporosis without current pathological fracture: Secondary | ICD-10-CM | POA: Diagnosis not present

## 2016-10-17 DIAGNOSIS — I712 Thoracic aortic aneurysm, without rupture: Secondary | ICD-10-CM | POA: Diagnosis not present

## 2016-10-17 DIAGNOSIS — I10 Essential (primary) hypertension: Secondary | ICD-10-CM | POA: Diagnosis not present

## 2016-10-17 DIAGNOSIS — Z0000 Encounter for general adult medical examination without abnormal findings: Secondary | ICD-10-CM | POA: Diagnosis not present

## 2016-10-19 ENCOUNTER — Other Ambulatory Visit: Payer: Self-pay | Admitting: Interventional Cardiology

## 2016-10-19 ENCOUNTER — Other Ambulatory Visit: Payer: Self-pay | Admitting: *Deleted

## 2016-10-19 MED ORDER — METOPROLOL SUCCINATE ER 50 MG PO TB24: 50.0000 mg | ORAL_TABLET | Freq: Two times a day (BID) | ORAL | 0 refills | Status: DC

## 2016-11-26 ENCOUNTER — Other Ambulatory Visit: Payer: Self-pay | Admitting: Interventional Cardiology

## 2016-11-26 NOTE — Telephone Encounter (Signed)
Medication Detail    Disp Refills Start End   metoprolol succinate (TOPROL-XL) 50 MG 24 hr tablet 180 tablet 3 10/19/2016    Sig: TAKE 1 TABLET BY MOUTH TWICE DAILY WITH OR IMMEDIATELY FOLLOWING A MEAL   Sent to pharmacy as: metoprolol succinate (TOPROL-XL) 50 MG 24 hr tablet   Notes to Pharmacy: **Patient requests 90 days supply**   E-Prescribing Status: Receipt confirmed by pharmacy (10/19/2016 4:00 PM EDT)   Pharmacy   [id number] - [place], [address] AT [address]

## 2016-12-04 DIAGNOSIS — F418 Other specified anxiety disorders: Secondary | ICD-10-CM | POA: Diagnosis not present

## 2016-12-04 DIAGNOSIS — I10 Essential (primary) hypertension: Secondary | ICD-10-CM | POA: Diagnosis not present

## 2017-01-07 DIAGNOSIS — M546 Pain in thoracic spine: Secondary | ICD-10-CM | POA: Diagnosis not present

## 2017-01-07 DIAGNOSIS — M79671 Pain in right foot: Secondary | ICD-10-CM | POA: Diagnosis not present

## 2017-01-07 DIAGNOSIS — M545 Low back pain: Secondary | ICD-10-CM | POA: Diagnosis not present

## 2017-01-09 ENCOUNTER — Emergency Department (HOSPITAL_COMMUNITY): Payer: Medicare Other

## 2017-01-09 ENCOUNTER — Emergency Department (HOSPITAL_COMMUNITY)
Admission: EM | Admit: 2017-01-09 | Discharge: 2017-01-09 | Disposition: A | Discharge status: Home / Self Care | Payer: Medicare Other | Attending: Emergency Medicine | Admitting: Emergency Medicine

## 2017-01-09 DIAGNOSIS — Y929 Unspecified place or not applicable: Secondary | ICD-10-CM | POA: Insufficient documentation

## 2017-01-09 DIAGNOSIS — Z87891 Personal history of nicotine dependence: Secondary | ICD-10-CM | POA: Diagnosis not present

## 2017-01-09 DIAGNOSIS — R1013 Epigastric pain: Secondary | ICD-10-CM | POA: Diagnosis not present

## 2017-01-09 DIAGNOSIS — X58XXXA Exposure to other specified factors, initial encounter: Secondary | ICD-10-CM | POA: Insufficient documentation

## 2017-01-09 DIAGNOSIS — Y999 Unspecified external cause status: Secondary | ICD-10-CM | POA: Insufficient documentation

## 2017-01-09 DIAGNOSIS — I10 Essential (primary) hypertension: Secondary | ICD-10-CM | POA: Diagnosis not present

## 2017-01-09 DIAGNOSIS — S22000A Wedge compression fracture of unspecified thoracic vertebra, initial encounter for closed fracture: Secondary | ICD-10-CM | POA: Diagnosis not present

## 2017-01-09 DIAGNOSIS — R109 Unspecified abdominal pain: Secondary | ICD-10-CM | POA: Diagnosis not present

## 2017-01-09 DIAGNOSIS — M4854XA Collapsed vertebra, not elsewhere classified, thoracic region, initial encounter for fracture: Secondary | ICD-10-CM | POA: Insufficient documentation

## 2017-01-09 DIAGNOSIS — R9431 Abnormal electrocardiogram [ECG] [EKG]: Secondary | ICD-10-CM | POA: Diagnosis not present

## 2017-01-09 DIAGNOSIS — E785 Hyperlipidemia, unspecified: Secondary | ICD-10-CM | POA: Diagnosis not present

## 2017-01-09 DIAGNOSIS — Z79899 Other long term (current) drug therapy: Secondary | ICD-10-CM | POA: Diagnosis not present

## 2017-01-09 DIAGNOSIS — R079 Chest pain, unspecified: Secondary | ICD-10-CM | POA: Diagnosis not present

## 2017-01-09 DIAGNOSIS — Y9389 Activity, other specified: Secondary | ICD-10-CM | POA: Diagnosis not present

## 2017-01-09 DIAGNOSIS — S29001A Unspecified injury of muscle and tendon of front wall of thorax, initial encounter: Secondary | ICD-10-CM | POA: Diagnosis present

## 2017-01-09 LAB — URINALYSIS, ROUTINE W REFLEX MICROSCOPIC
Bilirubin Urine: NEGATIVE
Glucose, UA: NEGATIVE mg/dL
HGB URINE DIPSTICK: NEGATIVE
Ketones, ur: NEGATIVE mg/dL
Nitrite: NEGATIVE
PH: 5 (ref 5.0–8.0)
Protein, ur: NEGATIVE mg/dL
SPECIFIC GRAVITY, URINE: 1.018 (ref 1.005–1.030)

## 2017-01-09 LAB — COMPREHENSIVE METABOLIC PANEL
ALBUMIN: 3.6 g/dL (ref 3.5–5.0)
ALT: 12 U/L — AB (ref 14–54)
AST: 21 U/L (ref 15–41)
Alkaline Phosphatase: 66 U/L (ref 38–126)
Anion gap: 9 (ref 5–15)
BUN: 13 mg/dL (ref 6–20)
CHLORIDE: 98 mmol/L — AB (ref 101–111)
CO2: 28 mmol/L (ref 22–32)
CREATININE: 1.07 mg/dL — AB (ref 0.44–1.00)
Calcium: 9.2 mg/dL (ref 8.9–10.3)
GFR calc non Af Amer: 48 mL/min — ABNORMAL LOW (ref >60)
GFR, EST AFRICAN AMERICAN: 56 mL/min — AB (ref >60)
GLUCOSE: 119 mg/dL — AB (ref 65–99)
Potassium: 3.2 mmol/L — ABNORMAL LOW (ref 3.5–5.1)
SODIUM: 135 mmol/L (ref 135–145)
Total Bilirubin: 0.7 mg/dL (ref 0.3–1.2)
Total Protein: 6 g/dL — ABNORMAL LOW (ref 6.5–8.1)

## 2017-01-09 LAB — CBC WITH DIFFERENTIAL/PLATELET
BASOS ABS: 0 10*3/uL (ref 0.0–0.1)
BASOS PCT: 0 %
EOS ABS: 0.2 10*3/uL (ref 0.0–0.7)
EOS PCT: 2 %
HCT: 33.7 % — ABNORMAL LOW (ref 36.0–46.0)
HEMOGLOBIN: 11.8 g/dL — AB (ref 12.0–15.0)
Lymphocytes Relative: 9 %
Lymphs Abs: 1.1 10*3/uL (ref 0.7–4.0)
MCH: 30.6 pg (ref 26.0–34.0)
MCHC: 35 g/dL (ref 30.0–36.0)
MCV: 87.3 fL (ref 78.0–100.0)
Monocytes Absolute: 1.2 10*3/uL — ABNORMAL HIGH (ref 0.1–1.0)
Monocytes Relative: 11 %
Neutro Abs: 8.9 10*3/uL — ABNORMAL HIGH (ref 1.7–7.7)
Neutrophils Relative %: 78 %
PLATELETS: 210 10*3/uL (ref 150–400)
RBC: 3.86 MIL/uL — AB (ref 3.87–5.11)
RDW: 13.9 % (ref 11.5–15.5)
WBC: 11.4 10*3/uL — AB (ref 4.0–10.5)

## 2017-01-09 LAB — LIPASE, BLOOD: Lipase: 26 U/L (ref 11–51)

## 2017-01-09 LAB — I-STAT CG4 LACTIC ACID, ED: LACTIC ACID, VENOUS: 1.83 mmol/L (ref 0.5–1.9)

## 2017-01-09 MED ORDER — OXYCODONE HCL 5 MG PO TABS: 5.0000 mg | ORAL_TABLET | Freq: Two times a day (BID) | ORAL | 0 refills | Status: DC | PRN

## 2017-01-09 MED ORDER — SODIUM CHLORIDE 0.9 % IV BOLUS (SEPSIS)
1000.0000 mL | Freq: Once | INTRAVENOUS | Status: AC
  Administered 2017-01-09: 1000 mL via INTRAVENOUS

## 2017-01-09 MED ORDER — POTASSIUM CHLORIDE CRYS ER 20 MEQ PO TBCR
20.0000 meq | EXTENDED_RELEASE_TABLET | Freq: Once | ORAL | Status: AC
  Administered 2017-01-09: 20 meq via ORAL
  Filled 2017-01-09: qty 1

## 2017-01-09 MED ORDER — IOPAMIDOL (ISOVUE-370) INJECTION 76%
100.0000 mL | Freq: Once | INTRAVENOUS | Status: AC | PRN
  Administered 2017-01-09: 100 mL via INTRAVENOUS

## 2017-01-09 MED ORDER — FENTANYL CITRATE (PF) 100 MCG/2ML IJ SOLN
50.0000 ug | Freq: Once | INTRAMUSCULAR | Status: AC
  Administered 2017-01-09: 50 ug via INTRAVENOUS
  Filled 2017-01-09: qty 2

## 2017-01-09 NOTE — ED Notes (Signed)
PT states understanding of care given, follow up care, and medication prescribed. PT ambulated from ED to car with a steady gait. 

## 2017-01-09 NOTE — ED Triage Notes (Signed)
Pt from home via [facility]. C/o worsening, constant thoracic, back pain x2 days. Went to orthopedist, given Hydrocodone and took some last night w/ no relief. Endorses n/v, diaphoresis & loss of appetite. Hx of thoracic aneurysm. Given 324 ASA PTA by EMS which pt threw up. Given 4mg  Zofran & 1 NTG which helped pain. Also got 100cc fluid bolus due to BP drop.

## 2017-01-09 NOTE — ED Provider Notes (Signed)
[facility] DEPT Provider Note   CSN: [id number] Arrival date & time: 01/09/17  0256     History   Chief Complaint Chief Complaint  Patient presents with  . Back Pain    HPI Tiffany Kane is a 76 y.o. female.With a past medical history of ascending thoracic aneurysm presenting today with epigastric abdominal pain. This pain radiates bilaterally outward and straight to her back. Is been going on for 2 days. She has an x-ray by orthopedic surgery doctor who wants to schedule an MRI to be done. She states this started off as her sciatica pain but did not progress to something worse. She has had some nausea and vomiting as well. She was given Zofran and nitroglycerin by EMS which did not help her pain. Her blood pressure drops as she was given a fluid bolus by EMS. Currently she continues to have this abdominal pain which radiates to her back. There are no further complaints.  10 Systems reviewed and are negative for acute change except as noted in the HPI.   HPI  Past Medical History:  Diagnosis Date  . Anxiety   . Arthritis   . Breast cancer ([place]) 2002   DUCTAL IN SITU.Marland Kitchen1999  . Bronchitis   . Compression fracture    T-11  . Coronary artery disease   . Depression   . Depression    situational stress  . Esophageal reflux   . H/O ascites 09/2004  . High cholesterol   . Hyperlipemia   . Hyperlipidemia   . Hypertension   . Osteopenia   . Osteoporosis 03/2006  . Pneumonia   . Thoracic aneurysm    DR. Leonia Reeves  . Thyromegaly     Patient Active Problem List   Diagnosis Date Noted  . Aneurysm of thoracic aorta ([place]) 05/01/2013  . Essential hypertension, benign 05/01/2013  . Mixed hyperlipidemia 05/01/2013  . Chronic cough 04/14/2012  . 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 Community acquired pneumonia 03/21/2012  . Hypokalemia 03/21/2012    Past Surgical History:  Procedure Laterality Date  . ABDOMINAL HYSTERECTOMY      . BREAST BIOPSY    . CARDIAC CATHETERIZATION  06/1999  . CERVICAL SPINE SURGERY    . VIDEO BRONCHOSCOPY  05/26/2012   Procedure: VIDEO BRONCHOSCOPY WITH FLUORO;  Surgeon: Collene Gobble, MD;  Location: [facility];  Service: Cardiopulmonary;  Laterality: Bilateral;    OB History    No data available       Home Medications    Prior to Admission medications   Medication Sig Start Date End Date Taking? Authorizing Provider  ALPRAZolam Duanne Moron) 0.5 MG tablet Take 0.5 mg by mouth daily as needed. For anxiety 01/01/12  Yes [provider]  aspirin EC 81 MG tablet Take 81 mg by mouth daily.   Yes [provider]  escitalopram (LEXAPRO) 10 MG tablet Take 10 mg by mouth daily. 12/28/16  Yes [provider]  folic acid (FOLVITE) 1 MG tablet Take 1 mg by mouth daily. 01/07/17  Yes [provider]  HYDROcodone-acetaminophen (NORCO/VICODIN) 5-325 MG tablet Take 1 tablet by mouth 2 (two) times daily as needed for moderate pain.  01/07/17  Yes [provider]  meloxicam (MOBIC) 15 MG tablet Take 7.5 mg by mouth 2 (two) times daily. 09/28/16  Yes [provider]  metoprolol succinate (TOPROL-XL) 50 MG 24 hr tablet TAKE 1 TABLET BY MOUTH TWICE DAILY WITH OR IMMEDIATELY FOLLOWING A MEAL 10/19/16  Yes  Jettie Booze, MD  simvastatin (ZOCOR) 80 MG tablet Take 40 mg by mouth at bedtime.   Yes [provider]  triamterene-hydrochlorothiazide (MAXZIDE-25) 37.5-25 MG per tablet Take 1 tablet by mouth daily.   Yes [provider]  oxyCODONE (ROXICODONE) 5 MG immediate release tablet Take 1 tablet (5 mg total) by mouth 2 (two) times daily as needed for severe pain. 01/09/17   Everlene Balls, MD    Family History Family History  Problem Relation Age of Onset  . Breast cancer Mother   . Diabetes Mother   . Hypertension Mother   . Bone cancer Father   . Prostate cancer Father   . Heart disease Brother   . Prostate cancer Brother   .  Hypertension Sister   . Diabetes Sister   . Heart attack Brother     Social History Social History  Substance Use Topics  . Smoking status: Former Smoker    Packs/day: 0.50    Years: 6.00    Types: Cigarettes    Quit date: 05/14/1992  . Smokeless tobacco: Never Used  . Alcohol use Yes     Comment: RARELY /once year      Allergies   Demerol [meperidine]; Lexapro [escitalopram oxalate]; Prednisone; and Zoloft [sertraline hcl]   Review of Systems Review of Systems   Physical Exam Updated Vital Signs BP (!) 116/58   Pulse (!) 59   Temp 97.9 F (36.6 C) (Oral)   Resp 19   Wt 68.9 kg (152 lb)   SpO2 99%   BMI 28.72 kg/m   Physical Exam  Constitutional: She is oriented to person, place, and time. She appears well-developed and well-nourished. No distress.  HENT:  Head: Normocephalic and atraumatic.  Nose: Nose normal.  Mouth/Throat: Oropharynx is clear and moist. No oropharyngeal exudate.  Eyes: Pupils are equal, round, and reactive to light. Conjunctivae and EOM are normal. No scleral icterus.  Neck: Normal range of motion. Neck supple. No JVD present. No tracheal deviation present. No thyromegaly present.  Cardiovascular: Normal rate, regular rhythm and normal heart sounds.  Exam reveals no gallop and no friction rub.   No murmur heard. Pulmonary/Chest: Effort normal and breath sounds normal. No respiratory distress. She has no wheezes. She exhibits no tenderness.  Abdominal: Soft. Bowel sounds are normal. She exhibits no distension and no mass. There is no tenderness. There is no rebound and no guarding.  Musculoskeletal: Normal range of motion. She exhibits no edema or tenderness.  Lymphadenopathy:    She has no cervical adenopathy.  Neurological: She is alert and oriented to person, place, and time. No cranial nerve deficit. She exhibits normal muscle tone.  Skin: Skin is warm and dry. No rash noted. No erythema. No pallor.  Nursing note and vitals  reviewed.    ED Treatments / Results  Labs (all labs ordered are listed, but only abnormal results are displayed) Labs Reviewed  CBC WITH DIFFERENTIAL/PLATELET - Abnormal; Notable for the following:       Result Value   WBC 11.4 (*)    RBC 3.86 (*)    Hemoglobin 11.8 (*)    HCT 33.7 (*)    Neutro Abs 8.9 (*)    Monocytes Absolute 1.2 (*)    All other components within normal limits  COMPREHENSIVE METABOLIC PANEL - Abnormal; Notable for the following:    Potassium 3.2 (*)    Chloride 98 (*)    Glucose, Bld 119 (*)    Creatinine, Ser 1.07 (*)  Total Protein 6.0 (*)    ALT 12 (*)    GFR calc non Af Amer 48 (*)    GFR calc Af Amer 56 (*)    All other components within normal limits  URINALYSIS, ROUTINE W REFLEX MICROSCOPIC - Abnormal; Notable for the following:    Color, Urine STRAW (*)    Leukocytes, UA TRACE (*)    Bacteria, UA RARE (*)    Squamous Epithelial / LPF 0-5 (*)    All other components within normal limits  URINE CULTURE  LIPASE, BLOOD  I-STAT CG4 LACTIC ACID, ED    EKG  EKG Interpretation  Date/Time:  Wednesday January 09 2017 03:47:18 EDT Ventricular Rate:  55 PR Interval:    QRS Duration: 91 QT Interval:  446 QTC Calculation: 427 R Axis:   -25 Text Interpretation:  Sinus rhythm Borderline left axis deviation No significant change since last tracing Confirmed by Glynn Octave [phone]) on 01/09/2017 4:18:18 AM       Radiology Ct Angio Chest/abd/pel For Dissection W And/or Wo Contrast  Result Date: 01/09/2017 CLINICAL DATA:  Abdominal pain. EXAM: CT ANGIOGRAPHY CHEST, ABDOMEN AND PELVIS TECHNIQUE: Multidetector CT imaging through the chest, abdomen and pelvis was performed using the standard protocol during bolus administration of intravenous contrast. Multiplanar reconstructed images and MIPs were obtained and reviewed to evaluate the vascular anatomy. CONTRAST:  100 mL Isovue 370 intravenous COMPARISON:  12/27/2014, 08/09/2016. FINDINGS: CTA  CHEST FINDINGS Cardiovascular: Ascending thoracic aortic ectasia, up to 4.2 cm. Unchanged. Moderate aortic and coronary atherosclerosis. No aortic dissection. Conventional 3 vessel configuration of the great vessel origins, patent. Pulmonary arteries are also well opacified, without evidence of embolism. Mediastinum/Nodes: No enlarged mediastinal, hilar, or axillary lymph nodes. Thyroid gland, trachea, and esophagus demonstrate no significant findings. Lungs/Pleura: Stable chronic linear and ground-glass opacities in both lungs, without significant change from 12/27/2014. No suspicious masses or nodules. Airways are patent and normal in caliber. Musculoskeletal: Stable compression T11. New compression of T8 since 12/27/2014, although it probably is chronic. Both compressions appear benign. No significant skeletal lesion. Review of the MIP images confirms the above findings. CTA ABDOMEN AND PELVIS FINDINGS VASCULAR Aorta: Normal caliber aorta without aneurysm, dissection, vasculitis or significant stenosis. Moderate atherosclerotic calcification. Celiac: Patent without evidence of aneurysm, dissection, vasculitis or significant stenosis. SMA: Patent without evidence of aneurysm, dissection, vasculitis or significant stenosis. Renals: Single renal artery bilaterally. Both renal arteries are patent without evidence of aneurysm, dissection, vasculitis, fibromuscular dysplasia or significant stenosis. IMA: Patent without evidence of aneurysm, dissection, vasculitis or significant stenosis. Inflow: Patent without evidence of aneurysm, dissection, vasculitis or significant stenosis. Veins: No obvious venous abnormality within the limitations of this arterial phase study. Review of the MIP images confirms the above findings. NON-VASCULAR Hepatobiliary: No focal liver abnormality is seen. No gallstones, gallbladder wall thickening, or biliary dilatation. Pancreas: Unremarkable. No pancreatic ductal dilatation or surrounding  inflammatory changes. Spleen: Normal in size without focal abnormality. Adrenals/Urinary Tract: Adrenal glands are unremarkable. Kidneys are normal, without renal calculi, focal lesion, or hydronephrosis. Bladder is unremarkable. Stomach/Bowel: Hiatal hernia. Stomach and small bowel are unremarkable. Appendix is normal. Mild to moderate uncomplicated colonic diverticulosis. No inflammation or obstruction of bowel. Lymphatic: No adenopathy in the abdomen or pelvis. Reproductive: Hysterectomy.  No adnexal abnormalities. Other: No focal inflammation.  No ascites. Musculoskeletal: No significant skeletal lesion. Review of the MIP images confirms the above findings. IMPRESSION: 1. Stable ascending thoracic aortic ectasia, 4.2 cm. 2. Aortic and coronary atherosclerosis. 3. No  aortic dissection. 4. Stable chronic lung opacities as previously described. No acute findings in the chest. 5. Hiatal hernia. 6. Colonic diverticulosis. 7. No acute findings in the abdomen or pelvis. 8. Benign compressions of T8 and T11. The T8 compression is new from 12/27/2014 although it does not appear to be recent. Electronically Signed   By: Andreas Newport M.D.   On: 01/09/2017 05:49    Procedures Procedures (including critical care time)  Medications Ordered in ED Medications  potassium chloride SA (K-DUR,KLOR-CON) CR tablet 20 mEq (not administered)  sodium chloride 0.9 % bolus 1,000 mL (0 mLs Intravenous Stopped 01/09/17 0429)  fentaNYL (SUBLIMAZE) injection 50 mcg (50 mcg Intravenous Given 01/09/17 0354)  iopamidol (ISOVUE-370) 76 % injection 100 mL (100 mLs Intravenous Contrast Given 01/09/17 0459)     Initial Impression / Assessment and Plan / ED Course  I have reviewed the triage vital signs and the nursing notes.  Pertinent labs & imaging results that were available during my care of the patient were reviewed by me and considered in my medical decision making (see chart for details).     Patient presents emergency  department for abdominal pain with radiation to the back. She has a history of aneurysm zolpidem and CT scan of the aorta for further evaluation. Laboratory studies are pending. She was given senna for pain control. We'll continue to closely monitor.  6:34 AM upon repeat evaluation, patient states he feels much better. Laboratory studies are at her normal baseline, potassium was replaced. Urinalysis negative for infection. CT scan only reveals impression fractures of the thoracic spine. Patient was educated. She has MRI scheduled next week advised to make this appointment for further diagnosis. She'll discuss any of this plan. We'll give oxycodone 8 tablets to take at home as needed. Encouraged Tylenol or ibuprofen first. Vital signs were within her normal limits and she is safe for discharge.  Final Clinical Impressions(s) / ED Diagnoses   Final diagnoses:  Compression fracture of body of thoracic vertebra (HCC)    New Prescriptions New Prescriptions   OXYCODONE (ROXICODONE) 5 MG IMMEDIATE RELEASE TABLET    Take 1 tablet (5 mg total) by mouth 2 (two) times daily as needed for severe pain.     Everlene Balls, MD 01/09/17 [phone]

## 2017-01-10 LAB — URINE CULTURE

## 2017-01-16 DIAGNOSIS — M546 Pain in thoracic spine: Secondary | ICD-10-CM | POA: Diagnosis not present

## 2017-01-18 DIAGNOSIS — R109 Unspecified abdominal pain: Secondary | ICD-10-CM | POA: Diagnosis not present

## 2017-01-18 DIAGNOSIS — Z23 Encounter for immunization: Secondary | ICD-10-CM | POA: Diagnosis not present

## 2017-01-18 DIAGNOSIS — M549 Dorsalgia, unspecified: Secondary | ICD-10-CM | POA: Diagnosis not present

## 2017-01-18 DIAGNOSIS — M545 Low back pain: Secondary | ICD-10-CM | POA: Diagnosis not present

## 2017-01-29 ENCOUNTER — Other Ambulatory Visit: Payer: Self-pay | Admitting: Gastroenterology

## 2017-01-29 DIAGNOSIS — R1010 Upper abdominal pain, unspecified: Secondary | ICD-10-CM | POA: Diagnosis not present

## 2017-01-29 DIAGNOSIS — R1084 Generalized abdominal pain: Secondary | ICD-10-CM

## 2017-02-05 DIAGNOSIS — R1011 Right upper quadrant pain: Secondary | ICD-10-CM | POA: Diagnosis not present

## 2017-02-05 DIAGNOSIS — K2930 Chronic superficial gastritis without bleeding: Secondary | ICD-10-CM | POA: Diagnosis not present

## 2017-02-05 DIAGNOSIS — K449 Diaphragmatic hernia without obstruction or gangrene: Secondary | ICD-10-CM | POA: Diagnosis not present

## 2017-02-05 DIAGNOSIS — R112 Nausea with vomiting, unspecified: Secondary | ICD-10-CM | POA: Diagnosis not present

## 2017-02-05 DIAGNOSIS — K269 Duodenal ulcer, unspecified as acute or chronic, without hemorrhage or perforation: Secondary | ICD-10-CM | POA: Diagnosis not present

## 2017-02-08 ENCOUNTER — Ambulatory Visit
Admission: RE | Admit: 2017-02-08 | Discharge: 2017-02-08 | Disposition: A | Discharge status: Home / Self Care | Payer: Medicare Other | Source: Ambulatory Visit | Attending: Gastroenterology | Admitting: Gastroenterology

## 2017-02-08 DIAGNOSIS — R1084 Generalized abdominal pain: Secondary | ICD-10-CM

## 2017-02-08 DIAGNOSIS — K838 Other specified diseases of biliary tract: Secondary | ICD-10-CM | POA: Diagnosis not present

## 2017-02-11 DIAGNOSIS — K2930 Chronic superficial gastritis without bleeding: Secondary | ICD-10-CM | POA: Diagnosis not present

## 2017-02-28 ENCOUNTER — Ambulatory Visit (INDEPENDENT_AMBULATORY_CARE_PROVIDER_SITE_OTHER): Payer: Medicare Other | Admitting: Emergency Medicine

## 2017-02-28 ENCOUNTER — Encounter: Payer: Self-pay | Admitting: Emergency Medicine

## 2017-02-28 DIAGNOSIS — J8489 Other specified interstitial pulmonary diseases: Secondary | ICD-10-CM | POA: Diagnosis not present

## 2017-02-28 NOTE — Progress Notes (Signed)
Subjective:   b Patient ID: Tiffany Kane, female    DOB: January 19, 1941, 76 y.o.   MRN: [id number] HPI Original HX: 76 yo remote smoker (3 pk-yrs) with hx HTN, ? Breast CA (with negative post-op path after positive needle bx 08-28-00), allergies and sinus disease. She is referred for eval by Dr Jonny Ruiz for cough and abnormal CXR/CT scan. She was hospitalized early Nov '13 for apparent CAP - experienced fatigue, cough productive of , loss of appetite, pain with cough. She is improved, still with a hacky cough, happens w exertion or with a lot of talking. She has a hx of CT scan chest from 09/2011 and 9/13 that show scattered multilobar small GG opacities, stable on serial exams. Prompted by abnormal CXR in 09/2011 (for cough and ? PNA). She is clinically better than hospital but not back to her baseline of mid-October. She has nasal gtt and sinus fullness. Rarely has GERD sx.     ROV 12/24/14 -- follows for cough, abnormal CT scan chest, suspected COP with negative bx's and cx's on FOB 05/26/12. Was treated with pred with large scale clearing on CT scan 08/25/12. She had done well until Spring / summer of this year, developed cough and dyspnea (new compared with our visits in 08/28/12). Repeat CT scan of the chest was performed on 11/02/14 I reviewed this personally. It showed new patchy areas of groundglass bilaterally more pronounced than in Aug 28, 2012 but in a similar distribution. She was treated with abx without any improvement. She saw Dr Melvyn Novas 6/30 > was started on pred 20mg  daily for ~ 2 weeks, then to 10mg  daily on 11/27/14. She states that her breathing has improved on the pred. She is starting an exercise program at the [facility]. Her cough has resolved.    ROV 02/24/15 -- follow-up visit for chronic cough and history of suspected COP first in 28-Aug-2012 and now more recently in the summer of 2016. At her last visit we stopped her prednisone.  She underwent full pulmonary function testing today and I have personally  reviewed the results. These show normal airflows, a normal total lung capacity with a decreased RV consistent with possible mild restriction and a normal diffusion capacity. She tolerated stopping the pred (although had to take a medrol dose pack for a back injury in the interim, finished it mid September). Her breathing is good. Not coughing, no CP.   ROV 02/24/16 -- patient has a history of suspected COP that has been originally diagnosed in August 28, 2012 and then more recently seen in the summer of 2016. Her Pulmonary function testing following that episode showed some restriction without clear obstruction.  She caught a URI about 2 weeks ago, saw her PCP, flu test negative. She took azithro. Has improved now, sinus drainage is improving but not quite gone. No CXR done. She feels that she is getting better.   ROV 02/28/17 -- this follow-up visit for patient who has a history of COP that radiographically and clinically improved after she was treated with corticosteroids. She has not had a recurrence. Pulmonary function testing has shown some residual restriction but no obstruction. She tells me that since last time she has been dx with RA > B hand and joint swelling, positive serologies with Dr Trudie Reed. She has also been rx for a duodenal ulcer since last time. She is being evaluated for GB sludge. She underwent a CT chest 01/09/17 that I reviewed > no change compared with 12/2014.  Objective:   Physical Exam Vitals:   02/28/17 0854 02/28/17 0855  BP:  110/70  Pulse:  74  SpO2:  94%  Weight: 144 lb (65.3 kg)   Height: 5\' 1"  (1.549 m)     Wt Readings from Last 3 Encounters:  02/28/17 144 lb (65.3 kg)  01/09/17 152 lb (68.9 kg)  10/03/16 154 lb (69.9 kg)     Gen: Pleasant, well-nourished, in no distress,  normal affect  ENT: No lesions,  mouth clear,  oropharynx clear, no postnasal drip  Neck: No JVD, no TMG, no carotid bruits  Lungs: No use of accessory muscles, clear without rales or  rhonchi  Cardiovascular: RRR, heart sounds normal, no murmur or gallops, no peripheral edema  Musculoskeletal: No deformities, no cyanosis or clubbing  Neuro: alert, non focal  Skin: Warm, no lesions or rashes     01/09/17 --  FINDINGS: CTA CHEST FINDINGS  Cardiovascular: Ascending thoracic aortic ectasia, up to 4.2 cm. Unchanged. Moderate aortic and coronary atherosclerosis. No aortic dissection. Conventional 3 vessel configuration of the great vessel origins, patent. Pulmonary arteries are also well opacified, without evidence of embolism.  Mediastinum/Nodes: No enlarged mediastinal, hilar, or axillary lymph nodes. Thyroid gland, trachea, and esophagus demonstrate no significant findings.  Lungs/Pleura: Stable chronic linear and ground-glass opacities in both lungs, without significant change from 12/27/2014. No suspicious masses or nodules. Airways are patent and normal in caliber.  Musculoskeletal: Stable compression T11. New compression of T8 since 12/27/2014, although it probably is chronic. Both compressions appear benign. No significant skeletal lesion.  Review of the MIP images confirms the above findings.  CTA ABDOMEN AND PELVIS FINDINGS  VASCULAR  Aorta: Normal caliber aorta without aneurysm, dissection, vasculitis or significant stenosis. Moderate atherosclerotic calcification.  Celiac: Patent without evidence of aneurysm, dissection, vasculitis or significant stenosis.  SMA: Patent without evidence of aneurysm, dissection, vasculitis or significant stenosis.  Renals: Single renal artery bilaterally. Both renal arteries are patent without evidence of aneurysm, dissection, vasculitis, fibromuscular dysplasia or significant stenosis.  IMA: Patent without evidence of aneurysm, dissection, vasculitis or significant stenosis.  Inflow: Patent without evidence of aneurysm, dissection, vasculitis or significant stenosis.  Veins: No obvious  venous abnormality within the limitations of this arterial phase study.  Review of the MIP images confirms the above findings.  NON-VASCULAR  Hepatobiliary: No focal liver abnormality is seen. No gallstones, gallbladder wall thickening, or biliary dilatation.  Pancreas: Unremarkable. No pancreatic ductal dilatation or surrounding inflammatory changes.  Spleen: Normal in size without focal abnormality.  Adrenals/Urinary Tract: Adrenal glands are unremarkable. Kidneys are normal, without renal calculi, focal lesion, or hydronephrosis. Bladder is unremarkable.  Stomach/Bowel: Hiatal hernia. Stomach and small bowel are unremarkable. Appendix is normal. Mild to moderate uncomplicated colonic diverticulosis. No inflammation or obstruction of bowel.  Lymphatic: No adenopathy in the abdomen or pelvis.  Reproductive: Hysterectomy.  No adnexal abnormalities.  Other: No focal inflammation.  No ascites.  Musculoskeletal: No significant skeletal lesion.  Review of the MIP images confirms the above findings.  IMPRESSION: 1. Stable ascending thoracic aortic ectasia, 4.2 cm. 2. Aortic and coronary atherosclerosis. 3. No aortic dissection. 4. Stable chronic lung opacities as previously described. No acute findings in the chest. 5. Hiatal hernia. 6. Colonic diverticulosis. 7. No acute findings in the abdomen or pelvis. 8. Benign compressions of T8 and T11. The T8 compression is new from 12/27/2014 although it does not appear to be recent.   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 In retrospect wonder if this was related to rheumatoid disease. She was recently diagnosed with rheumatoid arthritis. Her breathing is stable. Her CT scan of the chest 01/09/17 is unchanged compared with 2 years prior  (after steroid treatment). We will continue to follow her clinically and with intermittent chest x-rays or CT scans depending on her clinical status.   Baltazar Apo, MD, PhD 02/28/2017, 9:21 AM [facility] [phone] or if no answer [phone]

## 2017-02-28 NOTE — Assessment & Plan Note (Signed)
History of COP from 2016, improved with steroids. In retrospect wonder if this was related to rheumatoid disease. She was recently diagnosed with rheumatoid arthritis. Her breathing is stable. Her CT scan of the chest 01/09/17 is unchanged compared with 2 years prior (after steroid treatment). We will continue to follow her clinically and with intermittent chest x-rays or CT scans depending on her clinical status.

## 2017-02-28 NOTE — Patient Instructions (Signed)
Your CT scan of the chest from 01/09/17 shows very subtle remnants of your inflammation treated in 2016. Been no increase or change since we treated June 2016. This is good news. We will continue to follow you with chest x-rays or CT scans to ensure stability as we go forward Follow with Dr Lamonte Sakai in 12 months or sooner if you have any problems

## 2017-03-12 DIAGNOSIS — K269 Duodenal ulcer, unspecified as acute or chronic, without hemorrhage or perforation: Secondary | ICD-10-CM | POA: Diagnosis not present

## 2017-03-13 DIAGNOSIS — Z6827 Body mass index (BMI) 27.0-27.9, adult: Secondary | ICD-10-CM | POA: Diagnosis not present

## 2017-03-13 DIAGNOSIS — R5383 Other fatigue: Secondary | ICD-10-CM | POA: Diagnosis not present

## 2017-03-13 DIAGNOSIS — M2550 Pain in unspecified joint: Secondary | ICD-10-CM | POA: Diagnosis not present

## 2017-03-13 DIAGNOSIS — M810 Age-related osteoporosis without current pathological fracture: Secondary | ICD-10-CM | POA: Diagnosis not present

## 2017-03-13 DIAGNOSIS — E663 Overweight: Secondary | ICD-10-CM | POA: Diagnosis not present

## 2017-03-13 DIAGNOSIS — D0512 Intraductal carcinoma in situ of left breast: Secondary | ICD-10-CM | POA: Diagnosis not present

## 2017-03-13 DIAGNOSIS — K269 Duodenal ulcer, unspecified as acute or chronic, without hemorrhage or perforation: Secondary | ICD-10-CM | POA: Diagnosis not present

## 2017-03-13 DIAGNOSIS — M06041 Rheumatoid arthritis without rheumatoid factor, right hand: Secondary | ICD-10-CM | POA: Diagnosis not present

## 2017-03-25 DIAGNOSIS — Z853 Personal history of malignant neoplasm of breast: Secondary | ICD-10-CM | POA: Diagnosis not present

## 2017-03-25 DIAGNOSIS — Z1231 Encounter for screening mammogram for malignant neoplasm of breast: Secondary | ICD-10-CM | POA: Diagnosis not present

## 2017-05-22 DIAGNOSIS — K269 Duodenal ulcer, unspecified as acute or chronic, without hemorrhage or perforation: Secondary | ICD-10-CM | POA: Diagnosis not present

## 2017-05-22 DIAGNOSIS — D0512 Intraductal carcinoma in situ of left breast: Secondary | ICD-10-CM | POA: Diagnosis not present

## 2017-05-22 DIAGNOSIS — M2550 Pain in unspecified joint: Secondary | ICD-10-CM | POA: Diagnosis not present

## 2017-05-22 DIAGNOSIS — E663 Overweight: Secondary | ICD-10-CM | POA: Diagnosis not present

## 2017-05-22 DIAGNOSIS — Z6828 Body mass index (BMI) 28.0-28.9, adult: Secondary | ICD-10-CM | POA: Diagnosis not present

## 2017-05-22 DIAGNOSIS — M810 Age-related osteoporosis without current pathological fracture: Secondary | ICD-10-CM | POA: Diagnosis not present

## 2017-05-22 DIAGNOSIS — M06041 Rheumatoid arthritis without rheumatoid factor, right hand: Secondary | ICD-10-CM | POA: Diagnosis not present

## 2017-05-22 DIAGNOSIS — Z79899 Other long term (current) drug therapy: Secondary | ICD-10-CM | POA: Diagnosis not present

## 2017-06-10 DIAGNOSIS — E782 Mixed hyperlipidemia: Secondary | ICD-10-CM | POA: Diagnosis not present

## 2017-06-10 DIAGNOSIS — I2510 Atherosclerotic heart disease of native coronary artery without angina pectoris: Secondary | ICD-10-CM | POA: Diagnosis not present

## 2017-06-10 DIAGNOSIS — I10 Essential (primary) hypertension: Secondary | ICD-10-CM | POA: Diagnosis not present

## 2017-08-20 DIAGNOSIS — Z79899 Other long term (current) drug therapy: Secondary | ICD-10-CM | POA: Diagnosis not present

## 2017-08-20 DIAGNOSIS — E663 Overweight: Secondary | ICD-10-CM | POA: Diagnosis not present

## 2017-08-20 DIAGNOSIS — D0512 Intraductal carcinoma in situ of left breast: Secondary | ICD-10-CM | POA: Diagnosis not present

## 2017-08-20 DIAGNOSIS — M2550 Pain in unspecified joint: Secondary | ICD-10-CM | POA: Diagnosis not present

## 2017-08-20 DIAGNOSIS — M06041 Rheumatoid arthritis without rheumatoid factor, right hand: Secondary | ICD-10-CM | POA: Diagnosis not present

## 2017-08-20 DIAGNOSIS — K269 Duodenal ulcer, unspecified as acute or chronic, without hemorrhage or perforation: Secondary | ICD-10-CM | POA: Diagnosis not present

## 2017-08-20 DIAGNOSIS — Z6828 Body mass index (BMI) 28.0-28.9, adult: Secondary | ICD-10-CM | POA: Diagnosis not present

## 2017-08-20 DIAGNOSIS — M810 Age-related osteoporosis without current pathological fracture: Secondary | ICD-10-CM | POA: Diagnosis not present

## 2017-09-10 ENCOUNTER — Other Ambulatory Visit: Payer: Medicare Other

## 2017-09-10 DIAGNOSIS — I712 Thoracic aortic aneurysm, without rupture, unspecified: Secondary | ICD-10-CM

## 2017-09-10 LAB — BASIC METABOLIC PANEL
BUN / CREAT RATIO: 20 (ref 12–28)
BUN: 18 mg/dL (ref 8–27)
CALCIUM: 9.5 mg/dL (ref 8.7–10.3)
CHLORIDE: 100 mmol/L (ref 96–106)
CO2: 24 mmol/L (ref 20–29)
CREATININE: 0.9 mg/dL (ref 0.57–1.00)
GFR calc Af Amer: 70 mL/min/{1.73_m2} (ref >59)
GFR calc non Af Amer: 61 mL/min/{1.73_m2} (ref >59)
GLUCOSE: 95 mg/dL (ref 65–99)
Potassium: 4.1 mmol/L (ref 3.5–5.2)
Sodium: 142 mmol/L (ref 134–144)

## 2017-09-13 ENCOUNTER — Ambulatory Visit (HOSPITAL_COMMUNITY)
Admission: RE | Admit: 2017-09-13 | Discharge: 2017-09-13 | Disposition: A | Discharge status: Home / Self Care | Payer: Medicare Other | Source: Ambulatory Visit | Attending: Interventional Cardiology | Admitting: Interventional Cardiology

## 2017-09-13 DIAGNOSIS — I712 Thoracic aortic aneurysm, without rupture, unspecified: Secondary | ICD-10-CM

## 2017-09-13 DIAGNOSIS — K449 Diaphragmatic hernia without obstruction or gangrene: Secondary | ICD-10-CM | POA: Insufficient documentation

## 2017-09-13 DIAGNOSIS — I708 Atherosclerosis of other arteries: Secondary | ICD-10-CM | POA: Insufficient documentation

## 2017-09-13 DIAGNOSIS — I700 Atherosclerosis of aorta: Secondary | ICD-10-CM | POA: Insufficient documentation

## 2017-09-13 MED ORDER — GADOBENATE DIMEGLUMINE 529 MG/ML IV SOLN
15.0000 mL | Freq: Once | INTRAVENOUS | Status: AC | PRN
  Administered 2017-09-13: 15 mL via INTRAVENOUS

## 2017-09-17 ENCOUNTER — Other Ambulatory Visit: Payer: Self-pay

## 2017-09-17 DIAGNOSIS — I712 Thoracic aortic aneurysm, without rupture, unspecified: Secondary | ICD-10-CM

## 2017-09-20 DIAGNOSIS — H5203 Hypermetropia, bilateral: Secondary | ICD-10-CM | POA: Diagnosis not present

## 2017-09-20 DIAGNOSIS — H43812 Vitreous degeneration, left eye: Secondary | ICD-10-CM | POA: Diagnosis not present

## 2017-09-20 DIAGNOSIS — H25811 Combined forms of age-related cataract, right eye: Secondary | ICD-10-CM | POA: Diagnosis not present

## 2017-09-20 DIAGNOSIS — H31001 Unspecified chorioretinal scars, right eye: Secondary | ICD-10-CM | POA: Diagnosis not present

## 2017-10-20 NOTE — Progress Notes (Signed)
Cardiology Office Note   Date:  10/22/2017   ID:  Tiffany Kane, DOB 04-14-1941, MRN [id number]  PCP:  Maurice Small, MD    No chief complaint on file.  Thoracic aneurysm  Wt Readings from Last 3 Encounters:  10/22/17 148 lb (67.1 kg)  02/28/17 144 lb (65.3 kg)  01/09/17 152 lb (68.9 kg)       History of Present Illness: Tiffany Kane is a 77 y.o. female   who has had a thoracic aneurysm. It was stable in size by MRA in 2011. Recent MRA in 3/17 showed a diameter of 4.4 cm of the thoracic aorta.  Exercise is Limited by back pain.  She has been walking and lost 9 lbs in late 2018.   She checks BP at home. It runs about 120s/70-80 .No elevated readings to the 150-160 range.  Both arms have been the same.  THere has been no significnat difference between the two arms.  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 She has not had a documented blood pressure difference between the 2 arms at any prior cardiology visit.  No weakness in her arms.  No problems holding items.  No difference in home BP readings between arms.  Denies : Chest pain. Dizziness. Leg edema. Nitroglycerin use. Orthopnea. Paroxysmal nocturnal dyspnea. Shortness of breath. Syncope.   Very rare, shortlived flutters.     Past Medical History:  Diagnosis Date  . Anxiety   . Arthritis   . Breast cancer ([place]) 2002   DUCTAL IN SITU.Marland Kitchen1999  . Bronchitis   . Compression fracture    T-11  . Coronary artery disease   . Depression   . Depression    situational stress  . Esophageal reflux   . H/O ascites 09/2004  . High cholesterol   . Hyperlipemia   . Hyperlipidemia   . Hypertension   . Osteopenia   . Osteoporosis 03/2006  . Pneumonia   . Thoracic aneurysm    DR. Leonia Reeves  . Thyromegaly     Past Surgical History:  Procedure Laterality Date  . ABDOMINAL HYSTERECTOMY    . BREAST BIOPSY    . CARDIAC CATHETERIZATION  06/1999  . CERVICAL SPINE SURGERY      . VIDEO BRONCHOSCOPY  05/26/2012   Procedure: VIDEO BRONCHOSCOPY WITH FLUORO;  Surgeon: Collene Gobble, MD;  Location: [facility];  Service: Cardiopulmonary;  Laterality: Bilateral;     Current Outpatient Medications  Medication Sig Dispense Refill  . ALPRAZolam (XANAX) 0.5 MG tablet Take 0.5 mg by mouth daily as needed. For anxiety    . aspirin EC 81 MG tablet Take 81 mg by mouth daily.    Marland Kitchen escitalopram (LEXAPRO) 10 MG tablet Take 10 mg by mouth daily.  11  . folic acid (FOLVITE) 1 MG tablet Take 1 mg by mouth daily.  1  . methotrexate (RHEUMATREX) 2.5 MG tablet Take 7 tablets by mouth once a week.  2  . metoprolol succinate (TOPROL-XL) 50 MG 24 hr tablet TAKE 1 TABLET BY MOUTH TWICE DAILY WITH OR IMMEDIATELY FOLLOWING A MEAL 180 tablet 3  . simvastatin (ZOCOR) 80 MG tablet Take 40 mg by mouth at bedtime.    . triamterene-hydrochlorothiazide (MAXZIDE-25) 37.5-25 MG per tablet Take 1 tablet by mouth daily.     No current facility-administered medications for this visit.     Allergies:   Demerol [meperidine]; Prednisone; and Zoloft [sertraline hcl]    Social  History:  The patient  reports that she quit smoking about 25 years ago. Her smoking use included cigarettes. She has a 3.00 pack-year smoking history. She has never used smokeless tobacco. She reports that she drinks alcohol. She reports that she does not use drugs.   Family History:  The patient's family history includes Bone cancer in her father; Breast cancer in her mother; Diabetes in her mother and sister; Heart attack in her brother; Heart disease in her brother; Hypertension in her mother and sister; Prostate cancer in her brother and father.    ROS:  Please see the history of present illness.   Otherwise, review of systems are positive for foot pain.   All other systems are reviewed and negative.    PHYSICAL EXAM: VS:  BP 118/78   Pulse 60   Ht 5\' 1"  (1.549 m)   Wt 148 lb (67.1 kg)   SpO2 92%   BMI 27.96 kg/m  ,  BMI Body mass index is 27.96 kg/m. GEN: Well nourished, well developed, in no acute distress  HEENT: normal  Neck: no JVD, carotid bruits, or masses Cardiac: RRR; no murmurs, rubs, or gallops,no edema , equal radial pulses bilaterally Respiratory:  clear to auscultation bilaterally, normal work of breathing GI: soft, nontender, nondistended, + BS MS: no deformity or atrophy  Skin: warm and dry, no rash Neuro:  Strength and sensation are intact Psych: euthymic mood, full affect   EKG:   The ekg ordered today demonstrates NSR, nonspecific ST changes   Recent Labs: 01/09/2017: ALT 12; Hemoglobin 11.8; Platelets 210 09/10/2017: BUN 18; Creatinine, Ser 0.90; Potassium 4.1; Sodium 142   Lipid Panel No results found for: CHOL, TRIG, HDL, CHOLHDL, VLDL, LDLCALC, LDLDIRECT   Other studies Reviewed: Additional studies/ records that were reviewed today with results demonstrating: 5/19 MRA reviewed; 4.2 cm Thoracic aneuysm, <50% stenosis in the left subclavian.   ASSESSMENT AND PLAN:  1. Thoracic aneurysm: Stable in size.  No symptoms.  Repeat imaging in May 2020.  I explained to her that the size of her aneurysm is currently closer to that normal value than the value where surgery would be considered.  She was reassured.  She will continue to check blood pressure at home on occasion. 2. HTN: Well-controlled.  Continue current medications including beta-blocker. 3. Hyperlipidemia: LDL 106.  Continue simvastatin 40 mg daily.  Subclavian stenosis is thought to be less than 50%. 4. CAD: Nonobstructive by cath in 2001.  Negative stress test in 2010.  No chest pain at this time.    Current medicines are reviewed at length with the patient today.  The patient concerns regarding her medicines were addressed.  The following changes have been made:  No change  Labs/ tests ordered today include:  No orders of the defined types were placed in this encounter.   Recommend 150 minutes/week of  aerobic exercise Low fat, low carb, high fiber diet recommended  Disposition:   FU in 1 year   Signed, Larae Grooms, MD  10/22/2017 8:24 AM    [facility] [place], [place], [place]  [address] Phone: [phone]; Fax: [phone]

## 2017-10-22 ENCOUNTER — Ambulatory Visit (INDEPENDENT_AMBULATORY_CARE_PROVIDER_SITE_OTHER): Payer: Medicare Other | Admitting: Interventional Cardiology

## 2017-10-22 ENCOUNTER — Encounter: Payer: Self-pay | Admitting: Interventional Cardiology

## 2017-10-22 VITALS — BP 118/78 | HR 60 | Ht 61.0 in | Wt 148.0 lb

## 2017-10-22 DIAGNOSIS — I10 Essential (primary) hypertension: Secondary | ICD-10-CM | POA: Diagnosis not present

## 2017-10-22 DIAGNOSIS — I712 Thoracic aortic aneurysm, without rupture, unspecified: Secondary | ICD-10-CM

## 2017-10-22 DIAGNOSIS — E782 Mixed hyperlipidemia: Secondary | ICD-10-CM | POA: Diagnosis not present

## 2017-10-22 NOTE — Patient Instructions (Signed)
Medication Instructions:  Your physician recommends that you continue on your current medications as directed. Please refer to the Current Medication list given to you today.   Labwork: None ordered  Testing/Procedures: Your physician has requested that you have a MRI in May 2020 to evaluate your thoracic aortic aneurysm.    Follow-Up: Your physician wants you to follow-up in: 1 year with Dr. Irish Lack. You will receive a reminder letter in the mail two months in advance. If you don't receive a letter, please call our office to schedule the follow-up appointment.   Any Other Special Instructions Will Be Listed Below (If Applicable).     If you need a refill on your cardiac medications before your next appointment, please call your pharmacy.

## 2017-10-23 DIAGNOSIS — I712 Thoracic aortic aneurysm, without rupture: Secondary | ICD-10-CM | POA: Diagnosis not present

## 2017-10-23 DIAGNOSIS — I10 Essential (primary) hypertension: Secondary | ICD-10-CM | POA: Diagnosis not present

## 2017-10-23 DIAGNOSIS — Z0000 Encounter for general adult medical examination without abnormal findings: Secondary | ICD-10-CM | POA: Diagnosis not present

## 2017-10-23 DIAGNOSIS — M069 Rheumatoid arthritis, unspecified: Secondary | ICD-10-CM | POA: Diagnosis not present

## 2017-10-23 DIAGNOSIS — I700 Atherosclerosis of aorta: Secondary | ICD-10-CM | POA: Diagnosis not present

## 2017-10-23 DIAGNOSIS — E782 Mixed hyperlipidemia: Secondary | ICD-10-CM | POA: Diagnosis not present

## 2017-10-23 DIAGNOSIS — M810 Age-related osteoporosis without current pathological fracture: Secondary | ICD-10-CM | POA: Diagnosis not present

## 2017-11-26 DIAGNOSIS — D0512 Intraductal carcinoma in situ of left breast: Secondary | ICD-10-CM | POA: Diagnosis not present

## 2017-11-26 DIAGNOSIS — K269 Duodenal ulcer, unspecified as acute or chronic, without hemorrhage or perforation: Secondary | ICD-10-CM | POA: Diagnosis not present

## 2017-11-26 DIAGNOSIS — M06041 Rheumatoid arthritis without rheumatoid factor, right hand: Secondary | ICD-10-CM | POA: Diagnosis not present

## 2017-11-26 DIAGNOSIS — M810 Age-related osteoporosis without current pathological fracture: Secondary | ICD-10-CM | POA: Diagnosis not present

## 2017-11-26 DIAGNOSIS — Z79899 Other long term (current) drug therapy: Secondary | ICD-10-CM | POA: Diagnosis not present

## 2017-11-26 DIAGNOSIS — Z6829 Body mass index (BMI) 29.0-29.9, adult: Secondary | ICD-10-CM | POA: Diagnosis not present

## 2017-11-26 DIAGNOSIS — M2550 Pain in unspecified joint: Secondary | ICD-10-CM | POA: Diagnosis not present

## 2017-11-26 DIAGNOSIS — E663 Overweight: Secondary | ICD-10-CM | POA: Diagnosis not present

## 2018-02-26 DIAGNOSIS — M2550 Pain in unspecified joint: Secondary | ICD-10-CM | POA: Diagnosis not present

## 2018-02-26 DIAGNOSIS — E663 Overweight: Secondary | ICD-10-CM | POA: Diagnosis not present

## 2018-02-26 DIAGNOSIS — Z6829 Body mass index (BMI) 29.0-29.9, adult: Secondary | ICD-10-CM | POA: Diagnosis not present

## 2018-02-26 DIAGNOSIS — M810 Age-related osteoporosis without current pathological fracture: Secondary | ICD-10-CM | POA: Diagnosis not present

## 2018-02-26 DIAGNOSIS — Z79899 Other long term (current) drug therapy: Secondary | ICD-10-CM | POA: Diagnosis not present

## 2018-02-26 DIAGNOSIS — K269 Duodenal ulcer, unspecified as acute or chronic, without hemorrhage or perforation: Secondary | ICD-10-CM | POA: Diagnosis not present

## 2018-02-26 DIAGNOSIS — D0512 Intraductal carcinoma in situ of left breast: Secondary | ICD-10-CM | POA: Diagnosis not present

## 2018-02-26 DIAGNOSIS — M06041 Rheumatoid arthritis without rheumatoid factor, right hand: Secondary | ICD-10-CM | POA: Diagnosis not present

## 2018-02-28 DIAGNOSIS — Z23 Encounter for immunization: Secondary | ICD-10-CM | POA: Diagnosis not present

## 2018-03-06 ENCOUNTER — Other Ambulatory Visit: Payer: Self-pay | Admitting: Physician Assistant

## 2018-03-06 DIAGNOSIS — M810 Age-related osteoporosis without current pathological fracture: Secondary | ICD-10-CM

## 2018-03-10 ENCOUNTER — Ambulatory Visit
Admission: RE | Admit: 2018-03-10 | Discharge: 2018-03-10 | Disposition: A | Discharge status: Home / Self Care | Payer: Medicare Other | Source: Ambulatory Visit | Attending: Physician Assistant | Admitting: Physician Assistant

## 2018-03-10 DIAGNOSIS — M810 Age-related osteoporosis without current pathological fracture: Secondary | ICD-10-CM

## 2018-03-10 DIAGNOSIS — Z780 Asymptomatic menopausal state: Secondary | ICD-10-CM | POA: Diagnosis not present

## 2018-03-10 DIAGNOSIS — M85851 Other specified disorders of bone density and structure, right thigh: Secondary | ICD-10-CM | POA: Diagnosis not present

## 2018-04-03 DIAGNOSIS — M810 Age-related osteoporosis without current pathological fracture: Secondary | ICD-10-CM | POA: Diagnosis not present

## 2018-04-24 DIAGNOSIS — Z1231 Encounter for screening mammogram for malignant neoplasm of breast: Secondary | ICD-10-CM | POA: Diagnosis not present

## 2018-04-24 DIAGNOSIS — Z853 Personal history of malignant neoplasm of breast: Secondary | ICD-10-CM | POA: Diagnosis not present

## 2018-05-29 DIAGNOSIS — M2550 Pain in unspecified joint: Secondary | ICD-10-CM | POA: Diagnosis not present

## 2018-05-29 DIAGNOSIS — E663 Overweight: Secondary | ICD-10-CM | POA: Diagnosis not present

## 2018-05-29 DIAGNOSIS — Z6829 Body mass index (BMI) 29.0-29.9, adult: Secondary | ICD-10-CM | POA: Diagnosis not present

## 2018-05-29 DIAGNOSIS — M06041 Rheumatoid arthritis without rheumatoid factor, right hand: Secondary | ICD-10-CM | POA: Diagnosis not present

## 2018-05-29 DIAGNOSIS — K269 Duodenal ulcer, unspecified as acute or chronic, without hemorrhage or perforation: Secondary | ICD-10-CM | POA: Diagnosis not present

## 2018-05-29 DIAGNOSIS — Z79899 Other long term (current) drug therapy: Secondary | ICD-10-CM | POA: Diagnosis not present

## 2018-05-29 DIAGNOSIS — D0512 Intraductal carcinoma in situ of left breast: Secondary | ICD-10-CM | POA: Diagnosis not present

## 2018-05-29 DIAGNOSIS — M810 Age-related osteoporosis without current pathological fracture: Secondary | ICD-10-CM | POA: Diagnosis not present

## 2018-09-16 DIAGNOSIS — M810 Age-related osteoporosis without current pathological fracture: Secondary | ICD-10-CM | POA: Diagnosis not present

## 2018-09-16 DIAGNOSIS — M2550 Pain in unspecified joint: Secondary | ICD-10-CM | POA: Diagnosis not present

## 2018-09-16 DIAGNOSIS — Z79899 Other long term (current) drug therapy: Secondary | ICD-10-CM | POA: Diagnosis not present

## 2018-09-16 DIAGNOSIS — K269 Duodenal ulcer, unspecified as acute or chronic, without hemorrhage or perforation: Secondary | ICD-10-CM | POA: Diagnosis not present

## 2018-09-16 DIAGNOSIS — Z7189 Other specified counseling: Secondary | ICD-10-CM | POA: Diagnosis not present

## 2018-09-16 DIAGNOSIS — D0512 Intraductal carcinoma in situ of left breast: Secondary | ICD-10-CM | POA: Diagnosis not present

## 2018-09-16 DIAGNOSIS — M06041 Rheumatoid arthritis without rheumatoid factor, right hand: Secondary | ICD-10-CM | POA: Diagnosis not present

## 2018-09-18 DIAGNOSIS — M06041 Rheumatoid arthritis without rheumatoid factor, right hand: Secondary | ICD-10-CM | POA: Diagnosis not present

## 2018-10-27 DIAGNOSIS — H31001 Unspecified chorioretinal scars, right eye: Secondary | ICD-10-CM | POA: Diagnosis not present

## 2018-10-27 DIAGNOSIS — H43811 Vitreous degeneration, right eye: Secondary | ICD-10-CM | POA: Diagnosis not present

## 2018-10-27 DIAGNOSIS — H532 Diplopia: Secondary | ICD-10-CM | POA: Diagnosis not present

## 2018-10-27 DIAGNOSIS — H52203 Unspecified astigmatism, bilateral: Secondary | ICD-10-CM | POA: Diagnosis not present

## 2018-10-27 DIAGNOSIS — H25011 Cortical age-related cataract, right eye: Secondary | ICD-10-CM | POA: Diagnosis not present

## 2018-11-12 DIAGNOSIS — M810 Age-related osteoporosis without current pathological fracture: Secondary | ICD-10-CM | POA: Diagnosis not present

## 2018-11-12 DIAGNOSIS — I2510 Atherosclerotic heart disease of native coronary artery without angina pectoris: Secondary | ICD-10-CM | POA: Diagnosis not present

## 2018-11-12 DIAGNOSIS — I712 Thoracic aortic aneurysm, without rupture: Secondary | ICD-10-CM | POA: Diagnosis not present

## 2018-11-12 DIAGNOSIS — I700 Atherosclerosis of aorta: Secondary | ICD-10-CM | POA: Diagnosis not present

## 2018-11-12 DIAGNOSIS — E782 Mixed hyperlipidemia: Secondary | ICD-10-CM | POA: Diagnosis not present

## 2018-11-12 DIAGNOSIS — Z0000 Encounter for general adult medical examination without abnormal findings: Secondary | ICD-10-CM | POA: Diagnosis not present

## 2018-11-12 DIAGNOSIS — I10 Essential (primary) hypertension: Secondary | ICD-10-CM | POA: Diagnosis not present

## 2018-11-12 DIAGNOSIS — M069 Rheumatoid arthritis, unspecified: Secondary | ICD-10-CM | POA: Diagnosis not present

## 2018-11-12 DIAGNOSIS — F418 Other specified anxiety disorders: Secondary | ICD-10-CM | POA: Diagnosis not present

## 2018-11-20 ENCOUNTER — Telehealth: Payer: Self-pay | Admitting: Interventional Cardiology

## 2018-11-20 NOTE — Telephone Encounter (Signed)
New Message           Patient wants to come in to be seen, I see a couple of spots that Dr. Irish Lack is the DOD on those days 07/22 08/10 or 08/28, just wanted to ask you first. Let me know or if you want to call her to see if her needs are urgent or not.

## 2018-11-20 NOTE — Telephone Encounter (Signed)
LMTCB

## 2018-12-01 NOTE — Telephone Encounter (Signed)
Called and spoke to patient. Made patient and appointment to come in the office with Dr. Irish Lack on 7/22. Patient answers no to all screening questions below. Patient understands that there are no visitors allowed. Patient understands not to arrive more than 15 minutes prior to the scheduled appointment time and that a mask will be required for the visit.      COVID-19 Pre-Screening Questions:  . In the past 7 to 10 days have you had a cough,  shortness of breath, headache, congestion, fever (100 or greater) body aches, chills, sore throat, or sudden loss of taste or sense of smell? NO . Have you been around anyone with known Covid 19? NO . Have you been around anyone who is awaiting Covid 19 test results in the past 7 to 10 days? NO . Have you been around anyone who has been exposed to Covid 19, or has mentioned symptoms of Covid 19 within the past 7 to 10 days? NO

## 2018-12-02 ENCOUNTER — Telehealth: Payer: Self-pay | Admitting: Interventional Cardiology

## 2018-12-02 NOTE — Telephone Encounter (Signed)
Returned call to pt.  Per Pt she has had sore throat with hoarseness starting about a week ago.  Pt states she has been outside (when it's not too hot) and thinks the symptoms might be related to allergies.  She states she has been checking her temperature and has remained afebrile.  Will discuss with JV in the morning and advise Pt in the morning.  Pt in agreement.  Thanked nurse for call.

## 2018-12-02 NOTE — Telephone Encounter (Signed)
New Message         COVID-19 Pre-Screening Questions:   In the past 7 to 10 days have you had a cough,  shortness of breath, headache, congestion, fever (100 or greater) body aches, chills, sore throat, or sudden loss of taste or sense of smell? Sore throat, and Horse ness   Have you been around anyone with known Covid 19. NO  Have you been around anyone who is awaiting Covid 19 test results in the past 7 to 10 days? NO  Have you been around anyone who has been exposed to Covid 19, or has mentioned symptoms of Covid 19 within the past 7 to 10 days? NO  If you have any concerns/questions about symptoms patients report during screening (either on the phone or at threshold). Contact the provider seeing the patient or DOD for further guidance.  If neither are available contact a member of the leadership team.

## 2018-12-02 NOTE — Progress Notes (Signed)
Cardiology Office Note  Virtual Visit via Video Note   This visit type was conducted due to national recommendations for restrictions regarding the COVID-19 Pandemic (e.g. social distancing) in an effort to limit this patient's exposure and mitigate transmission in our community.  Due to her co-morbid illnesses, this patient is at least at moderate risk for complications without adequate follow up.  This format is felt to be most appropriate for this patient at this time.  All issues noted in this document were discussed and addressed.  A limited physical exam was performed with this format.  Please refer to the patient's chart for her consent to telehealth for [facility].   Patient Location: Home  Patient unable to  Date:  12/03/2018   ID:  Tiffany Kane, DOB [birth year], MRN [id number]  PCP:  Maurice Small, MD    No chief complaint on file.  Thoracic aortic aneurysm  Wt Readings from Last 3 Encounters:  12/03/18 160 lb (72.6 kg)  10/22/17 148 lb (67.1 kg)  02/28/17 144 lb (65.3 kg)       History of Present Illness: Tiffany Kane is a 78 y.o. female  who has had a thoracic aneurysm. It was stable in size by MRA in 2011. Recent MRA in 3/17 showed a diameter of 4.4 cm of the thoracic aorta.  Exercise isLimited by back pain.  She has been walking and lost 9 lbs in late 2018.   She checks BP at home. It runs about 120s/70-80 .No elevated readings to the 150-160 range.  Both arms have been the same. THere has been no significnat difference between the two arms.  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 She has not had a documented blood pressure difference between the 2 arms at any priorcardiologyvisit. No weakness in her arms. No problems holding items. No difference in home BP readings between arms.  Sore throat for the last week.  Some fatigue.  Visit changed to telephone.   No weakness in her arms.  BP has been a  little high at times.  She had a fast pulse on one occasion.  BP 174/91, P 106.  It stayed this way for a while but then dropped on the next check a few days later.    Last check showed equal BP readings in both readings.  No furtehr spikes in BP like the one above.   BP typically has been in the 323F-573U systolic.  Today, BP well controlled.  It has been as low as 202 systolic.   Denies : Chest pain. Dizziness. Leg edema. Nitroglycerin use. Orthopnea. Palpitations. Paroxysmal nocturnal dyspnea. Syncope.     Past Medical History:  Diagnosis Date  . Anxiety   . Arthritis   . Breast cancer ([place]) 2002   DUCTAL IN SITU.Marland Kitchen1999  . Bronchitis   . Compression fracture    T-11  . Coronary artery disease   . Depression   . Depression    situational stress  . Esophageal reflux   . H/O ascites 09/2004  . High cholesterol   . Hyperlipemia   . Hyperlipidemia   . Hypertension   . Osteopenia   . Osteoporosis 03/2006  . Pneumonia   . Thoracic aneurysm    DR. Leonia Reeves  . Thyromegaly     Past Surgical History:  Procedure Laterality Date  . ABDOMINAL HYSTERECTOMY    . BREAST BIOPSY    . CARDIAC CATHETERIZATION  06/1999  . CERVICAL SPINE  SURGERY    . VIDEO BRONCHOSCOPY  05/26/2012   Procedure: VIDEO BRONCHOSCOPY WITH FLUORO;  Surgeon: Collene Gobble, MD;  Location: [facility];  Service: Cardiopulmonary;  Laterality: Bilateral;     Current Outpatient Medications  Medication Sig Dispense Refill  . ALPRAZolam (XANAX) 0.5 MG tablet Take 0.5 mg by mouth daily as needed. For anxiety    . folic acid (FOLVITE) 1 MG tablet Take 1 mg by mouth daily.  1  . methotrexate (RHEUMATREX) 2.5 MG tablet Take 7 tablets by mouth once a week.  2  . metoprolol succinate (TOPROL-XL) 50 MG 24 hr tablet TAKE 1 TABLET BY MOUTH TWICE DAILY WITH OR IMMEDIATELY FOLLOWING A MEAL 180 tablet 3  . simvastatin (ZOCOR) 80 MG tablet Take 40 mg by mouth at bedtime.    . triamterene-hydrochlorothiazide (MAXZIDE-25) 37.5-25  MG per tablet Take 1 tablet by mouth daily.     No current facility-administered medications for this visit.     Allergies:   Demerol [meperidine], Prednisone, and Zoloft [sertraline hcl]    Social History:  The patient  reports that she quit smoking about 26 years ago. Her smoking use included cigarettes. She has a 3.00 pack-year smoking history. She has never used smokeless tobacco. She reports current alcohol use. She reports that she does not use drugs.   Family History:  The patient's family history includes Bone cancer in her father; Breast cancer in her mother; Diabetes in her mother and sister; Heart attack in her brother; Heart disease in her brother; Hypertension in her mother and sister; Prostate cancer in her brother and father.    ROS:  Please see the history of present illness.   Otherwise, review of systems are positive for sore throat.   All other systems are reviewed and negative.    PHYSICAL EXAM: VS:  BP 124/69   Pulse (!) 54   Ht 5\' 1"  (1.549 m)   Wt 160 lb (72.6 kg)   BMI 30.23 kg/m  , BMI Body mass index is 30.23 kg/m. GEN: no acute distress   Respiratory: no shortness of breath  Psych: euthymic mood, full affect  Exam limited by video format EKG:   The ekg ordered today demonstrates NSR, PRWP   Recent Labs: No results found for requested labs within last 8760 hours.   Lipid Panel No results found for: CHOL, TRIG, HDL, CHOLHDL, VLDL, LDLCALC, LDLDIRECT   Other studies Reviewed: Additional studies/ records that were reviewed today with results demonstrating: labs reviewed.   ASSESSMENT AND PLAN:  1. Thoracic aneurysm: 4.2 cm in 2019.  No further imaging ordered. Can check another MRA in a few months.  2. HTN: Controlled for the most part.  Let us know if she has increased readings.  3. Hyperlipidemia: LDL 94. Continue simvastatin.  Ok to take a baby aspirin up to a few times /week.  Did have ulcer in the past.  No significant bleeding.   4. CAD:  Nonobstructive CAD in 2001 by cath.  Negative stress test in 2010.    Current medicines are reviewed at length with the patient today.  The patient concerns regarding her medicines were addressed.  The following changes have been made:  No change  Labs/ tests ordered today include:  No orders of the defined types were placed in this encounter.   Recommend 150 minutes/week of aerobic exercise Low fat, low carb, high fiber diet recommended  Disposition:   FU in 1 year   Signed, Larae Grooms, MD  12/03/2018 11:47 AM    [facility] [address], [place], [place]  [address] Phone: [phone]; Fax: [phone]

## 2018-12-03 ENCOUNTER — Telehealth (INDEPENDENT_AMBULATORY_CARE_PROVIDER_SITE_OTHER): Payer: Medicare Other | Admitting: Interventional Cardiology

## 2018-12-03 ENCOUNTER — Other Ambulatory Visit: Payer: Self-pay

## 2018-12-03 ENCOUNTER — Encounter: Payer: Self-pay | Admitting: Interventional Cardiology

## 2018-12-03 VITALS — BP 124/69 | HR 54 | Ht 61.0 in | Wt 160.0 lb

## 2018-12-03 DIAGNOSIS — E782 Mixed hyperlipidemia: Secondary | ICD-10-CM | POA: Diagnosis not present

## 2018-12-03 DIAGNOSIS — I10 Essential (primary) hypertension: Secondary | ICD-10-CM | POA: Diagnosis not present

## 2018-12-03 DIAGNOSIS — I712 Thoracic aortic aneurysm, without rupture, unspecified: Secondary | ICD-10-CM

## 2018-12-03 NOTE — Addendum Note (Signed)
Addended by: Rodman Key on: 12/03/2018 02:04 PM   Modules accepted: Orders

## 2018-12-03 NOTE — Telephone Encounter (Signed)
Returned call to Pt.  Pt would like to do a phone visit.  Advised Pt her appt time will be the same but she will receive a phone call from Dr. Irish Lack.  Pt indicates understanding.

## 2018-12-03 NOTE — Patient Instructions (Addendum)
Medication Instructions:  No changes If you need a refill on your cardiac medications before your next appointment, please call your pharmacy.   Lab work: none If you have labs (blood work) drawn today and your tests are completely normal, you will receive your results only by: Marland Kitchen MyChart Message (if you have MyChart) OR . A paper copy in the mail If you have any lab test that is abnormal or we need to change your treatment, we will call you to review the results.  Testing/Procedures: Dr. Irish Lack recommends you have an MRA of your chest to evaluate thoracic aorta in September or October. We will contact you to schedule this.  Follow-Up: At [facility], you and your health needs are our priority.  As part of our continuing mission to provide you with exceptional heart care, we have created designated Provider Care Teams.  These Care Teams include your primary Cardiologist (physician) and Advanced Practice Providers (APPs -  Physician Assistants and Nurse Practitioners) who all work together to provide you with the care you need, when you need it. You will need a follow up appointment in 12 months.  Please call our office 2 months in advance to schedule this appointment.  You may see Dr. Irish Lack or one of the following Advanced Practice Providers on your designated Care Team:   [place], PA-C Melina Copa, PA-C . Ermalinda Barrios, PA-C  Any Other Special Instructions Will Be Listed Below (If Applicable).

## 2018-12-17 DIAGNOSIS — Z6830 Body mass index (BMI) 30.0-30.9, adult: Secondary | ICD-10-CM | POA: Diagnosis not present

## 2018-12-17 DIAGNOSIS — M06041 Rheumatoid arthritis without rheumatoid factor, right hand: Secondary | ICD-10-CM | POA: Diagnosis not present

## 2018-12-17 DIAGNOSIS — M2550 Pain in unspecified joint: Secondary | ICD-10-CM | POA: Diagnosis not present

## 2018-12-17 DIAGNOSIS — M810 Age-related osteoporosis without current pathological fracture: Secondary | ICD-10-CM | POA: Diagnosis not present

## 2018-12-17 DIAGNOSIS — E669 Obesity, unspecified: Secondary | ICD-10-CM | POA: Diagnosis not present

## 2018-12-17 DIAGNOSIS — Z7189 Other specified counseling: Secondary | ICD-10-CM | POA: Diagnosis not present

## 2018-12-17 DIAGNOSIS — K269 Duodenal ulcer, unspecified as acute or chronic, without hemorrhage or perforation: Secondary | ICD-10-CM | POA: Diagnosis not present

## 2018-12-17 DIAGNOSIS — Z79899 Other long term (current) drug therapy: Secondary | ICD-10-CM | POA: Diagnosis not present

## 2018-12-17 DIAGNOSIS — D0512 Intraductal carcinoma in situ of left breast: Secondary | ICD-10-CM | POA: Diagnosis not present

## 2019-02-12 ENCOUNTER — Ambulatory Visit (HOSPITAL_COMMUNITY): Payer: Medicare Other

## 2019-02-25 DIAGNOSIS — Z23 Encounter for immunization: Secondary | ICD-10-CM | POA: Diagnosis not present

## 2019-04-02 DIAGNOSIS — M06041 Rheumatoid arthritis without rheumatoid factor, right hand: Secondary | ICD-10-CM | POA: Diagnosis not present

## 2019-06-09 ENCOUNTER — Ambulatory Visit: Payer: Medicare Other

## 2019-06-18 ENCOUNTER — Ambulatory Visit: Payer: Medicare Other | Attending: Internal Medicine

## 2019-06-18 DIAGNOSIS — Z23 Encounter for immunization: Secondary | ICD-10-CM | POA: Insufficient documentation

## 2019-06-18 NOTE — Progress Notes (Signed)
   Covid-19 Vaccination Clinic  Name:  Tiffany Kane    MRN: [id number] DOB: [birth year]  06/18/2019  Tiffany Kane was observed post Covid-19 immunization for 15 minutes without incidence. She was provided with Vaccine Information Sheet and instruction to access the V-Safe system.   Tiffany Kane was instructed to call 911 with any severe reactions post vaccine: Marland Kitchen Difficulty breathing  . Swelling of your face and throat  . A fast heartbeat  . A bad rash all over your body  . Dizziness and weakness    Immunizations Administered    Name Date Dose VIS Date Route   Pfizer COVID-19 Vaccine 06/18/2019 10:38 AM 0.3 mL 04/24/2019 Intramuscular   Manufacturer: [place]   Lot: [id number]   [place]: [id number]

## 2019-06-22 DIAGNOSIS — K269 Duodenal ulcer, unspecified as acute or chronic, without hemorrhage or perforation: Secondary | ICD-10-CM | POA: Diagnosis not present

## 2019-06-22 DIAGNOSIS — M2550 Pain in unspecified joint: Secondary | ICD-10-CM | POA: Diagnosis not present

## 2019-06-22 DIAGNOSIS — M06041 Rheumatoid arthritis without rheumatoid factor, right hand: Secondary | ICD-10-CM | POA: Diagnosis not present

## 2019-06-22 DIAGNOSIS — Z79899 Other long term (current) drug therapy: Secondary | ICD-10-CM | POA: Diagnosis not present

## 2019-06-22 DIAGNOSIS — E663 Overweight: Secondary | ICD-10-CM | POA: Diagnosis not present

## 2019-06-22 DIAGNOSIS — D0512 Intraductal carcinoma in situ of left breast: Secondary | ICD-10-CM | POA: Diagnosis not present

## 2019-06-22 DIAGNOSIS — Z7189 Other specified counseling: Secondary | ICD-10-CM | POA: Diagnosis not present

## 2019-06-22 DIAGNOSIS — M810 Age-related osteoporosis without current pathological fracture: Secondary | ICD-10-CM | POA: Diagnosis not present

## 2019-06-22 DIAGNOSIS — Z6829 Body mass index (BMI) 29.0-29.9, adult: Secondary | ICD-10-CM | POA: Diagnosis not present

## 2019-06-22 DIAGNOSIS — M67912 Unspecified disorder of synovium and tendon, left shoulder: Secondary | ICD-10-CM | POA: Diagnosis not present

## 2019-06-26 ENCOUNTER — Ambulatory Visit: Payer: Medicare Other

## 2019-07-13 ENCOUNTER — Ambulatory Visit: Payer: Medicare Other | Attending: Internal Medicine

## 2019-07-13 DIAGNOSIS — Z23 Encounter for immunization: Secondary | ICD-10-CM

## 2019-07-13 NOTE — Progress Notes (Signed)
   Covid-19 Vaccination Clinic  Name:  Tiffany Kane    MRN: [id number] DOB: 1940/08/25  07/13/2019  [name] was observed post Covid-19 immunization for 15 minutes without incidence. She was provided with Vaccine Information Sheet and instruction to access the V-Safe system.   [name] was instructed to call 911 with any severe reactions post vaccine: Marland Kitchen Difficulty breathing  . Swelling of your face and throat  . A fast heartbeat  . A bad rash all over your body  . Dizziness and weakness    Immunizations Administered    Name Date Dose VIS Date Route   Pfizer COVID-19 Vaccine 07/13/2019  2:37 PM 0.3 mL 04/24/2019 Intramuscular   Manufacturer: [place]   Lot: [id number]   [place]: [id number]

## 2019-09-21 DIAGNOSIS — M06041 Rheumatoid arthritis without rheumatoid factor, right hand: Secondary | ICD-10-CM | POA: Diagnosis not present

## 2019-09-21 DIAGNOSIS — M2550 Pain in unspecified joint: Secondary | ICD-10-CM | POA: Diagnosis not present

## 2019-09-21 DIAGNOSIS — M67912 Unspecified disorder of synovium and tendon, left shoulder: Secondary | ICD-10-CM | POA: Diagnosis not present

## 2019-09-21 DIAGNOSIS — K269 Duodenal ulcer, unspecified as acute or chronic, without hemorrhage or perforation: Secondary | ICD-10-CM | POA: Diagnosis not present

## 2019-09-21 DIAGNOSIS — Z6828 Body mass index (BMI) 28.0-28.9, adult: Secondary | ICD-10-CM | POA: Diagnosis not present

## 2019-09-21 DIAGNOSIS — D0512 Intraductal carcinoma in situ of left breast: Secondary | ICD-10-CM | POA: Diagnosis not present

## 2019-09-21 DIAGNOSIS — Z7189 Other specified counseling: Secondary | ICD-10-CM | POA: Diagnosis not present

## 2019-09-21 DIAGNOSIS — M810 Age-related osteoporosis without current pathological fracture: Secondary | ICD-10-CM | POA: Diagnosis not present

## 2019-09-21 DIAGNOSIS — Z79899 Other long term (current) drug therapy: Secondary | ICD-10-CM | POA: Diagnosis not present

## 2019-09-21 DIAGNOSIS — E663 Overweight: Secondary | ICD-10-CM | POA: Diagnosis not present

## 2019-10-21 DIAGNOSIS — Z1231 Encounter for screening mammogram for malignant neoplasm of breast: Secondary | ICD-10-CM | POA: Diagnosis not present

## 2019-12-08 DIAGNOSIS — J0120 Acute ethmoidal sinusitis, unspecified: Secondary | ICD-10-CM | POA: Diagnosis not present

## 2019-12-08 DIAGNOSIS — R1310 Dysphagia, unspecified: Secondary | ICD-10-CM | POA: Diagnosis not present

## 2019-12-24 ENCOUNTER — Other Ambulatory Visit: Payer: Self-pay | Admitting: Gastroenterology

## 2019-12-24 DIAGNOSIS — R1319 Other dysphagia: Secondary | ICD-10-CM | POA: Diagnosis not present

## 2019-12-24 DIAGNOSIS — R1310 Dysphagia, unspecified: Secondary | ICD-10-CM

## 2019-12-30 ENCOUNTER — Ambulatory Visit
Admission: RE | Admit: 2019-12-30 | Discharge: 2019-12-30 | Disposition: A | Discharge status: Home / Self Care | Payer: Medicare Other | Source: Ambulatory Visit | Attending: Gastroenterology | Admitting: Gastroenterology

## 2019-12-30 DIAGNOSIS — K449 Diaphragmatic hernia without obstruction or gangrene: Secondary | ICD-10-CM | POA: Diagnosis not present

## 2019-12-30 DIAGNOSIS — K224 Dyskinesia of esophagus: Secondary | ICD-10-CM | POA: Diagnosis not present

## 2019-12-30 DIAGNOSIS — R1310 Dysphagia, unspecified: Secondary | ICD-10-CM

## 2019-12-30 DIAGNOSIS — R1319 Other dysphagia: Secondary | ICD-10-CM

## 2019-12-31 DIAGNOSIS — Z6829 Body mass index (BMI) 29.0-29.9, adult: Secondary | ICD-10-CM | POA: Diagnosis not present

## 2019-12-31 DIAGNOSIS — M06041 Rheumatoid arthritis without rheumatoid factor, right hand: Secondary | ICD-10-CM | POA: Diagnosis not present

## 2019-12-31 DIAGNOSIS — M2550 Pain in unspecified joint: Secondary | ICD-10-CM | POA: Diagnosis not present

## 2019-12-31 DIAGNOSIS — Z7189 Other specified counseling: Secondary | ICD-10-CM | POA: Diagnosis not present

## 2019-12-31 DIAGNOSIS — M67912 Unspecified disorder of synovium and tendon, left shoulder: Secondary | ICD-10-CM | POA: Diagnosis not present

## 2019-12-31 DIAGNOSIS — E663 Overweight: Secondary | ICD-10-CM | POA: Diagnosis not present

## 2019-12-31 DIAGNOSIS — D0512 Intraductal carcinoma in situ of left breast: Secondary | ICD-10-CM | POA: Diagnosis not present

## 2019-12-31 DIAGNOSIS — K269 Duodenal ulcer, unspecified as acute or chronic, without hemorrhage or perforation: Secondary | ICD-10-CM | POA: Diagnosis not present

## 2019-12-31 DIAGNOSIS — M810 Age-related osteoporosis without current pathological fracture: Secondary | ICD-10-CM | POA: Diagnosis not present

## 2019-12-31 DIAGNOSIS — Z79899 Other long term (current) drug therapy: Secondary | ICD-10-CM | POA: Diagnosis not present

## 2020-01-06 DIAGNOSIS — R1319 Other dysphagia: Secondary | ICD-10-CM | POA: Diagnosis not present

## 2020-02-08 DIAGNOSIS — Z23 Encounter for immunization: Secondary | ICD-10-CM | POA: Diagnosis not present

## 2020-03-29 DIAGNOSIS — J029 Acute pharyngitis, unspecified: Secondary | ICD-10-CM | POA: Diagnosis not present

## 2020-03-29 DIAGNOSIS — B349 Viral infection, unspecified: Secondary | ICD-10-CM | POA: Diagnosis not present

## 2020-03-29 DIAGNOSIS — R0982 Postnasal drip: Secondary | ICD-10-CM | POA: Diagnosis not present

## 2020-06-28 ENCOUNTER — Other Ambulatory Visit: Payer: Self-pay | Admitting: Physician Assistant

## 2020-06-28 DIAGNOSIS — M810 Age-related osteoporosis without current pathological fracture: Secondary | ICD-10-CM

## 2020-12-01 ENCOUNTER — Ambulatory Visit
Admission: RE | Admit: 2020-12-01 | Discharge: 2020-12-01 | Disposition: A | Discharge status: Home / Self Care | Payer: Medicare Other | Source: Ambulatory Visit | Attending: Physician Assistant | Admitting: Physician Assistant

## 2020-12-01 ENCOUNTER — Other Ambulatory Visit: Payer: Self-pay

## 2020-12-01 DIAGNOSIS — M810 Age-related osteoporosis without current pathological fracture: Secondary | ICD-10-CM

## 2021-02-06 ENCOUNTER — Telehealth: Payer: Self-pay | Admitting: Interventional Cardiology

## 2021-02-06 DIAGNOSIS — I712 Thoracic aortic aneurysm, without rupture, unspecified: Secondary | ICD-10-CM

## 2021-02-06 NOTE — Telephone Encounter (Signed)
Tiffany Kane is calling stating she is wanting to schedule an overdue MRI that she was originally getting yearly. She states the last one was canceled due to insurance purposes, but they are now resolved so she would like it to be setup again.

## 2021-02-09 NOTE — Telephone Encounter (Signed)
OK to order MRA.

## 2021-02-09 NOTE — Telephone Encounter (Signed)
Orders placed.

## 2021-03-01 ENCOUNTER — Ambulatory Visit (HOSPITAL_COMMUNITY)
Admission: RE | Admit: 2021-03-01 | Discharge: 2021-03-01 | Disposition: A | Discharge status: Home / Self Care | Payer: Medicare Other | Source: Ambulatory Visit | Attending: Interventional Cardiology | Admitting: Interventional Cardiology

## 2021-03-01 ENCOUNTER — Other Ambulatory Visit: Payer: Self-pay

## 2021-03-01 DIAGNOSIS — I712 Thoracic aortic aneurysm, without rupture, unspecified: Secondary | ICD-10-CM | POA: Diagnosis not present

## 2021-03-01 MED ORDER — GADOBUTROL 1 MMOL/ML IV SOLN
7.0000 mL | Freq: Once | INTRAVENOUS | Status: AC | PRN
  Administered 2021-03-01: 7 mL via INTRAVENOUS

## 2021-03-03 ENCOUNTER — Telehealth: Payer: Self-pay | Admitting: Interventional Cardiology

## 2021-03-03 ENCOUNTER — Telehealth: Payer: Self-pay

## 2021-03-03 DIAGNOSIS — R911 Solitary pulmonary nodule: Secondary | ICD-10-CM

## 2021-03-03 NOTE — Telephone Encounter (Signed)
[facility] called to ask for referral for patient being that she has not been seen in their office in over 3 years and not considered pt anymore. Aware referral will be placed.

## 2021-03-03 NOTE — Telephone Encounter (Signed)
Spoke with pt and advised per Dr Irish Lack Aorta unchanged at 4.2cm from2019.  Repeat study in one year.  Pt advised lung nodule noted and recommends f/u with PCP.  Pt states she is currently without PCP as she has left the practice.  She request information be sent to Dr Lamonte Sakai with [facility] as she has seen him before and will contact his office for appointment.  Pt thanked Therapist, sports for the call.

## 2021-03-03 NOTE — Telephone Encounter (Signed)
-----   Message from Jettie Booze, MD sent at 03/03/2021 10:59 AM EDT ----- Aorta 4.2 cm.  No change from 2019. Repeat study in one year.  Lung nodule noted.  Please cc PMD regarding noncardiac findings.

## 2021-03-03 NOTE — Telephone Encounter (Signed)
[facility] is calling because they need a referral because the patient hasnt been seen in 3 years

## 2021-03-16 ENCOUNTER — Other Ambulatory Visit: Payer: Self-pay

## 2021-03-16 ENCOUNTER — Ambulatory Visit: Payer: Medicare Other | Admitting: Emergency Medicine

## 2021-03-16 ENCOUNTER — Encounter: Payer: Self-pay | Admitting: Emergency Medicine

## 2021-03-16 VITALS — BP 116/68 | HR 75 | Temp 98.3°F | Ht 60.0 in | Wt 151.2 lb

## 2021-03-16 DIAGNOSIS — J441 Chronic obstructive pulmonary disease with (acute) exacerbation: Secondary | ICD-10-CM | POA: Diagnosis not present

## 2021-03-16 DIAGNOSIS — J8489 Other specified interstitial pulmonary diseases: Secondary | ICD-10-CM

## 2021-03-16 NOTE — Progress Notes (Signed)
Subjective:    Patient ID: Tiffany Kane, female    DOB: [birth year], 80 y.o.   MRN: [id number]  HPI 80 year old woman with a minimal tobacco history, hypertension, breast cancer in situ.  I seen her in the past for abnormal chest x-ray/CT scan of the chest associated with dyspnea and a hacking cough.  She has scattered multi lobar small groundglass opacities on imaging.  She was treated for cryptogenic organizing pneumonia (COP) with prednisone in 2014 and then again in 2016.  Pulmonary function testing with mild restriction. She is referred back now but a new inflammatory focus that was noted on recent MR angio of the chest ordered to follow and aortic aneurysm.  She is not having any new symptoms, she does have some slightly increased exertional SOB with exertion. She can shop, do housework. No cough.   MRI angio 03/01/2021 reviewed by me, shows an irregular nodular signal in the right lower lobe that was not present on her prior 09/2017 or on CT chest 12/2016.  She has some stable linear bronchiectatic change in the hilar regions.   Review of Systems As per HPI  Past Medical History:  Diagnosis Date   Anxiety    Arthritis    Breast cancer ([place]) 2002   DUCTAL IN [facility] Kitchen1999   Bronchitis    Compression fracture    T-11   Coronary artery disease    Depression    Depression    situational stress   Esophageal reflux    H/O ascites 09/2004   High cholesterol    Hyperlipemia    Hyperlipidemia    Hypertension    Osteopenia    Osteoporosis 03/2006   Pneumonia    Thoracic aneurysm    DR. Leonia Reeves   Thyromegaly      Family History  Problem Relation Age of Onset   Breast cancer Mother    Diabetes Mother    Hypertension Mother    Bone cancer Father    Prostate cancer Father    Heart disease Brother    Prostate cancer Brother    Hypertension Sister    Diabetes Sister    Heart attack Brother      Social History   Socioeconomic History   Marital status: Widowed     Spouse name: Not on file   Number of children: 2   Years of education: 35   Highest education level: Not on file  Occupational History   Occupation: RETIRED  Tobacco Use   Smoking status: Former    Packs/day: 0.50    Years: 6.00    Pack years: 3.00    Types: Cigarettes    Quit date: 05/14/1992    Years since quitting: 28.8   Smokeless tobacco: Never  Vaping Use   Vaping Use: Never used  Substance and Sexual Activity   Alcohol use: Yes    Comment: RARELY /once year    Drug use: No   Sexual activity: Never  Other Topics Concern   Not on file  Social History Narrative   Not on file   Social Determinants of Health   Financial Resource Strain: Not on file  Food Insecurity: Not on file  Transportation Needs: Not on file  Physical Activity: Not on file  Stress: Not on file  Social Connections: Not on file  Intimate Partner Violence: Not on file     Allergies  Allergen Reactions   Demerol [Meperidine] Itching    Years ago, causes jittery, feels like going to pass  out    Prednisone     JITTERY FEELING, UNABLE TO FOCUS   Zoloft [Sertraline Hcl]     CAUSES PANIC ATTACKS     Outpatient Medications Prior to Visit  Medication Sig Dispense Refill   ALPRAZolam (XANAX) 0.5 MG tablet Take 0.5 mg by mouth daily as needed. For anxiety     folic acid (FOLVITE) 1 MG tablet Take 1 mg by mouth daily.  1   methotrexate (RHEUMATREX) 2.5 MG tablet Take 7 tablets by mouth once a week.  2   metoprolol succinate (TOPROL-XL) 50 MG 24 hr tablet TAKE 1 TABLET BY MOUTH TWICE DAILY WITH OR IMMEDIATELY FOLLOWING A MEAL 180 tablet 3   simvastatin (ZOCOR) 80 MG tablet Take 40 mg by mouth at bedtime.     triamterene-hydrochlorothiazide (MAXZIDE-25) 37.5-25 MG per tablet Take 1 tablet by mouth daily.     No facility-administered medications prior to visit.         Objective:   Physical Exam Vitals:   03/16/21 0937  BP: 116/68  Pulse: 75  Temp: 98.3 F (36.8 C)  TempSrc: Oral  SpO2: 95%   Weight: 151 lb 3.2 oz (68.6 kg)  Height: 5' (1.524 m)   Gen: Pleasant, overweight elderly woman, well-nourished, in no distress,  normal affect  ENT: No lesions,  mouth clear,  oropharynx clear, no postnasal drip  Neck: No JVD, no stridor  Lungs: No use of accessory muscles, no crackles or wheezing on normal respiration, no wheeze on forced expiration  Cardiovascular: RRR, heart sounds normal, no murmur or gallops, no peripheral edema  Musculoskeletal: No deformities, no cyanosis or clubbing  Neuro: alert, awake, non focal  Skin: Warm, no lesions or rash      Assessment & Plan:   BOOP (bronchiolitis obliterans with organizing pneumonia) ([place]) She has been clinically stable and is currently asymptomatic.  A new focus of inflammatory change identified on MRI angio of the chest.  Need to correlate this with her prior areas of groundglass opacity (most recent CT chest I have is from 2016).  We will perform a repeat high-resolution CT to compare.  If this is a more nodular focus we will either follow or recommend invasive diagnostics depending on size.  She low risk patient for malignancy.  Baltazar Apo, MD, PhD 03/16/2021, 9:50 AM [facility] [phone] or if no answer before 7:00PM call [phone] For any issues after 7:00PM please call eLink [phone]

## 2021-03-16 NOTE — Assessment & Plan Note (Signed)
She has been clinically stable and is currently asymptomatic.  A new focus of inflammatory change identified on MRI angio of the chest.  Need to correlate this with her prior areas of groundglass opacity (most recent CT chest I have is from 2016).  We will perform a repeat high-resolution CT to compare.  If this is a more nodular focus we will either follow or recommend invasive diagnostics depending on size.  She low risk patient for malignancy.

## 2021-03-16 NOTE — Patient Instructions (Signed)
We will perform a high-resolution CT scan of the chest without contrast to evaluate your area of lung inflammation. Follow Dr. Lamonte Sakai next available after your CT so that we can review the results together.

## 2021-03-21 ENCOUNTER — Other Ambulatory Visit: Payer: Self-pay | Admitting: *Deleted

## 2021-03-21 DIAGNOSIS — I712 Thoracic aortic aneurysm, without rupture, unspecified: Secondary | ICD-10-CM

## 2021-04-03 ENCOUNTER — Ambulatory Visit
Admission: RE | Admit: 2021-04-03 | Discharge: 2021-04-03 | Disposition: A | Discharge status: Home / Self Care | Payer: Medicare Other | Source: Ambulatory Visit | Attending: Emergency Medicine | Admitting: Emergency Medicine

## 2021-04-03 DIAGNOSIS — J441 Chronic obstructive pulmonary disease with (acute) exacerbation: Secondary | ICD-10-CM

## 2021-04-17 ENCOUNTER — Telehealth: Payer: Self-pay | Admitting: Emergency Medicine

## 2021-04-17 NOTE — Telephone Encounter (Signed)
Called and spoke with Patient.  Patient requested CT results from 04/03/21.  Advised Dr. Lamonte Sakai was not available today.  Patient stated she was ok waiting for Dr. Lamonte Sakai to result CT when he can.  Message routed to Dr. Lamonte Sakai

## 2021-04-25 NOTE — Telephone Encounter (Signed)
Call made to patient, confirmed DOB. Made aware we will get this message to RB and get back with her. She voiced her frustration for having waited so long. Patient reassured.   RB please advise. Thanks

## 2021-04-25 NOTE — Telephone Encounter (Signed)
Please let her know that I have reviewed her scans. There is some persistent inflammatory change that probably relates to her prior BOOP diagnosis.  Minimal progression if any. I believe we need to follow up in office to review the films and correlate with any symptoms she may be having. We can then decide if it would be helpful to do any further testing.

## 2021-04-26 NOTE — Telephone Encounter (Signed)
Called patient but she did not answer. Left message for her to call back.  

## 2021-05-02 NOTE — Telephone Encounter (Signed)
Called and spoke with pt letting her know the results of CT per RB and stated to her that he wanted her to be scheduled for an appt to further discuss results and she verbalized understanding. Appt has been scheduled for pt with RB. Nothing further needed.

## 2021-05-09 ENCOUNTER — Telehealth: Payer: Self-pay | Admitting: Emergency Medicine

## 2021-05-09 MED ORDER — ALBUTEROL SULFATE HFA 108 (90 BASE) MCG/ACT IN AERS: 2.0000 | INHALATION_SPRAY | RESPIRATORY_TRACT | 0 refills | Status: DC | PRN

## 2021-05-09 NOTE — Telephone Encounter (Signed)
Called and spoke with patient. She is currently scheduled to see RB on 01/26. I did attempt to see if I could move up appt but he did not have any openings.   She stated that her SOB has increased over the past week. She tends to notice the increased SOB with exertion. Her chest also feels tight especially when taking a deep breath. She denied any coughing, fevers or being around anyone who has been sick recently.   She wanted to know if RB would send in an inhaler for her to use until her appt. I did confirm that she is not currently using any inhalers.   Pharmacy is [facility] on [address], can you please advise? Thanks.

## 2021-05-09 NOTE — Telephone Encounter (Signed)
Called and spoke with patient, advised of recommendations per Dr. Lamonte Sakai, she verbalized understanding.  Verified pharmacy and script sent.  Nothing further needed.

## 2021-05-09 NOTE — Telephone Encounter (Signed)
I am ok giving her albuterol to use 2 puffs q4h prn SOB. We can discuss whether she has benefited when she follows up. If she worsens in any way, have her set up an acute OV w APP before I see her

## 2021-05-16 ENCOUNTER — Emergency Department (HOSPITAL_BASED_OUTPATIENT_CLINIC_OR_DEPARTMENT_OTHER): Payer: Medicare Other

## 2021-05-16 ENCOUNTER — Inpatient Hospital Stay (HOSPITAL_BASED_OUTPATIENT_CLINIC_OR_DEPARTMENT_OTHER)
Admission: EM | Admit: 2021-05-16 | Discharge: 2021-05-19 | DRG: 198 | Disposition: A | Discharge status: Home / Self Care | Payer: Medicare Other | Attending: Internal Medicine | Admitting: Internal Medicine

## 2021-05-16 ENCOUNTER — Other Ambulatory Visit: Payer: Self-pay

## 2021-05-16 ENCOUNTER — Encounter (HOSPITAL_BASED_OUTPATIENT_CLINIC_OR_DEPARTMENT_OTHER): Payer: Self-pay | Admitting: Emergency Medicine

## 2021-05-16 DIAGNOSIS — E876 Hypokalemia: Secondary | ICD-10-CM | POA: Diagnosis present

## 2021-05-16 DIAGNOSIS — I712 Thoracic aortic aneurysm, without rupture, unspecified: Secondary | ICD-10-CM | POA: Diagnosis present

## 2021-05-16 DIAGNOSIS — Z888 Allergy status to other drugs, medicaments and biological substances status: Secondary | ICD-10-CM

## 2021-05-16 DIAGNOSIS — Z853 Personal history of malignant neoplasm of breast: Secondary | ICD-10-CM

## 2021-05-16 DIAGNOSIS — J8489 Other specified interstitial pulmonary diseases: Principal | ICD-10-CM | POA: Diagnosis present

## 2021-05-16 DIAGNOSIS — Z87891 Personal history of nicotine dependence: Secondary | ICD-10-CM

## 2021-05-16 DIAGNOSIS — K219 Gastro-esophageal reflux disease without esophagitis: Secondary | ICD-10-CM | POA: Diagnosis present

## 2021-05-16 DIAGNOSIS — Z79899 Other long term (current) drug therapy: Secondary | ICD-10-CM

## 2021-05-16 DIAGNOSIS — J8410 Pulmonary fibrosis, unspecified: Secondary | ICD-10-CM | POA: Diagnosis present

## 2021-05-16 DIAGNOSIS — R630 Anorexia: Secondary | ICD-10-CM | POA: Diagnosis present

## 2021-05-16 DIAGNOSIS — Z8249 Family history of ischemic heart disease and other diseases of the circulatory system: Secondary | ICD-10-CM

## 2021-05-16 DIAGNOSIS — M069 Rheumatoid arthritis, unspecified: Secondary | ICD-10-CM | POA: Diagnosis present

## 2021-05-16 DIAGNOSIS — Z833 Family history of diabetes mellitus: Secondary | ICD-10-CM

## 2021-05-16 DIAGNOSIS — F419 Anxiety disorder, unspecified: Secondary | ICD-10-CM | POA: Diagnosis present

## 2021-05-16 DIAGNOSIS — Z803 Family history of malignant neoplasm of breast: Secondary | ICD-10-CM

## 2021-05-16 DIAGNOSIS — E7800 Pure hypercholesterolemia, unspecified: Secondary | ICD-10-CM | POA: Diagnosis present

## 2021-05-16 DIAGNOSIS — F32A Depression, unspecified: Secondary | ICD-10-CM | POA: Diagnosis present

## 2021-05-16 DIAGNOSIS — R0602 Shortness of breath: Secondary | ICD-10-CM | POA: Diagnosis not present

## 2021-05-16 DIAGNOSIS — R0902 Hypoxemia: Secondary | ICD-10-CM | POA: Diagnosis present

## 2021-05-16 DIAGNOSIS — I10 Essential (primary) hypertension: Secondary | ICD-10-CM | POA: Diagnosis present

## 2021-05-16 DIAGNOSIS — Z885 Allergy status to narcotic agent status: Secondary | ICD-10-CM

## 2021-05-16 DIAGNOSIS — Z8042 Family history of malignant neoplasm of prostate: Secondary | ICD-10-CM

## 2021-05-16 DIAGNOSIS — J849 Interstitial pulmonary disease, unspecified: Secondary | ICD-10-CM

## 2021-05-16 DIAGNOSIS — Z90710 Acquired absence of both cervix and uterus: Secondary | ICD-10-CM

## 2021-05-16 DIAGNOSIS — Z20822 Contact with and (suspected) exposure to covid-19: Secondary | ICD-10-CM | POA: Diagnosis present

## 2021-05-16 DIAGNOSIS — J9601 Acute respiratory failure with hypoxia: Secondary | ICD-10-CM | POA: Diagnosis present

## 2021-05-16 DIAGNOSIS — I2510 Atherosclerotic heart disease of native coronary artery without angina pectoris: Secondary | ICD-10-CM | POA: Diagnosis present

## 2021-05-16 LAB — COMPREHENSIVE METABOLIC PANEL
ALT: 11 U/L (ref 0–44)
AST: 20 U/L (ref 15–41)
Albumin: 3.3 g/dL — ABNORMAL LOW (ref 3.5–5.0)
Alkaline Phosphatase: 67 U/L (ref 38–126)
Anion gap: 14 (ref 5–15)
BUN: 8 mg/dL (ref 8–23)
CO2: 22 mmol/L (ref 22–32)
Calcium: 9 mg/dL (ref 8.9–10.3)
Chloride: 101 mmol/L (ref 98–111)
Creatinine, Ser: 0.74 mg/dL (ref 0.44–1.00)
GFR, Estimated: >60 mL/min (ref >60)
Glucose, Bld: 115 mg/dL — ABNORMAL HIGH (ref 70–99)
Potassium: 2.7 mmol/L — CL (ref 3.5–5.1)
Sodium: 137 mmol/L (ref 135–145)
Total Bilirubin: 0.7 mg/dL (ref 0.3–1.2)
Total Protein: 7.2 g/dL (ref 6.5–8.1)

## 2021-05-16 LAB — CBC WITH DIFFERENTIAL/PLATELET
Abs Immature Granulocytes: 0.04 10*3/uL (ref 0.00–0.07)
Basophils Absolute: 0 10*3/uL (ref 0.0–0.1)
Basophils Relative: 0 %
Eosinophils Absolute: 0.1 10*3/uL (ref 0.0–0.5)
Eosinophils Relative: 2 %
HCT: 34.7 % — ABNORMAL LOW (ref 36.0–46.0)
Hemoglobin: 12.2 g/dL (ref 12.0–15.0)
Immature Granulocytes: 1 %
Lymphocytes Relative: 12 %
Lymphs Abs: 1 10*3/uL (ref 0.7–4.0)
MCH: 30.7 pg (ref 26.0–34.0)
MCHC: 35.2 g/dL (ref 30.0–36.0)
MCV: 87.4 fL (ref 80.0–100.0)
Monocytes Absolute: 0.7 10*3/uL (ref 0.1–1.0)
Monocytes Relative: 8 %
Neutro Abs: 6.8 10*3/uL (ref 1.7–7.7)
Neutrophils Relative %: 77 %
Platelets: 332 10*3/uL (ref 150–400)
RBC: 3.97 MIL/uL (ref 3.87–5.11)
RDW: 12.4 % (ref 11.5–15.5)
WBC: 8.7 10*3/uL (ref 4.0–10.5)
nRBC: 0 % (ref 0.0–0.2)

## 2021-05-16 LAB — BRAIN NATRIURETIC PEPTIDE: B Natriuretic Peptide: 25.8 pg/mL (ref 0.0–100.0)

## 2021-05-16 LAB — TROPONIN I (HIGH SENSITIVITY): Troponin I (High Sensitivity): 3 ng/L (ref <18)

## 2021-05-16 LAB — RESP PANEL BY RT-PCR (FLU A&B, COVID) ARPGX2
Influenza A by PCR: NEGATIVE
Influenza B by PCR: NEGATIVE
SARS Coronavirus 2 by RT PCR: NEGATIVE

## 2021-05-16 MED ORDER — POTASSIUM CHLORIDE CRYS ER 20 MEQ PO TBCR
40.0000 meq | EXTENDED_RELEASE_TABLET | Freq: Once | ORAL | Status: AC
  Administered 2021-05-16: 40 meq via ORAL
  Filled 2021-05-16: qty 2

## 2021-05-16 MED ORDER — ALBUTEROL SULFATE HFA 108 (90 BASE) MCG/ACT IN AERS
4.0000 | INHALATION_SPRAY | Freq: Once | RESPIRATORY_TRACT | Status: AC
Start: 2021-05-16 — End: 2021-05-16
  Administered 2021-05-16: 4 via RESPIRATORY_TRACT
  Filled 2021-05-16: qty 6.7

## 2021-05-16 MED ORDER — IPRATROPIUM BROMIDE HFA 17 MCG/ACT IN AERS
2.0000 | INHALATION_SPRAY | Freq: Once | RESPIRATORY_TRACT | Status: AC
  Administered 2021-05-16: 2 via RESPIRATORY_TRACT
  Filled 2021-05-16: qty 12.9

## 2021-05-16 MED ORDER — IOHEXOL 350 MG/ML SOLN
75.0000 mL | Freq: Once | INTRAVENOUS | Status: AC | PRN
  Administered 2021-05-16: 75 mL via INTRAVENOUS

## 2021-05-16 MED ORDER — POTASSIUM CHLORIDE 10 MEQ/100ML IV SOLN
10.0000 meq | Freq: Once | INTRAVENOUS | Status: AC
  Administered 2021-05-16: 10 meq via INTRAVENOUS
  Filled 2021-05-16: qty 100

## 2021-05-16 MED ORDER — METHYLPREDNISOLONE SODIUM SUCC 125 MG IJ SOLR
125.0000 mg | Freq: Every day | INTRAMUSCULAR | Status: DC
  Administered 2021-05-16: 125 mg via INTRAVENOUS
  Filled 2021-05-16: qty 2

## 2021-05-16 NOTE — ED Notes (Signed)
Report called to [employer].

## 2021-05-16 NOTE — ED Triage Notes (Signed)
Pt c/o SHOB that is increasing x 3wks

## 2021-05-16 NOTE — ED Provider Notes (Signed)
Emergency Medicine Provider Triage Evaluation Note  Tiffany Kane , a 81 y.o. female  was evaluated in triage.  Pt complains of worsening shortness of breath, lack of energy for the last two months. Patient has known thoracic aortic aneursym, was sent for follow up CT chest for a suspicious appearance of lungs on MRI. Patient reports no one would call her with results. CT shows pulmonary fibrosis on my investigation. Patient denies chest pain, abdominal pain. Does feel weak / fatigued -- reports SOB worse with lying down. No hx asthma, COPD. Does have some chills.  Review of Systems  Positive: As above Negative: As above  Physical Exam  BP 95/66 (BP Location: Right Arm)    Pulse 85    Temp 98 F (36.7 C) (Oral)    Resp 20    SpO2 94%  Gen:   Awake, anxious Resp:  Decreased effort, no accessory breath sounds noted MSK:   Moves extremities without difficulty  Other:  No ttp abdomen  Medical Decision Making  Medically screening exam initiated at 12:46 PM.  Appropriate orders placed.  [name] was informed that the remainder of the evaluation will be completed by another provider, this initial triage assessment does not replace that evaluation, and the importance of remaining in the ED until their evaluation is complete.  SOB, thoracic aortic aneurysm hx -- workup initiated   [name] 05/16/21 1248    Gareth Morgan, MD 05/16/21 2235

## 2021-05-16 NOTE — ED Provider Notes (Signed)
[place] EMERGENCY DEPARTMENT Provider Note   CSN: [id number] Arrival date & time: 05/16/21  1228     History  Chief Complaint  Patient presents with   Shortness of Tiffany Kane is a 81 y.o. female.  The history is provided by the spouse.  Shortness of Breath Severity:  Mild Onset quality:  Gradual Duration:  1 week Timing:  Constant Progression:  Worsening Chronicity:  New Context: activity   Relieved by:  Inhaler Worsened by:  Exertion Ineffective treatments:  None tried Associated symptoms: cough   Associated symptoms: no abdominal pain, no chest pain, no claudication, no diaphoresis, no ear pain, no fever, no rash, no sore throat and no vomiting       Home Medications Prior to Admission medications   Medication Sig Start Date End Date Taking? Authorizing Provider  albuterol (VENTOLIN HFA) 108 (90 Base) MCG/ACT inhaler Inhale 2 puffs into the lungs every 4 (four) hours as needed for wheezing or shortness of breath. 05/09/21   Collene Gobble, MD  ALPRAZolam Tiffany Kane) 0.5 MG tablet Take 0.5 mg by mouth daily as needed. For anxiety 01/01/12   [provider]  folic acid (FOLVITE) 1 MG tablet Take 1 mg by mouth daily. 01/07/17   [provider]  methotrexate (RHEUMATREX) 2.5 MG tablet Take 7 tablets by mouth once a week. 10/03/17   [provider]  metoprolol succinate (TOPROL-XL) 50 MG 24 hr tablet TAKE 1 TABLET BY MOUTH TWICE DAILY WITH OR IMMEDIATELY FOLLOWING A MEAL 10/19/16   Jettie Booze, MD  simvastatin (ZOCOR) 80 MG tablet Take 40 mg by mouth at bedtime.    [provider]  triamterene-hydrochlorothiazide (MAXZIDE-25) 37.5-25 MG per tablet Take 1 tablet by mouth daily.    [provider]      Allergies    Demerol [meperidine], Prednisone, and Zoloft [sertraline hcl]    Review of Systems   Review of Systems  Constitutional:  Negative for chills, diaphoresis and fever.  HENT:  Negative  for ear pain and sore throat.   Eyes:  Negative for pain and visual disturbance.  Respiratory:  Positive for cough and shortness of breath.   Cardiovascular:  Negative for chest pain, palpitations and claudication.  Gastrointestinal:  Negative for abdominal pain and vomiting.  Genitourinary:  Negative for dysuria and hematuria.  Musculoskeletal:  Negative for arthralgias and back pain.  Skin:  Negative for color change and rash.  Neurological:  Negative for seizures and syncope.  All other systems reviewed and are negative.  Physical Exam Updated Vital Signs  ED Triage Vitals  Enc Vitals Group     BP 05/16/21 1239 95/66     Pulse Rate 05/16/21 1239 85     Resp 05/16/21 1239 20     Temp 05/16/21 1239 98 F (36.7 C)     Temp Source 05/16/21 1239 Oral     SpO2 05/16/21 1239 94 %     Weight 05/16/21 1247 150 lb (68 kg)     Height 05/16/21 1247 5' (1.524 m)     Head Circumference --      Peak Flow --      Pain Score 05/16/21 1247 0     Pain Loc --      Pain Edu? --      Excl. [name]     Physical Exam Vitals and nursing note reviewed.  Constitutional:      General: She is not in  acute distress.    Appearance: She is well-developed. She is not ill-appearing.  HENT:     Head: Normocephalic and atraumatic.  Eyes:     Conjunctiva/sclera: Conjunctivae normal.  Cardiovascular:     Rate and Rhythm: Normal rate and regular rhythm.     Heart sounds: No murmur heard. Pulmonary:     Effort: Pulmonary effort is normal. No respiratory distress.     Breath sounds: Decreased breath sounds present.  Abdominal:     Palpations: Abdomen is soft.     Tenderness: There is no abdominal tenderness.  Musculoskeletal:        General: No swelling.     Cervical back: Neck supple.  Skin:    General: Skin is warm and dry.     Capillary Refill: Capillary refill takes less than 2 seconds.  Neurological:     Mental Status: She is alert.  Psychiatric:        Mood and Affect: Mood normal.     ED Results / Procedures / Treatments   Labs (all labs ordered are listed, but only abnormal results are displayed) Labs Reviewed  CBC WITH DIFFERENTIAL/PLATELET - Abnormal; Notable for the following components:      Result Value   HCT 34.7 (*)    All other components within normal limits  COMPREHENSIVE METABOLIC PANEL - Abnormal; Notable for the following components:   Potassium 2.7 (*)    Glucose, Bld 115 (*)    Albumin 3.3 (*)    All other components within normal limits  RESP PANEL BY RT-PCR (FLU A&B, COVID) ARPGX2  BRAIN NATRIURETIC PEPTIDE  URINALYSIS, ROUTINE W REFLEX MICROSCOPIC  TROPONIN I (HIGH SENSITIVITY)  TROPONIN I (HIGH SENSITIVITY)    EKG EKG Interpretation  Date/Time:  Tuesday May 16 2021 13:55:35 EST Ventricular Rate:  81 PR Interval:  146 QRS Duration: 80 QT Interval:  382 QTC Calculation: 443 R Axis:   -6 Text Interpretation: Normal sinus rhythm Possible Anterior infarct , age undetermined Confirmed by Lennice Sites [phone]) on 05/16/2021 3:42:21 PM  Radiology DG Chest 2 View  Result Date: 05/16/2021 CLINICAL DATA:  Short of breath. EXAM: CHEST - 2 VIEW COMPARISON:  02/24/2015, chest CT 04/03/2021 FINDINGS: Linear density left perihilar lung unchanged compatible scarring. Patchy airspace disease in the right lung could represent pneumonia. Negative for heart failure or effusion. Surgical clips left paratracheal region unchanged. IMPRESSION: Patchy airspace disease on the right appears progressive since the recent CT. Possible pneumonia. Electronically Signed   By: Franchot Gallo M.D.   On: 05/16/2021 13:14   CT Angio Chest PE W and/or Wo Contrast  Result Date: 05/16/2021 CLINICAL DATA:  Shortness of breath. Progressive shortness of breath for 3 weeks. EXAM: CT ANGIOGRAPHY CHEST WITH CONTRAST TECHNIQUE: Multidetector CT imaging of the chest was performed using the standard protocol during bolus administration of intravenous contrast. Multiplanar CT image  reconstructions and MIPs were obtained to evaluate the vascular anatomy. CONTRAST:  14mL OMNIPAQUE IOHEXOL 350 MG/ML SOLN COMPARISON:  Radiograph earlier today.  Chest CT 04/03/2021 FINDINGS: Cardiovascular: There are no filling defects within the pulmonary arteries to suggest pulmonary embolus. Motion artifact in the lung bases partially obscures basilar assessment in the subsegmental branches. Moderate aortic atherosclerosis. Unchanged 4.2 cm fusiform aneurysmal dilatation of the ascending aorta. No aortic dissection or acute aortic inflammation. Conventional branching pattern from the aortic arch. There are coronary artery calcifications. Heart is overall normal in size. No significant pericardial effusion. Mediastinum/Nodes: 13 mm low right hilar lymph node, comparison  with prior is limited in the absence of prior IV contrast, however appears new. Additional smaller right hilar lymph nodes are not enlarged by size criteria. There are shotty mediastinal lymph nodes. No enlarged left hilar lymph nodes. Moderate-sized hiatal hernia. Heterogeneous right lobe of the thyroid gland. This has been evaluated on previous imaging. (ref: J Am Coll Radiol. 2015 Feb;12(2): 143-50).Thyroid ultrasound 04/30/2011 Lungs/Pleura: Patient has history of chronic lung disease with geographic bilateral ground-glass opacities on prior imaging. Compared to prior exam there is significant increase in patchy ground-glass primarily throughout the right upper, and dependent right middle and right lower lobe. Slight progression in ground-glass opacities in the left lung to a lesser extent. Scarring in the anterior left upper lobe/lingula. No significant pleural effusion. Trachea and central bronchi are patent. Calcified granuloma in the paramediastinal medial left upper lobe, benign. No pulmonary mass. Upper Abdomen: Moderate-sized hiatal hernia. No acute or unexpected upper abdominal findings. Musculoskeletal: Chronic and unchanged  compression fractures of T8 and T11. The bones appear under mineralized. Mild multilevel thoracic spondylosis. Review of the MIP images confirms the above findings. IMPRESSION: 1. No pulmonary embolus. 2. Worsening patchy ground-glass opacities primarily throughout the right lung. Patient has history of chronic lung disease. Differential considerations include progression of underlying chronic lung disease versus superimposed infection/inflammation. 3. Unchanged 4.2 cm fusiform aneurysmal dilatation of the ascending aorta. Recommend annual imaging followup by CTA or MRA. This recommendation follows 2010 ACCF/AHA/AATS/ACR/ASA/SCA/SCAI/SIR/STS/SVM Guidelines for the Diagnosis and Management of Patients with Thoracic Aortic Disease. Circulation. 2010; 121: U765-Y650. Aortic aneurysm NOS (ICD10-I71.9) 4. Prominent low right hilar lymph node is likely reactive, but nonspecific. 5. Moderate-sized hiatal hernia. 6. Chronic and unchanged compression fractures of T8 and T11. 7. Aortic atherosclerosis and coronary artery calcifications. Aortic Atherosclerosis (ICD10-I70.0). Electronically Signed   By: Keith Rake M.D.   On: 05/16/2021 17:29    Procedures Procedures    Medications Ordered in ED Medications  potassium chloride SA (KLOR-CON M) CR tablet 40 mEq (has no administration in time range)  methylPREDNISolone sodium succinate (SOLU-MEDROL) 125 mg/2 mL injection 125 mg (has no administration in time range)  albuterol (VENTOLIN HFA) 108 (90 Base) MCG/ACT inhaler 4 puff (has no administration in time range)  ipratropium (ATROVENT HFA) inhaler 2 puff (has no administration in time range)  potassium chloride 10 mEq in 100 mL IVPB (10 mEq Intravenous New Bag/Given 05/16/21 1629)  iohexol (OMNIPAQUE) 350 MG/ML injection 75 mL (75 mLs Intravenous Contrast Given 05/16/21 1646)    ED Course/ Medical Decision Making/ A&P                           Medical Decision Making  Tiffany Kane is an 81 year old  female with history of hypertension, high cholesterol, depression, anxiety, high cholesterol, CAD, pulmonary fibrosis, rheumatoid arthritis who presents to the ED with shortness of breath.  She has multiple comorbidities making her high risk for this chief complaint.  She has normal vitals except for hypoxia with ambulation in the room.  She gets extremely weak and lightheaded when she ambulates.  She has been having ongoing shortness of breath with exertion for the last several weeks.  Follows with pulmonology outpatient.  Reviewed pulmonology note that shows that she has this history of possibly idiopathic pulmonary fibrosis.  She was started on albuterol inhaler recently but has not felt much improvement.  She denies any fever or sputum production but she has had a cough.  She has no  fever.  She has no obvious wheezing on exam.  She has no history of blood clot.  Overall no chest pain.  EKG was ordered and interpreted by myself shows no ischemic changes.  Lab work was ordered and imaging was ordered with chest x-ray and CT scan of the chest.  Patient given albuterol, Atrovent and IV steroids and will reevaluate after imaging is done.  5:48 PM patient reevaluated and feels a little bit better.  She is now newly on 2 L of oxygen.  There is no significant anemia or electrolyte abnormality or kidney injury.  Have low suspicion for infectious process.  Chest x-ray and CT scan of the chest were done.  I have interpreted the labs and imaging and overall images show worsening of fibrosis process.  No blood clot.  No obvious pneumonia.  Overall suspect that she has worsening of her pulmonary fibrosis and this is an inflammatory process.  Given new hypoxia and her symptoms we will admit for further care as I believe she benefit from IV steroids, breathing treatments and evaluation by PT and OT.  She lives by herself and is very weak currently.  She is a high fall risk.  Patient to be admitted to medicine service for  further care.  This chart was dictated using voice recognition software.  Despite best efforts to proofread,  errors can occur which can change the documentation meaning.          Final Clinical Impression(s) / ED Diagnoses Final diagnoses:  Acute respiratory failure with hypoxia [facility])    Rx / DC Orders ED Discharge Orders     None         Lennice Sites, DO 05/16/21 1749

## 2021-05-17 ENCOUNTER — Encounter (HOSPITAL_COMMUNITY): Payer: Self-pay | Admitting: Internal Medicine

## 2021-05-17 DIAGNOSIS — E7800 Pure hypercholesterolemia, unspecified: Secondary | ICD-10-CM | POA: Diagnosis present

## 2021-05-17 DIAGNOSIS — J8410 Pulmonary fibrosis, unspecified: Secondary | ICD-10-CM | POA: Diagnosis present

## 2021-05-17 DIAGNOSIS — Z20822 Contact with and (suspected) exposure to covid-19: Secondary | ICD-10-CM | POA: Diagnosis present

## 2021-05-17 DIAGNOSIS — Z90710 Acquired absence of both cervix and uterus: Secondary | ICD-10-CM | POA: Diagnosis not present

## 2021-05-17 DIAGNOSIS — E876 Hypokalemia: Secondary | ICD-10-CM | POA: Diagnosis present

## 2021-05-17 DIAGNOSIS — J849 Interstitial pulmonary disease, unspecified: Secondary | ICD-10-CM

## 2021-05-17 DIAGNOSIS — Z833 Family history of diabetes mellitus: Secondary | ICD-10-CM | POA: Diagnosis not present

## 2021-05-17 DIAGNOSIS — Z885 Allergy status to narcotic agent status: Secondary | ICD-10-CM | POA: Diagnosis not present

## 2021-05-17 DIAGNOSIS — J9601 Acute respiratory failure with hypoxia: Secondary | ICD-10-CM | POA: Diagnosis present

## 2021-05-17 DIAGNOSIS — R630 Anorexia: Secondary | ICD-10-CM | POA: Diagnosis present

## 2021-05-17 DIAGNOSIS — R0609 Other forms of dyspnea: Secondary | ICD-10-CM | POA: Diagnosis not present

## 2021-05-17 DIAGNOSIS — I712 Thoracic aortic aneurysm, without rupture, unspecified: Secondary | ICD-10-CM | POA: Diagnosis present

## 2021-05-17 DIAGNOSIS — F419 Anxiety disorder, unspecified: Secondary | ICD-10-CM | POA: Diagnosis present

## 2021-05-17 DIAGNOSIS — J8489 Other specified interstitial pulmonary diseases: Secondary | ICD-10-CM | POA: Diagnosis present

## 2021-05-17 DIAGNOSIS — I2510 Atherosclerotic heart disease of native coronary artery without angina pectoris: Secondary | ICD-10-CM | POA: Diagnosis present

## 2021-05-17 DIAGNOSIS — Z8249 Family history of ischemic heart disease and other diseases of the circulatory system: Secondary | ICD-10-CM | POA: Diagnosis not present

## 2021-05-17 DIAGNOSIS — Z79899 Other long term (current) drug therapy: Secondary | ICD-10-CM | POA: Diagnosis not present

## 2021-05-17 DIAGNOSIS — Z8042 Family history of malignant neoplasm of prostate: Secondary | ICD-10-CM | POA: Diagnosis not present

## 2021-05-17 DIAGNOSIS — Z888 Allergy status to other drugs, medicaments and biological substances status: Secondary | ICD-10-CM | POA: Diagnosis not present

## 2021-05-17 DIAGNOSIS — K219 Gastro-esophageal reflux disease without esophagitis: Secondary | ICD-10-CM | POA: Diagnosis present

## 2021-05-17 DIAGNOSIS — Z87891 Personal history of nicotine dependence: Secondary | ICD-10-CM | POA: Diagnosis not present

## 2021-05-17 DIAGNOSIS — Z803 Family history of malignant neoplasm of breast: Secondary | ICD-10-CM | POA: Diagnosis not present

## 2021-05-17 DIAGNOSIS — F32A Depression, unspecified: Secondary | ICD-10-CM | POA: Diagnosis present

## 2021-05-17 DIAGNOSIS — Z853 Personal history of malignant neoplasm of breast: Secondary | ICD-10-CM | POA: Diagnosis not present

## 2021-05-17 DIAGNOSIS — I10 Essential (primary) hypertension: Secondary | ICD-10-CM | POA: Diagnosis present

## 2021-05-17 DIAGNOSIS — R0602 Shortness of breath: Secondary | ICD-10-CM | POA: Diagnosis present

## 2021-05-17 DIAGNOSIS — M069 Rheumatoid arthritis, unspecified: Secondary | ICD-10-CM | POA: Diagnosis present

## 2021-05-17 LAB — BASIC METABOLIC PANEL
Anion gap: 8 (ref 5–15)
BUN: 8 mg/dL (ref 8–23)
CO2: 25 mmol/L (ref 22–32)
Calcium: 9.1 mg/dL (ref 8.9–10.3)
Chloride: 104 mmol/L (ref 98–111)
Creatinine, Ser: 0.84 mg/dL (ref 0.44–1.00)
GFR, Estimated: >60 mL/min (ref >60)
Glucose, Bld: 176 mg/dL — ABNORMAL HIGH (ref 70–99)
Potassium: 3.9 mmol/L (ref 3.5–5.1)
Sodium: 137 mmol/L (ref 135–145)

## 2021-05-17 LAB — CBC
HCT: 33.7 % — ABNORMAL LOW (ref 36.0–46.0)
Hemoglobin: 11.8 g/dL — ABNORMAL LOW (ref 12.0–15.0)
MCH: 31 pg (ref 26.0–34.0)
MCHC: 35 g/dL (ref 30.0–36.0)
MCV: 88.5 fL (ref 80.0–100.0)
Platelets: 335 10*3/uL (ref 150–400)
RBC: 3.81 MIL/uL — ABNORMAL LOW (ref 3.87–5.11)
RDW: 12.5 % (ref 11.5–15.5)
WBC: 6.1 10*3/uL (ref 4.0–10.5)
nRBC: 0 % (ref 0.0–0.2)

## 2021-05-17 LAB — MAGNESIUM: Magnesium: 2.2 mg/dL (ref 1.7–2.4)

## 2021-05-17 MED ORDER — LACTATED RINGERS IV SOLN: INTRAVENOUS | Status: DC

## 2021-05-17 MED ORDER — ATORVASTATIN CALCIUM 40 MG PO TABS
40.0000 mg | ORAL_TABLET | Freq: Every day | ORAL | Status: DC
  Administered 2021-05-17 – 2021-05-19 (×3): 40 mg via ORAL
  Filled 2021-05-17 (×3): qty 1

## 2021-05-17 MED ORDER — PREDNISONE 20 MG PO TABS
60.0000 mg | ORAL_TABLET | Freq: Every day | ORAL | Status: DC
  Administered 2021-05-18 – 2021-05-19 (×2): 60 mg via ORAL
  Filled 2021-05-17 (×2): qty 3

## 2021-05-17 MED ORDER — ONDANSETRON HCL 4 MG/2ML IJ SOLN
4.0000 mg | Freq: Four times a day (QID) | INTRAMUSCULAR | Status: DC | PRN
  Administered 2021-05-18: 4 mg via INTRAVENOUS
  Filled 2021-05-17: qty 2

## 2021-05-17 MED ORDER — METOPROLOL SUCCINATE ER 50 MG PO TB24
50.0000 mg | ORAL_TABLET | Freq: Every day | ORAL | Status: DC
  Administered 2021-05-17 – 2021-05-19 (×3): 50 mg via ORAL
  Filled 2021-05-17 (×3): qty 1

## 2021-05-17 MED ORDER — ACETAMINOPHEN 650 MG RE SUPP: 650.0000 mg | Freq: Four times a day (QID) | RECTAL | Status: DC | PRN

## 2021-05-17 MED ORDER — PREDNISONE 20 MG PO TABS
40.0000 mg | ORAL_TABLET | Freq: Every day | ORAL | Status: DC
Start: 2021-05-18 — End: 2021-05-17

## 2021-05-17 MED ORDER — ASPIRIN EC 81 MG PO TBEC
81.0000 mg | DELAYED_RELEASE_TABLET | Freq: Every day | ORAL | Status: DC
  Administered 2021-05-17 – 2021-05-19 (×3): 81 mg via ORAL
  Filled 2021-05-17 (×3): qty 1

## 2021-05-17 MED ORDER — ENOXAPARIN SODIUM 40 MG/0.4ML IJ SOSY
40.0000 mg | PREFILLED_SYRINGE | INTRAMUSCULAR | Status: DC
  Administered 2021-05-17 – 2021-05-19 (×3): 40 mg via SUBCUTANEOUS
  Filled 2021-05-17 (×3): qty 0.4

## 2021-05-17 MED ORDER — SENNOSIDES-DOCUSATE SODIUM 8.6-50 MG PO TABS: 1.0000 | ORAL_TABLET | Freq: Every evening | ORAL | Status: DC | PRN

## 2021-05-17 MED ORDER — METHYLPREDNISOLONE SODIUM SUCC 125 MG IJ SOLR
125.0000 mg | Freq: Two times a day (BID) | INTRAMUSCULAR | Status: DC
  Administered 2021-05-17: 125 mg via INTRAVENOUS
  Filled 2021-05-17: qty 2

## 2021-05-17 MED ORDER — ONDANSETRON HCL 4 MG PO TABS
4.0000 mg | ORAL_TABLET | Freq: Four times a day (QID) | ORAL | Status: DC | PRN
  Administered 2021-05-18: 4 mg via ORAL
  Filled 2021-05-17: qty 1

## 2021-05-17 MED ORDER — ALPRAZOLAM 0.5 MG PO TABS
0.5000 mg | ORAL_TABLET | Freq: Every day | ORAL | Status: DC | PRN
  Administered 2021-05-18 – 2021-05-19 (×2): 0.5 mg via ORAL
  Filled 2021-05-17 (×2): qty 1

## 2021-05-17 MED ORDER — ALBUTEROL SULFATE (2.5 MG/3ML) 0.083% IN NEBU: 3.0000 mL | INHALATION_SOLUTION | RESPIRATORY_TRACT | Status: DC | PRN

## 2021-05-17 MED ORDER — IPRATROPIUM-ALBUTEROL 0.5-2.5 (3) MG/3ML IN SOLN: 3.0000 mL | Freq: Four times a day (QID) | RESPIRATORY_TRACT | Status: DC | PRN

## 2021-05-17 MED ORDER — ACETAMINOPHEN 325 MG PO TABS: 650.0000 mg | ORAL_TABLET | Freq: Four times a day (QID) | ORAL | Status: DC | PRN

## 2021-05-17 MED ORDER — TRIAMTERENE-HCTZ 37.5-25 MG PO TABS
1.0000 | ORAL_TABLET | Freq: Every day | ORAL | Status: DC
  Administered 2021-05-17 – 2021-05-19 (×3): 1 via ORAL
  Filled 2021-05-17 (×3): qty 1

## 2021-05-17 NOTE — H&P (Signed)
History and Physical    Tiffany Kane TDV:[id number] DOB: September 08, 1940 DOA: 05/16/2021  PCP: Maurice Small, MD   Patient coming from: Home  Chief Complaint: SOB  HPI: Tiffany Kane is a 81 y.o. female with medical history significant for HTN, pulmonary fibrosis, HLD, CAD, thoracic aorta aneurysm who presents for evaluation of worsening SOB.  She reports that for the last month she has noticed her breathing becoming more labored and she has been more short of breath with normal activities.  Reports over that time she has had increased fatigue and has had decreased ability to do her activities of daily living.  She reports that for the last 3 days she has had sniffing and worsening of her breathing to the point where walking a few steps across the room caused her to be short of breath and she have to stop and rest.  Has seen pulmonology as an outpatient and has had CT and MRIs of her chest which revealed pulmonary fibrosis but she has not been told a specific cause for it.  She reports she used to smoke but quit 1994.  She was prescribed albuterol a few weeks ago which helps a little bit for a short time.  She states she has not had any fever or chills.  She denies any chest pain or palpitations.  She has had a dry cough that has not worsened over the last few weeks.  She has no known sick contacts.   ED Course: [name] is a hemodynamically stable in the emergency room.  She is on 2 L of oxygen nasal cannula to keep O2 sats above 93%.  Lab work revealed a unremarkable CBC, BNP 25.8, troponin 3, sodium 137 potassium 2.7 chloride 101 bicarb 22 creatinine 0.74 BUN 8 calcium 9 glucose 115 albumin 3.3 LFTs normal.  COVID-19 negative.  Influenza A and B are negative.  Hospitalist service was asked to admit for further management and to consult pulmonology for evaluation in the morning  Review of Systems:  General: Denies fever, chills, weight loss, night sweats. Denies change in appetite Eyes:  Denies blurry vision, pain in eye, drainage.  Denies discoloration of eyes. Neck: Denies pain.  Denies swelling.  Denies pain with movement. Cardiovascular: Denies chest pain, palpitations.  Denies edema.  Denies orthopnea Respiratory: Reports shortness of breath with dry cough.  Denies wheezing.  Denies sputum production Gastrointestinal: Denies abdominal pain, swelling.  Denies nausea, vomiting, diarrhea.  Denies melena.  Denies hematemesis. Musculoskeletal: Denies limitation of movement. Denies arthralgias or myalgias. Genitourinary: Denies pelvic pain.  Denies urinary frequency or hesitancy.  Denies dysuria.  Skin: Denies rash.  Denies petechiae, purpura, ecchymosis. Neurological:Denies syncope Denies slurred speech, drooping face.  Denies visual change. Psychiatric: Denies depression, anxiety. Denies hallucinations.  Past Medical History:  Diagnosis Date   Anxiety    Arthritis    Breast cancer ([place]) 2002   DUCTAL IN SITU.[facility] Kitchen1999   Bronchitis    Compression fracture    T-11   Coronary artery disease    Depression    Depression    situational stress   Esophageal reflux    H/O ascites 09/2004   High cholesterol    Hyperlipemia    Hyperlipidemia    Hypertension    Osteopenia    Osteoporosis 03/2006   Pneumonia    Thoracic aneurysm    DR. Leonia Reeves   Thyromegaly     Past Surgical History:  Procedure Laterality Date   ABDOMINAL HYSTERECTOMY  BREAST BIOPSY     CARDIAC CATHETERIZATION  06/15/1999   CATARACT EXTRACTION Right    CERVICAL SPINE SURGERY     VIDEO BRONCHOSCOPY  05/26/2012   Procedure: VIDEO BRONCHOSCOPY WITH FLUORO;  Surgeon: Collene Gobble, MD;  Location: [facility];  Service: Cardiopulmonary;  Laterality: Bilateral;    Social History  reports that she quit smoking about 29 years ago. Her smoking use included cigarettes. She has a 3.00 pack-year smoking history. She has never used smokeless tobacco. She reports current alcohol use. She reports that she does  not use drugs.  Allergies  Allergen Reactions   Demerol [Meperidine] Itching    Years ago, causes jittery, feels like going to pass out    Prednisone     JITTERY FEELING, UNABLE TO FOCUS   Zoloft [Sertraline Hcl]     CAUSES PANIC ATTACKS    Family History  Problem Relation Age of Onset   Breast cancer Mother    Diabetes Mother    Hypertension Mother    Bone cancer Father    Prostate cancer Father    Heart disease Brother    Prostate cancer Brother    Hypertension Sister    Diabetes Sister    Heart attack Brother      Prior to Admission medications   Medication Sig Start Date End Date Taking? Authorizing Provider  albuterol (VENTOLIN HFA) 108 (90 Base) MCG/ACT inhaler Inhale 2 puffs into the lungs every 4 (four) hours as needed for wheezing or shortness of breath. 05/09/21   Collene Gobble, MD  ALPRAZolam Duanne Moron) 0.5 MG tablet Take 0.5 mg by mouth daily as needed. For anxiety 01/01/12   [provider]  folic acid (FOLVITE) 1 MG tablet Take 1 mg by mouth daily. 01/07/17   [provider]  methotrexate (RHEUMATREX) 2.5 MG tablet Take 7 tablets by mouth once a week. 10/03/17   [provider]  metoprolol succinate (TOPROL-XL) 50 MG 24 hr tablet TAKE 1 TABLET BY MOUTH TWICE DAILY WITH OR IMMEDIATELY FOLLOWING A MEAL 10/19/16   Jettie Booze, MD  simvastatin (ZOCOR) 80 MG tablet Take 40 mg by mouth at bedtime.    [provider]  triamterene-hydrochlorothiazide (MAXZIDE-25) 37.5-25 MG per tablet Take 1 tablet by mouth daily.    [provider]    Physical Exam: Vitals:   05/16/21 2130 05/16/21 2200 05/16/21 2230 05/17/21 0100  BP: 125/74 116/64 112/62 133/72  Pulse: 81 73 76 67  Resp: 18 16 16 18   Temp:    98.2 F (36.8 C)  TempSrc:    Oral  SpO2: 94% 98% 97% 98%  Weight:      Height:        Constitutional: NAD, calm, comfortable Vitals:   05/16/21 2130 05/16/21 2200 05/16/21 2230 05/17/21 0100  BP: 125/74 116/64 112/62  133/72  Pulse: 81 73 76 67  Resp: 18 16 16 18   Temp:    98.2 F (36.8 C)  TempSrc:    Oral  SpO2: 94% 98% 97% 98%  Weight:      Height:       General: WDWN, Alert and oriented x3.  Eyes: EOMI, PERRL,  conjunctivae normal.  Sclera nonicteric Neck: Soft, normal range of motion, supple, no masses, no thyromegaly.  Trachea midline Respiratory: Equal but diminished breath sounds. Diffuse rales. no wheezing, no crackles. Normal respiratory effort. Cardiovascular: Regular rate and rhythm, no murmurs / rubs / gallops. No extremity edema. 2+ pedal pulses. Abdomen: Soft, no tenderness,  nondistended, no rebound or guarding.  Bowel sounds normoactive Musculoskeletal: FROM. no cyanosis. Normal muscle tone.  Skin: Warm, dry, intact no rashes, lesions, ulcers. No induration Neurologic: CN 2-12 grossly intact.  Normal speech. No tremor Psychiatric: Normal judgment and insight.  Normal mood.    Labs on Admission: I have personally reviewed following labs and imaging studies  CBC: Recent Labs  Lab 05/16/21 1348  WBC 8.7  NEUTROABS 6.8  HGB 12.2  HCT 34.7*  MCV 87.4  PLT 937    Basic Metabolic Panel: Recent Labs  Lab 05/16/21 1348  NA 137  K 2.7*  CL 101  CO2 22  GLUCOSE 115*  BUN 8  CREATININE 0.74  CALCIUM 9.0    GFR: Estimated Creatinine Clearance: 46.6 mL/min (by C-G formula based on SCr of 0.74 mg/dL).  Liver Function Tests: Recent Labs  Lab 05/16/21 1348  AST 20  ALT 11  ALKPHOS 67  BILITOT 0.7  PROT 7.2  ALBUMIN 3.3*    Urine analysis:    Component Value Date/Time   COLORURINE STRAW (A) 01/09/2017 0550   APPEARANCEUR CLEAR 01/09/2017 0550   LABSPEC 1.018 01/09/2017 0550   PHURINE 5.0 01/09/2017 0550   GLUCOSEU NEGATIVE 01/09/2017 0550   HGBUR NEGATIVE 01/09/2017 0550   BILIRUBINUR NEGATIVE 01/09/2017 0550   KETONESUR NEGATIVE 01/09/2017 0550   PROTEINUR NEGATIVE 01/09/2017 0550   UROBILINOGEN 0.2 08/01/2014 2347   NITRITE NEGATIVE 01/09/2017 0550    LEUKOCYTESUR TRACE (A) 01/09/2017 0550    Radiological Exams on Admission: DG Chest 2 View  Result Date: 05/16/2021 CLINICAL DATA:  Short of breath. EXAM: CHEST - 2 VIEW COMPARISON:  02/24/2015, chest CT 04/03/2021 FINDINGS: Linear density left perihilar lung unchanged compatible scarring. Patchy airspace disease in the right lung could represent pneumonia. Negative for heart failure or effusion. Surgical clips left paratracheal region unchanged. IMPRESSION: Patchy airspace disease on the right appears progressive since the recent CT. Possible pneumonia. Electronically Signed   By: Franchot Gallo M.D.   On: 05/16/2021 13:14   CT Angio Chest PE W and/or Wo Contrast  Result Date: 05/16/2021 CLINICAL DATA:  Shortness of breath. Progressive shortness of breath for 3 weeks. EXAM: CT ANGIOGRAPHY CHEST WITH CONTRAST TECHNIQUE: Multidetector CT imaging of the chest was performed using the standard protocol during bolus administration of intravenous contrast. Multiplanar CT image reconstructions and MIPs were obtained to evaluate the vascular anatomy. CONTRAST:  8mL OMNIPAQUE IOHEXOL 350 MG/ML SOLN COMPARISON:  Radiograph earlier today.  Chest CT 04/03/2021 FINDINGS: Cardiovascular: There are no filling defects within the pulmonary arteries to suggest pulmonary embolus. Motion artifact in the lung bases partially obscures basilar assessment in the subsegmental branches. Moderate aortic atherosclerosis. Unchanged 4.2 cm fusiform aneurysmal dilatation of the ascending aorta. No aortic dissection or acute aortic inflammation. Conventional branching pattern from the aortic arch. There are coronary artery calcifications. Heart is overall normal in size. No significant pericardial effusion. Mediastinum/Nodes: 13 mm low right hilar lymph node, comparison with prior is limited in the absence of prior IV contrast, however appears new. Additional smaller right hilar lymph nodes are not enlarged by size criteria. There are  shotty mediastinal lymph nodes. No enlarged left hilar lymph nodes. Moderate-sized hiatal hernia. Heterogeneous right lobe of the thyroid gland. This has been evaluated on previous imaging. (ref: J Am Coll Radiol. 2015 Feb;12(2): 143-50).Thyroid ultrasound 04/30/2011 Lungs/Pleura: Patient has history of chronic lung disease with geographic bilateral ground-glass opacities on prior imaging. Compared to prior exam there is significant increase in patchy  ground-glass primarily throughout the right upper, and dependent right middle and right lower lobe. Slight progression in ground-glass opacities in the left lung to a lesser extent. Scarring in the anterior left upper lobe/lingula. No significant pleural effusion. Trachea and central bronchi are patent. Calcified granuloma in the paramediastinal medial left upper lobe, benign. No pulmonary mass. Upper Abdomen: Moderate-sized hiatal hernia. No acute or unexpected upper abdominal findings. Musculoskeletal: Chronic and unchanged compression fractures of T8 and T11. The bones appear under mineralized. Mild multilevel thoracic spondylosis. Review of the MIP images confirms the above findings. IMPRESSION: 1. No pulmonary embolus. 2. Worsening patchy ground-glass opacities primarily throughout the right lung. Patient has history of chronic lung disease. Differential considerations include progression of underlying chronic lung disease versus superimposed infection/inflammation. 3. Unchanged 4.2 cm fusiform aneurysmal dilatation of the ascending aorta. Recommend annual imaging followup by CTA or MRA. This recommendation follows 2010 ACCF/AHA/AATS/ACR/ASA/SCA/SCAI/SIR/STS/SVM Guidelines for the Diagnosis and Management of Patients with Thoracic Aortic Disease. Circulation. 2010; 121: R740-C144. Aortic aneurysm NOS (ICD10-I71.9) 4. Prominent low right hilar lymph node is likely reactive, but nonspecific. 5. Moderate-sized hiatal hernia. 6. Chronic and unchanged compression  fractures of T8 and T11. 7. Aortic atherosclerosis and coronary artery calcifications. Aortic Atherosclerosis (ICD10-I70.0). Electronically Signed   By: Keith Rake M.D.   On: 05/16/2021 17:29    EKG: Independently reviewed.  EKG shows normal sinus rhythm with no acute ST elevation or depression.  QTc 443  Assessment/Plan Principal Problem:   Pulmonary fibrosis  Tiffany Kane has pulmonary fibrosis on CT and MRI scans of chest with worsening on images over past few months and worsening of symptoms over past few weeks. Is requiring supplemental oxygen.  Consult pulmonology for further evaluation Solumedrol IV Q 12 hours Duonebs every 6 hrs and albuterol every 4 hours as needed.   Active Problems:   Hypoxia Supplemental oxygen to keep O2 sat 93-96%    Hypokalemia Was given potassium in the ER.  Check magnesium level and if low will need to replace it before further potassium given.    Essential hypertension, benign Continue with metoprolol and maxzide. Monitor BP    Aneurysm of thoracic aorta Stable. Has yearly scan to monitor per patient  DVT prophylaxis: Lovenox for DVT prophylaxis  Code Status:   Full code Family Communication:  Diagnosis and plan discussed with patient.  She verbalized understanding and agrees with plan.  Further recommendations to follow as clinical indicated Disposition Plan:   Patient is from:  Home  Anticipated DC to:  Home  Anticipated DC date:  Anticipate 2 midnight or more stay in the hospital  Time spent on admission: 55 minutes, this includes talking to and examining patient, reviewing labs, reviewing CT scans and imaging reports, preparing orders, discussing with consultant  Admission status:  Inpatient  Yevonne Aline Conlee Sliter MD [facility]  How to contact the [facility] Attending or Consulting provider [place] or covering provider during after hours 7P -7A, for this patient?   Check the care team in [facility] and look for a) attending/consulting [facility]  provider listed and b) the [facility] team listed Log into [email or web address] and use [place]'s universal password to access. If you do not have the password, please contact the hospital operator. Locate the [facility] provider you are looking for under [facility] and page to a number that you can be directly reached. If you still have difficulty reaching the provider, please page the [facility] (Director on Call) for the Hospitalists listed on amion for  assistance.  05/17/2021, 1:38 AM

## 2021-05-17 NOTE — Plan of Care (Signed)

## 2021-05-17 NOTE — Progress Notes (Addendum)
Brief same-day note  Patient is a 81 year old female with history of rheumatoid arthritis, hypertension, pulmonary fibrosis, hyperlipidemia, coronary artery disease who presented from home with complaint of shortness of breath.  She is not on home oxygen.  Lives alone and is dependent on herself for all ADLs.  She was complaining of fatigue for last 3 days.  She follows with Dr. Lamonte Sakai for her pulmonary fibrosis. On presentation, hemodynamically stable but still hypoxic on room air so she had to be put on 2 L of oxygen per minute.  Lab work showed hypokalemia.  COVID, influenza screen test negative.  She was admitted for exacerbation of her pulmonary fibrosis.  She has been started on steroids, bronchodilators.  PCCM consulted and following. Patient seen and examined at the bedside this morning.  Hemodynamically stable during my evaluation.  She was on 2 L.  She was not dyspneic, speaking in full sentences. Examination revealed almost clear lungs, no peripheral edema. Will continue current management for today.  We will try to wean the oxygen and monitor her on room air.  If she continues to need oxygen, will check if she qualifies for home oxygen.  She will be seen by a mobility specialist today. IV steroids changed to oral from tomorrow.  After discussion with PCCM, we planned for continuing the steroids slow taper by 10 mg each week until she follows with pulmonology as an outpatient I called and discussed with the son on phone today and updated our plan

## 2021-05-17 NOTE — Progress Notes (Addendum)
°  Transition of Care [facility]) Screening Note   Patient Details  Name: Tiffany Kane Date of Birth: 10/17/1940   Transition of Care [facility]) CM/SW Contact:    Sharin Mons, RN Phone Number: [phone] 05/17/2021, 1:28 PM   Admitted with SOB, hx of HTN, breast cancer , arthritis, methotrexate exposure for RA, PNA. From home alone. Independent with ADL's, no DME usage. PCP: Maurice Small Transition of Care Department (TOC) has reviewed patient and no TOC needs have been identified at this time. We will continue to monitor patient advancement through interdisciplinary progression rounds. If new patient transition needs arise, please place a TOC consult.  Whitman Hero RN,BSN,CM

## 2021-05-17 NOTE — Consult Note (Signed)
NAME:  Tiffany Kane, MRN:  [id number], DOB:  1941-01-16, LOS: 0 ADMISSION DATE:  05/16/2021, CONSULTATION DATE:  05/17/21 REFERRING MD:  Tawanna Solo, CHIEF COMPLAINT:  dyspnea   History of Present Illness:  80yF history of HTN, breast cancer in situ, arthritis, methotrexate exposure for RA, recurrent episodes of organizing pneumonia following with Dr. Lamonte Sakai, TAA, CAD who was admitted with acute dyspnea found to have new oxygen requirement. She says that over at least the last several weeks she's had gradually increasing dyspnea and malaise, anorexia. No fever. She has had progressive foci of ggo on CT chest over this time frame. Here she was admitted 05/17/21 and started on 2L O2 per [place] and solumedrol 125 IV BID, maintenance LR. She feels better this morning but not quite back to baseline.  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 rate 18, height 5' (1.524 m), weight 68 kg, SpO2 98 %.        Intake/Output Summary (Last 24 hours) at 05/17/2021 8786 Last data filed at 05/16/2021 1924 Gross per 24 hour  Intake 101.07 ml  Output --  Net 101.07 ml   Filed Weights   05/16/21 1247  Weight: 68 kg    Examination: General appearance: 81 y.o., female, NAD, conversant  Eyes: anicteric sclerae; PERRL, tracking appropriately HENT: NCAT; MMM Neck: Trachea midline; no lymphadenopathy, no JVD Lungs: CTAB, no crackles, no wheeze, with normal respiratory effort CV: RRR, no murmur  Abdomen: Soft, non-tender; non-distended, BS present  Extremities: No peripheral edema, warm Skin: Normal  turgor and texture; no rash Psych: Appropriate affect Neuro: Alert and oriented to person and place, no focal deficit   BNP 25  Flu, covid-19 negative  CTA Chest 05/16/21 reviewed by me with more extensive ggo R>L, seems to reflect progression of same process from HRCT last month   Resolved Hospital Problem list     Assessment & Plan:   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 Acute hypoxic respiratory insufficiency - would check respiratory viral panel, echo, ana, CK - would start prednisone 60 mg daily for a week, taper by 10 mg each week, she is to notify us if she deteriorates as dose is decreased and is to resume prior dose at which symptoms better controlled - would recommend holding methotrexate until we can get in touch with her outpatient rheumatologist for consideration of alternative dmard or biologic for her RA on the off chance that it's playing some role in her organizing pneumonia - would stop maintenance fluids - walking oximetry prior to discharge  We will sign off but glad to be reinvolved as condition changes  Best Practice (right click and "Reselect all SmartList Selections" daily)   Per TRH  Labs   CBC: Recent Labs  Lab 05/16/21 1348 05/17/21 0301  WBC 8.7 6.1  NEUTROABS 6.8  --   HGB 12.2 11.8*  HCT 34.7* 33.7*  MCV 87.4 88.5  PLT 332 767    Basic Metabolic Panel: Recent Labs  Lab 05/16/21 1348 05/17/21 0301  NA 137 137  K 2.7* 3.9  CL 101 104  CO2 22 25  GLUCOSE 115* 176*  BUN 8 8  CREATININE 0.74 0.84  CALCIUM 9.0 9.1  MG  --  2.2   GFR: Estimated Creatinine Clearance: 44.4 mL/min (by C-G formula based on SCr of 0.84 mg/dL). Recent Labs  Lab 05/16/21 1348 05/17/21 0301  WBC 8.7 6.1    Liver Function Tests: Recent Labs  Lab 05/16/21 1348  AST 20  ALT 11  ALKPHOS 67  BILITOT 0.7  PROT 7.2  ALBUMIN 3.3*   No results for input(s): LIPASE, AMYLASE in the last 168 hours. No results for input(s): AMMONIA in the last 168  hours.  ABG    Component Value Date/Time   TCO2 26 03/02/2008 1311     Coagulation Profile: No results for input(s): INR, PROTIME in the last 168 hours.  Cardiac Enzymes: No results for input(s): CKTOTAL, CKMB, CKMBINDEX, TROPONINI in the last 168 hours.  HbA1C: No results found for: HGBA1C  CBG: No results for input(s): GLUCAP in the last 168 hours.  Review of Systems:   12 point review of systems is negative except as in HPI  Past Medical History:  She,  has a past medical history of Anxiety, Arthritis, Breast cancer ([place]) (2002), Bronchitis, Compression fracture, Coronary artery disease, Depression, Depression, Esophageal reflux, H/O ascites (09/2004), High cholesterol, Hyperlipemia, Hyperlipidemia, Hypertension, Osteopenia, Osteoporosis (03/2006), Pneumonia, Thoracic aneurysm, and Thyromegaly.   Surgical History:   Past Surgical History:  Procedure Laterality Date   ABDOMINAL HYSTERECTOMY     BREAST BIOPSY     CARDIAC CATHETERIZATION  06/15/1999   CATARACT EXTRACTION Right    CERVICAL SPINE SURGERY     VIDEO BRONCHOSCOPY  05/26/2012   Procedure: VIDEO BRONCHOSCOPY WITH FLUORO;  Surgeon: Collene Gobble, MD;  Location: [facility];  Service: Cardiopulmonary;  Laterality: Bilateral;     Social History:   reports that she quit smoking about 29 years ago. Her smoking use included cigarettes. She has a 3.00 pack-year smoking history. She has never used smokeless tobacco. She reports current alcohol use. She reports that she does not use drugs.   Family History:  Her family history includes Bone cancer in her father; Breast cancer in her mother; Diabetes in her mother and sister; Heart attack in her brother; Heart disease in her brother; Hypertension in her mother and sister; Prostate cancer in her brother and father.   Allergies Allergies  Allergen Reactions   Demerol [Meperidine] Itching    Years ago, causes jittery, feels like going to pass out    Prednisone      JITTERY FEELING, UNABLE TO FOCUS   Zoloft [Sertraline Hcl]     CAUSES PANIC ATTACKS     Home Medications  Prior to Admission medications   Medication Sig Start Date End Date Taking? Authorizing Provider  albuterol (VENTOLIN HFA) 108 (90 Base) MCG/ACT inhaler Inhale 2 puffs into the lungs every 4 (four) hours as needed for wheezing or shortness of breath. 05/09/21   Collene Gobble, MD  ALPRAZolam Duanne Moron) 0.5 MG tablet Take 0.5 mg by mouth daily as needed. For anxiety 01/01/12   [provider]  folic acid (FOLVITE) 1 MG tablet Take 1 mg by mouth daily. 01/07/17   [provider]  methotrexate (RHEUMATREX) 2.5 MG tablet Take 7 tablets by mouth once a week. 10/03/17   [provider]  metoprolol succinate (TOPROL-XL) 50 MG 24 hr tablet TAKE 1 TABLET BY MOUTH TWICE DAILY WITH OR IMMEDIATELY FOLLOWING A MEAL 10/19/16   Jettie Booze, MD  simvastatin (ZOCOR) 80 MG tablet  Take 40 mg by mouth at bedtime.    [provider]  triamterene-hydrochlorothiazide (MAXZIDE-25) 37.5-25 MG per tablet Take 1 tablet by mouth daily.    [provider]         [place]  [place]

## 2021-05-17 NOTE — ED Notes (Signed)
[facility] to bedside to assume care of patient. ?

## 2021-05-18 ENCOUNTER — Inpatient Hospital Stay (HOSPITAL_COMMUNITY): Payer: Medicare Other

## 2021-05-18 DIAGNOSIS — R0609 Other forms of dyspnea: Secondary | ICD-10-CM | POA: Diagnosis not present

## 2021-05-18 DIAGNOSIS — J8410 Pulmonary fibrosis, unspecified: Secondary | ICD-10-CM | POA: Diagnosis not present

## 2021-05-18 LAB — BASIC METABOLIC PANEL
Anion gap: 5 (ref 5–15)
BUN: 12 mg/dL (ref 8–23)
CO2: 26 mmol/L (ref 22–32)
Calcium: 9.1 mg/dL (ref 8.9–10.3)
Chloride: 103 mmol/L (ref 98–111)
Creatinine, Ser: 0.87 mg/dL (ref 0.44–1.00)
GFR, Estimated: >60 mL/min (ref >60)
Glucose, Bld: 110 mg/dL — ABNORMAL HIGH (ref 70–99)
Potassium: 4.3 mmol/L (ref 3.5–5.1)
Sodium: 134 mmol/L — ABNORMAL LOW (ref 135–145)

## 2021-05-18 LAB — RESPIRATORY PANEL BY PCR

## 2021-05-18 LAB — ECHOCARDIOGRAM COMPLETE
AR max vel: 2.29 cm2
AV Peak grad: 5.9 mmHg
Ao pk vel: 1.21 m/s
Area-P 1/2: 3.6 cm2
Calc EF: 58.4 %
Height: 60 in
P 1/2 time: 952 msec
S' Lateral: 2.9 cm
Single Plane A2C EF: 58.1 %
Single Plane A4C EF: 58 %
Weight: 2400 oz

## 2021-05-18 LAB — CK: Total CK: 71 U/L (ref 38–234)

## 2021-05-18 MED ORDER — MELATONIN 3 MG PO TABS
3.0000 mg | ORAL_TABLET | Freq: Once | ORAL | Status: AC
  Administered 2021-05-18: 3 mg via ORAL
  Filled 2021-05-18: qty 1

## 2021-05-18 NOTE — Progress Notes (Signed)
PROGRESS NOTE  Tiffany Kane CBU:[id number] DOB: 10-Feb-1941 DOA: 05/16/2021 PCP: Maurice Small, MD  Brief History   Patient is a 81 year old female with history of rheumatoid arthritis, hypertension, pulmonary fibrosis, hyperlipidemia, coronary artery disease who presented from home with complaint of shortness of breath.  She is not on home oxygen.  Lives alone and is dependent on herself for all ADLs.  She was complaining of fatigue for last 3 days.  She follows with Dr. Lamonte Sakai for her pulmonary fibrosis. On presentation, hemodynamically stable but still hypoxic on room air so she had to be put on 2 L of oxygen per minute.  Lab work showed hypokalemia.  COVID, influenza screen test negative.  She was admitted for exacerbation of her pulmonary fibrosis.  She has been started on steroids, bronchodilators.  PCCM consulted and following. IV steroids changed to oral from tomorrow.  After discussion with PCCM, we planned for continuing the steroids slow taper by 10 mg each week until she follows with pulmonology as an outpatient. Wean O2 as possible. PCCM to contact rheumatology regarding possible discontinuation of methotrexate and starting either an immunologic or biologic.   Consultants  PCCM  Procedures  None  Antibiotics   Anti-infectives (From admission, onward)    None        Subjective  The patient is sitting up at bedside. No new complaints.   Objective   Vitals:  Vitals:   05/18/21 0937 05/18/21 1501  BP:  107/63  Pulse: 64 62  Resp:  17  Temp:  97.8 F (36.6 C)  SpO2:  96%    Exam:  Constitutional:  The patient is awake, alert, and oriented x 3. No acute distress. Respiratory:  No increased work of breathing. No wheezes, rales, or rhonchi No tactile fremitus Cardiovascular:  Regular rate and rhythm No murmurs, ectopy, or gallups. No lateral PMI. No thrills. Abdomen:  Abdomen is soft, non-tender, non-distended No hernias, masses, or  organomegaly Normoactive bowel sounds.  Musculoskeletal:  No cyanosis, clubbing, or edema Skin:  No rashes, lesions, ulcers palpation of skin: no induration or nodules Neurologic:  CN 2-12 intact Sensation all 4 extremities intact Psychiatric:  Mental status Mood, affect appropriate Orientation to person, place, time  judgment and insight appear intact  I have personally reviewed the following:   Today's Data  Vitals  Lab Data  BMP  Micro Data  Respiratory panel negative.  Imaging  CTA chest CXR chest  Cardiology Data  EKG  Other Data    Scheduled Meds:  aspirin EC  81 mg Oral Daily   atorvastatin  40 mg Oral Daily   enoxaparin (LOVENOX) injection  40 mg Subcutaneous Q24H   metoprolol succinate  50 mg Oral Daily   predniSONE  60 mg Oral Q breakfast   triamterene-hydrochlorothiazide  1 tablet Oral Daily   Continuous Infusions:  Principal Problem:   Pulmonary fibrosis (HCC) Active Problems:   Hypokalemia   Aneurysm of thoracic aorta   Essential hypertension, benign   Hypoxia   LOS: 1 day   A & P  Hypoxia Supplemental oxygen to keep O2 sat 93-96%. The patient is currently saturating at 93% on 2 L.   Hypokalemia: Resolved. Magnesium level resolved.  Was given potassium in the ER.    Essential hypertension:  Patient is normotensive on metoprolol and maxzide. Monitor BP   Aneurysm of thoracic aorta: Stable. Has yearly scan to monitor per patient  I have seen and examined this patient myself. I have spent 34  minutes in her evaluation and care.   DVT prophylaxis:      Lovenox for DVT prophylaxis  Code Status:              Full code Family Communication:       Diagnosis and plan discussed with patient.  She verbalized understanding and agrees with plan.  Further recommendations to follow as clinical indicated Disposition Plan:              Patient is from:                        Home             Anticipated DC to:                   Home              Anticipated DC date:               Anticipate 2 midnight or more stay in the hospital   Tiffany Beining, DO [facility] Direct contact: see [email or web address]  7PM-7AM contact night coverage as above 05/18/2021, 5:06 PM  LOS: 1 day

## 2021-05-18 NOTE — Plan of Care (Signed)

## 2021-05-18 NOTE — Progress Notes (Signed)
Physical Therapy Discharge Patient Details Name: Tiffany Kane MRN: [id number] DOB: November 07, 1940 Today's Date: 05/18/2021 Time: 0397-9536 PT Time Calculation (min) (ACUTE ONLY): 24 min  Patient discharged from PT services secondary to  pt has been given and understands all necessary education.  Currently mobilizing with supervision and safe to continue to mobilize with mobility team and [facility] staff .  Please see latest therapy progress note for current level of functioning and progress toward goals.    Progress and discharge plan discussed with patient and/or caregiver: Patient/Caregiver agrees with plan  GP    Jontavious Commons A. Fabrizio Filip, PT, DPT Acute Rehabilitation Services Office: [place] 05/18/2021, 9:41 AM

## 2021-05-18 NOTE — Plan of Care (Signed)

## 2021-05-18 NOTE — Progress Notes (Signed)
Pt had ann episode of anxiety. Meds given. Pt seems bettter. Will monitor.

## 2021-05-18 NOTE — Evaluation (Signed)
Physical Therapy Evaluation Patient Details Name: Tiffany Kane MRN: [id number] DOB: April 20, 1941 Today's Date: 05/18/2021  History of Present Illness  Pt. is 81 yr old F admitted on 1/3 with c/o of increasing SOB over last 3 weeks. CT chest indicates worsening pulmonary fibrosis. PMH: anxiety, arthritis, breast CA, bronchitis, thoracic comp fx, depression, HTN, osteoporosis, thoracic aneurysm  Clinical Impression  Pt was previously independent with ADLs and gait prior to admission for SOB.  Pt is currently requiring CGA with ambulation due to sensation of wooziness/lightheadedness from recent dose of anxiety medication prior to PT eval.  Pt quickly progresses to supervision only during course of eval.  SOB has improved during hospital admission and pt is able to maintain O2 sats between 89%-93% with activity on RA.  Despite 1 episode of LOB upon initially standing, pt is primarily supervision for all activity and is not appropriate for continued skilled PT in acute care.  Pt is safe to mobilize with NSG and mobility team during admission and will be safe to D/C home with assist from son.  Please reconsult should pt have a change in status.     Recommendations for follow up therapy are one component of a multi-disciplinary discharge planning process, led by the attending physician.  Recommendations may be updated based on patient status, additional functional criteria and insurance authorization.  Follow Up Recommendations No PT follow up    Assistance Recommended at Discharge PRN  Patient can return home with the following  A little help with walking and/or transfers    Equipment Recommendations    Recommendations for Other Services       Functional Status Assessment Patient has had a recent decline in their functional status and demonstrates the ability to make significant improvements in function in a reasonable and predictable amount of time.     Precautions / Restrictions  Precautions Precautions: None      Mobility  Bed Mobility Overal bed mobility: Independent             General bed mobility comments: Able to sit up EOB independently without use of bedrails.  O2 sats checked while sitting EOB and noted to be 93%.  Pt. requesting to use bathroom. Patient Response: Cooperative  Transfers Overall transfer level: Needs assistance   Transfers: Sit to/from Stand Sit to Stand: Supervision           General transfer comment: Pt. performs sit > stand and is able to transfer on and off commode with supervision only.    Ambulation/Gait Ambulation/Gait assistance: Min guard Gait Distance (Feet): 160 Feet Assistive device: None Gait Pattern/deviations: Step-through pattern;Staggering left       General Gait Details: Pt. demos 1 episode of staggering to L side when initially starting to amb to bathroom.  Reports feeling "woozy" from taking her anxiety meds 2 hrs earlier.  Also states she has not been eating much.  Able to amb to bathroom with CGA and after use of bathroom able to amb 150 additional ft with CGA only.  No further staggering episodes noted.  Pt. does report slight SOB at ~100 ft mark.  O2 is checked upon returning to room and pt. is noted to be at 89% on RA.  Stairs            Wheelchair Mobility    Modified Rankin (Stroke Patients Only)       Balance Overall balance assessment: Mild deficits observed, not formally tested  Pertinent Vitals/Pain Pain Assessment: 0-10 Pain Score: 0-No pain    Home Living Family/patient expects to be discharged to:: Private residence Living Arrangements: Alone Available Help at Discharge: Family (1 son lives 4 doors down, works during day but will take 1 week off from work to assist; other son lives nearby as well and is retired) Type of Home: Mobile home Home Access: Stairs to enter Entrance Stairs-Rails: Can reach  both Technical brewer of Steps: [place]: One level [place]: None      Prior Function Prior Level of Function : Independent/Modified Independent                     Hand Dominance        Extremity/Trunk Assessment   Upper Extremity Assessment Upper Extremity Assessment: Overall WFL for tasks assessed    Lower Extremity Assessment Lower Extremity Assessment: Overall WFL for tasks assessed    Cervical / Trunk Assessment Cervical / Trunk Assessment: Normal  Communication   Communication: No difficulties  Cognition Arousal/Alertness: Awake/alert Behavior During Therapy: WFL for tasks assessed/performed Overall Cognitive Status: Within Functional Limits for tasks assessed                                 General Comments: Pt. is supine in bed when PT arrives.  States that she is feeling better overall.  Was able to amb in halls yesterday with assist, did have minor SOB with acitivity.  Alert and oriented x 3.  Looking forward to D/Cing home.        General Comments General comments (skin integrity, edema, etc.): Pt. is educated on PLB and demos understanding.  O2 sats return to 91% at rest and with PLB technique.  Pt. is also educated to have someone at home with her initally upon D/C due to feeling of lightheadedness/wooziness with initial ambulation.    Exercises     Assessment/Plan    PT Assessment Patient does not need any further PT services (Pt. should continue to mobilize with NSG and mobility specialists.  No further skilled PT required.)  PT Problem List         PT Treatment Interventions      PT Goals (Current goals can be found in the Care Plan section)  Acute Rehab PT Goals Patient Stated Goal: Pt's goal is to return to independence. PT Goal Formulation: With patient Time For Goal Achievement: 06/01/21 Potential to Achieve Goals: Good    Frequency       Co-evaluation               AM-PAC PT "6  Clicks" Mobility  Outcome Measure Help needed turning from your back to your side while in a flat bed without using bedrails?: None Help needed moving from lying on your back to sitting on the side of a flat bed without using bedrails?: None Help needed moving to and from a bed to a chair (including a wheelchair)?: A Little Help needed standing up from a chair using your arms (e.g., wheelchair or bedside chair)?: A Little Help needed to walk in hospital room?: A Little Help needed climbing 3-5 steps with a railing? : A Little 6 Click Score: 20    End of Session Equipment Utilized During Treatment: Gait belt Activity Tolerance: Patient tolerated treatment well     PT Visit Diagnosis: Unsteadiness on feet (R26.81)    Time: 0093-8182 PT  Time Calculation (min) (ACUTE ONLY): 24 min   Charges:   PT Evaluation $PT Eval Low Complexity: 1 Low PT Treatments $Therapeutic Activity: 8-22 mins        Silus Lanzo A. Massiel Stipp, PT, DPT Acute Rehabilitation Services Office: [place] 05/18/2021, 9:36 AM

## 2021-05-19 DIAGNOSIS — J849 Interstitial pulmonary disease, unspecified: Secondary | ICD-10-CM

## 2021-05-19 LAB — BASIC METABOLIC PANEL
Anion gap: 7 (ref 5–15)
BUN: 15 mg/dL (ref 8–23)
CO2: 26 mmol/L (ref 22–32)
Calcium: 8.9 mg/dL (ref 8.9–10.3)
Chloride: 102 mmol/L (ref 98–111)
Creatinine, Ser: 0.85 mg/dL (ref 0.44–1.00)
GFR, Estimated: >60 mL/min (ref >60)
Glucose, Bld: 115 mg/dL — ABNORMAL HIGH (ref 70–99)
Potassium: 4.3 mmol/L (ref 3.5–5.1)
Sodium: 135 mmol/L (ref 135–145)

## 2021-05-19 LAB — CBC WITH DIFFERENTIAL/PLATELET
Abs Immature Granulocytes: 0.05 10*3/uL (ref 0.00–0.07)
Basophils Absolute: 0 10*3/uL (ref 0.0–0.1)
Basophils Relative: 0 %
Eosinophils Absolute: 0 10*3/uL (ref 0.0–0.5)
Eosinophils Relative: 0 %
HCT: 32.7 % — ABNORMAL LOW (ref 36.0–46.0)
Hemoglobin: 10.9 g/dL — ABNORMAL LOW (ref 12.0–15.0)
Immature Granulocytes: 1 %
Lymphocytes Relative: 15 %
Lymphs Abs: 1.3 10*3/uL (ref 0.7–4.0)
MCH: 30.3 pg (ref 26.0–34.0)
MCHC: 33.3 g/dL (ref 30.0–36.0)
MCV: 90.8 fL (ref 80.0–100.0)
Monocytes Absolute: 0.7 10*3/uL (ref 0.1–1.0)
Monocytes Relative: 8 %
Neutro Abs: 6.5 10*3/uL (ref 1.7–7.7)
Neutrophils Relative %: 76 %
Platelets: 353 10*3/uL (ref 150–400)
RBC: 3.6 MIL/uL — ABNORMAL LOW (ref 3.87–5.11)
RDW: 12.3 % (ref 11.5–15.5)
WBC: 8.5 10*3/uL (ref 4.0–10.5)
nRBC: 0 % (ref 0.0–0.2)

## 2021-05-19 LAB — ANA W/REFLEX IF POSITIVE: Anti Nuclear Antibody (ANA): NEGATIVE

## 2021-05-19 MED ORDER — PREDNISONE 20 MG PO TABS: ORAL_TABLET | ORAL | 0 refills | Status: AC

## 2021-05-19 MED ORDER — ALPRAZOLAM 0.5 MG PO TABS: 0.5000 mg | ORAL_TABLET | Freq: Every day | ORAL | 0 refills | Status: DC | PRN

## 2021-05-19 MED ORDER — IPRATROPIUM-ALBUTEROL 0.5-2.5 (3) MG/3ML IN SOLN: 3.0000 mL | Freq: Four times a day (QID) | RESPIRATORY_TRACT | 0 refills | Status: DC

## 2021-05-19 MED ORDER — ASPIRIN 81 MG PO TBEC: 81.0000 mg | DELAYED_RELEASE_TABLET | Freq: Every day | ORAL | 11 refills | Status: AC

## 2021-05-19 NOTE — Progress Notes (Signed)
Mobility Specialist Criteria Algorithm Info.   05/19/21 1115  Mobility  Activity Ambulated in room;Dangled on edge of bed (Declined hallway ambulation)  Range of Motion/Exercises Active;All extremities  Level of Assistance Standby assist, set-up cues, supervision of patient - no hands on  Assistive Device None  Distance Ambulated (ft) 30 ft  Mobility Ambulated with assistance in room  Mobility Response Tolerated well  Mobility performed by Mobility specialist  Bed Position Semi-fowlers   Patient received dangling EOB agreeable to participate. Declines hallway ambulation at this time. Patient ambulated in room with supervision and steady gait. Tolerated ambulation well without complaint or incident. Was left dangling EOB with all needs met.   05/19/2021 3:07 PM

## 2021-05-19 NOTE — Progress Notes (Signed)
Discharge papers given to the patient and she stated understanding. Questions and concerns answered. VS checked and within normal limit. Cardiac monitor and IV discontinued.

## 2021-05-19 NOTE — Progress Notes (Signed)
Patient discharged via wheel chair at this time. Patient hemodynamically stable during discharge.  °

## 2021-05-19 NOTE — Care Management Important Message (Signed)
Important Message  Patient Details  Name: Tiffany Kane MRN: [id number] Date of Birth: [birth year]   Medicare Important Message Given:  Yes     Hannah Beat 05/19/2021, 11:52 AM

## 2021-05-19 NOTE — Plan of Care (Signed)

## 2021-05-22 ENCOUNTER — Telehealth: Payer: Self-pay | Admitting: Emergency Medicine

## 2021-05-22 DIAGNOSIS — J441 Chronic obstructive pulmonary disease with (acute) exacerbation: Secondary | ICD-10-CM

## 2021-05-22 NOTE — Telephone Encounter (Signed)
Patient was released from hospital on Friday evening. She has some questions about some medications she was prescribed: Ipratropium-Albuterol. She doesn't have a breathing machine. She would like for someone to give her a call and advise on what she needs to do

## 2021-05-22 NOTE — Telephone Encounter (Signed)
Call made to patient, confirmed DOB. Patient was recently D/C from Hospital and was given a nebulizer medication but not a nebulizer machine. She is wanting [place] to send in order for nebulizer machine. She also wants to know if she really needs the nebulizer at all or was it just useful for her hospital stay.  RB please advise. Thanks

## 2021-05-22 NOTE — Telephone Encounter (Signed)
Call made to patient, made aware of recommendations. Voiced understanding. She is willing to try. Order placed.   Nothing further needed at this time.

## 2021-05-22 NOTE — Telephone Encounter (Signed)
If she believes that she benefited from the DuoNeb, then I would be ok continuing it q6h prn SOB.  If so, go ahead and order the neb machine

## 2021-06-08 ENCOUNTER — Other Ambulatory Visit: Payer: Self-pay

## 2021-06-08 ENCOUNTER — Encounter: Payer: Self-pay | Admitting: Emergency Medicine

## 2021-06-08 ENCOUNTER — Ambulatory Visit: Payer: Medicare Other | Admitting: Emergency Medicine

## 2021-06-08 DIAGNOSIS — J849 Interstitial pulmonary disease, unspecified: Secondary | ICD-10-CM

## 2021-06-08 DIAGNOSIS — J189 Pneumonia, unspecified organism: Secondary | ICD-10-CM | POA: Diagnosis not present

## 2021-06-08 NOTE — Assessment & Plan Note (Signed)
° °  Please continue tapering down your prednisone to 10 mg daily as planned.  We will stay on this dose until you follow-up You probably will not be able to go back on methotrexate.  We will discuss her case with rheumatology to determine the next steps. We will repeat your high-resolution CT scan of the chest in early March 2023. Follow Dr. Lamonte Sakai in early March after your CT so we can review the results together.

## 2021-06-08 NOTE — Progress Notes (Signed)
Subjective:    Patient ID: Tiffany Kane, female    DOB: 22-Apr-1941, 81 y.o.   MRN: [id number]  HPI 81 year old woman with a minimal tobacco history, hypertension, breast cancer in situ.  I seen her in the past for abnormal chest x-ray/CT scan of the chest associated with dyspnea and a hacking cough.  She has scattered multi lobar small groundglass opacities on imaging.  She was treated for cryptogenic organizing pneumonia (COP) with prednisone in 2014 and then again in 2016.  Pulmonary function testing with mild restriction. She is referred back now but a new inflammatory focus that was noted on recent MR angio of the chest ordered to follow and aortic aneurysm.  She is not having any new symptoms, she does have some slightly increased exertional SOB with exertion. She can shop, do housework. No cough.   MRI angio 03/01/2021 reviewed by me, shows an irregular nodular signal in the right lower lobe that was not present on her prior 09/2017 or on CT chest 12/2016.  She has some stable linear bronchiectatic change in the hilar regions.   ROV 06/08/21 --81 year old woman with history of hypertension, breast cancer, rheumatoid arthritis on MTX for years, minimal tobacco history, cryptogenic organizing pneumonia 2014 and 2016.  Pulmonary function testing that showed mild restriction.  I saw her in November after new inflammatory focus was noted on MRI angio of the chest to evaluate aortic aneurysm.  High-resolution CT scan of the chest as below.  She was admitted with respiratory failure 05/16/2021 with progression of disease and was treated with corticosteroids. She was having progressive dyspnea, fatigue. She is currently off MTX, is on Pred 30mg  qd. Her breathing is improved. Followed by Dr Trudie Reed, has seen Marella Chimes, Leafy Kindle  High-resolution CT chest 04/03/2021 reviewed by me, shows extensive groundglass airspace opacity with fibrotic change and architectural distortion especially perihilar  left lung, some interval progression compared with 01/09/2017.  CT-PA 05/16/2021 reviewed by me, shows increased geographic bilateral groundglass opacity especially on the right.  No pulmonary embolism   Review of Systems As per HPI  Past Medical History:  Diagnosis Date   Anxiety    Arthritis    Breast cancer ([place]) 2002   DUCTAL IN [facility]1999   Bronchitis    Compression fracture    T-11   Coronary artery disease    Depression    Depression    situational stress   Esophageal reflux    H/O ascites 09/2004   High cholesterol    Hyperlipemia    Hyperlipidemia    Hypertension    Osteopenia    Osteoporosis 03/2006   Pneumonia    Thoracic aneurysm    DR. Leonia Reeves   Thyromegaly      Family History  Problem Relation Age of Onset   Breast cancer Mother    Diabetes Mother    Hypertension Mother    Bone cancer Father    Prostate cancer Father    Heart disease Brother    Prostate cancer Brother    Hypertension Sister    Diabetes Sister    Heart attack Brother      Social History   Socioeconomic History   Marital status: Widowed    Spouse name: Not on file   Number of children: 2   Years of education: 19   Highest education level: Not on file  Occupational History   Occupation: RETIRED  Tobacco Use   Smoking status: Former    Packs/day: 0.50  Years: 6.00    Pack years: 3.00    Types: Cigarettes    Quit date: 05/14/1992    Years since quitting: 29.0   Smokeless tobacco: Never  Vaping Use   Vaping Use: Never used  Substance and Sexual Activity   Alcohol use: Yes    Comment: RARELY /once year    Drug use: No   Sexual activity: Never  Other Topics Concern   Not on file  Social History Narrative   Not on file   Social Determinants of Health   Financial Resource Strain: Not on file  Food Insecurity: Not on file  Transportation Needs: Not on file  Physical Activity: Not on file  Stress: Not on file  Social Connections: Not on file  Intimate Partner  Violence: Not on file     Allergies  Allergen Reactions   Demerol [Meperidine] Itching    Years ago, causes jittery, feels like going to pass out    Prednisone     JITTERY FEELING, UNABLE TO FOCUS   Zoloft [Sertraline Hcl]     CAUSES PANIC ATTACKS     Outpatient Medications Prior to Visit  Medication Sig Dispense Refill   albuterol (VENTOLIN HFA) 108 (90 Base) MCG/ACT inhaler Inhale 2 puffs into the lungs every 4 (four) hours as needed for wheezing or shortness of breath. 18 g 0   ALPRAZolam (XANAX) 0.25 MG tablet Take 0.25 mg by mouth 2 (two) times daily as needed for anxiety (axiety as well as panic attacks).     ALPRAZolam (XANAX) 0.5 MG tablet Take 1 tablet (0.5 mg total) by mouth daily as needed for anxiety. 10 tablet 0   aspirin EC 81 MG EC tablet Take 1 tablet (81 mg total) by mouth daily. Swallow whole. 30 tablet 11   escitalopram (LEXAPRO) 10 MG tablet Take 10 mg by mouth daily.     folic acid (FOLVITE) 1 MG tablet Take 1 mg by mouth daily.  1   ipratropium-albuterol (DUONEB) 0.5-2.5 (3) MG/3ML SOLN Take 3 mLs by nebulization every 6 (six) hours. 360 mL 0   metoprolol succinate (TOPROL-XL) 50 MG 24 hr tablet TAKE 1 TABLET BY MOUTH TWICE DAILY WITH OR IMMEDIATELY FOLLOWING A MEAL (Patient taking differently: Take 50 mg by mouth in the morning and at bedtime.) 180 tablet 3   Multiple Vitamins-Minerals (ZINC PO) Take 1 tablet by mouth daily.     predniSONE (DELTASONE) 20 MG tablet Take 3 tablets (60 mg total) by mouth daily with breakfast for 5 days, THEN 2.5 tablets (50 mg total) daily with breakfast for 7 days, THEN 2 tablets (40 mg total) daily with breakfast for 7 days, THEN 1.5 tablets (30 mg total) daily with breakfast for 7 days, THEN 1 tablet (20 mg total) daily with breakfast for 7 days, THEN 0.5 tablets (10 mg total) daily with breakfast. 79 tablet 0   simvastatin (ZOCOR) 80 MG tablet Take 40 mg by mouth at bedtime.     triamterene-hydrochlorothiazide (MAXZIDE-25) 37.5-25 MG  per tablet Take 1 tablet by mouth daily.     No facility-administered medications prior to visit.         Objective:   Physical Exam Vitals:   06/08/21 1103  BP: 116/66  Pulse: (!) 55  Temp: 97.7 F (36.5 C)  TempSrc: Oral  SpO2: 99%  Weight: 149 lb 12.8 oz (67.9 kg)  Height: 5' (1.524 m)   Gen: Pleasant, overweight elderly woman, well-nourished, in no distress,  normal affect  ENT: No  lesions,  mouth clear,  oropharynx clear, no postnasal drip  Neck: No JVD, no stridor  Lungs: No use of accessory muscles, no crackles or wheezing on normal respiration, no wheeze on forced expiration  Cardiovascular: RRR, heart sounds normal, no murmur or gallops, no peripheral edema  Musculoskeletal: No deformities, no cyanosis or clubbing  Neuro: alert, awake, non focal  Skin: Warm, no lesions or rash      Assessment & Plan:   ILD (interstitial lung disease) ([place])   Please continue tapering down your prednisone to 10 mg daily as planned.  We will stay on this dose until you follow-up You probably will not be able to go back on methotrexate.  We will discuss her case with rheumatology to determine the next steps. We will repeat your high-resolution CT scan of the chest in early March 2023. Follow Dr. Lamonte Sakai in early March after your CT so we can review the results together.  Pneumonitis Acute groundglass and progression of inflammatory change superimposed on her known ILD (likely scar from prior acute episode similar to this that were characterized as COP).  Suspect this is either due to rheumatoid or methotrexate.  The methotrexate has been stopped.  She is clinically improved with corticosteroids and is currently weaning with a goal to get down to 10 mg daily.  I will plan to repeat her CT scan of the chest after prolonged therapy to look for clearance of her groundglass, assess the degree of her ILD.  Suspect she will not be able to go back on methotrexate.  I have to talk to  rheumatology to discuss an alternative.  Time spent 42 minutes  Baltazar Apo, MD, PhD 06/08/2021, 5:09 PM  Pulmonary and Critical Care [phone] or if no answer before 7:00PM call [phone] For any issues after 7:00PM please call eLink [phone]

## 2021-06-08 NOTE — Patient Instructions (Signed)
Please continue tapering down your prednisone to 10 mg daily as planned.  We will stay on this dose until you follow-up You probably will not be able to go back on methotrexate.  We will discuss her case with rheumatology to determine the next steps. We will repeat your high-resolution CT scan of the chest in early March 2023. Follow Dr. Lamonte Sakai in early March after your CT so we can review the results together.

## 2021-06-08 NOTE — Assessment & Plan Note (Signed)
Acute groundglass and progression of inflammatory change superimposed on her known ILD (likely scar from prior acute episode similar to this that were characterized as COP).  Suspect this is either due to rheumatoid or methotrexate.  The methotrexate has been stopped.  She is clinically improved with corticosteroids and is currently weaning with a goal to get down to 10 mg daily.  I will plan to repeat her CT scan of the chest after prolonged therapy to look for clearance of her groundglass, assess the degree of her ILD.  Suspect she will not be able to go back on methotrexate.  I have to talk to rheumatology to discuss an alternative.

## 2021-06-12 ENCOUNTER — Ambulatory Visit: Payer: Medicare Other | Admitting: Interventional Cardiology

## 2021-06-16 DIAGNOSIS — R918 Other nonspecific abnormal finding of lung field: Secondary | ICD-10-CM | POA: Diagnosis not present

## 2021-06-25 NOTE — Discharge Summary (Signed)
Physician Discharge Summary   Patient: Tiffany Kane MRN: [id number] DOB: 11/20/1940  Admit date:     05/16/2021  Discharge date: 05/19/2021  Discharge Physician: Karie Kirks   PCP: Maurice Small, MD   Recommendations at discharge:    Discharge to home. Follow up with PCP in 7-10 days. Rheumatology will be calling you to set up an appointment to discuss treatment for your RA. Follow prednisone taper. Follow up with pulmonology in 2-4 weeks.  Discharge Diagnoses: Principal Problem:   ILD (interstitial lung disease) ([place]) Active Problems:   Hypokalemia   Aneurysm of thoracic aorta   Essential hypertension, benign   Hypoxia  Resolved Problems:   * No resolved hospital problems. [facility] Course: Patient is a 81 year old female with history of rheumatoid arthritis, hypertension, pulmonary fibrosis, hyperlipidemia, coronary artery disease who presented from home with complaint of shortness of breath.  She is not on home oxygen.  Lives alone and is dependent on herself for all ADLs.  She was complaining of fatigue for last 3 days.  She follows with Dr. Lamonte Sakai for her pulmonary fibrosis. On presentation, hemodynamically stable but still hypoxic on room air so she had to be put on 2 L of oxygen per minute.  Lab work showed hypokalemia.  COVID, influenza screen test negative.  She was admitted for exacerbation of her pulmonary fibrosis.  She has been started on steroids, bronchodilators.  PCCM consulted and following. IV steroids changed to oral from tomorrow.  After discussion with PCCM, we planned for continuing the steroids slow taper by 10 mg each week until she follows with pulmonology as an outpatient. Wean O2 as possible. PCCM to contact rheumatology regarding possible discontinuation of methotrexate and starting either an immunologic or biologic.   Assessment and Plan: Hypoxia Supplemental oxygen to keep O2 sat 93-96%. The patient is currently saturating at 93% on 2 L.    Hypokalemia: Resolved. Magnesium level resolved.  Was given potassium in the ER.    Essential hypertension:  Patient is normotensive on metoprolol and maxzide. Monitor BP   Aneurysm of thoracic aorta: Stable. Has yearly scan to monitor per patient   BMI - 29.29  Consultants: PCCM Procedures performed: None  Disposition: Home Diet recommendation:  Discharge Diet Orders (From admission, onward)     Start     Ordered   05/19/21 0000  Diet - low sodium heart healthy        05/19/21 1351           Cardiac diet  DISCHARGE MEDICATION: Allergies as of 05/19/2021       Reactions   Demerol [meperidine] Itching   Years ago, causes jittery, feels like going to pass out    Prednisone    JITTERY FEELING, UNABLE TO FOCUS   Zoloft [sertraline Hcl]    CAUSES PANIC ATTACKS        Medication List     STOP taking these medications    ELDERBERRY PO   methotrexate 2.5 MG tablet Commonly known as: RHEUMATREX       TAKE these medications    albuterol 108 (90 Base) MCG/ACT inhaler Commonly known as: VENTOLIN HFA Inhale 2 puffs into the lungs every 4 (four) hours as needed for wheezing or shortness of breath.   ALPRAZolam 0.25 MG tablet Commonly known as: XANAX Take 0.25 mg by mouth 2 (two) times daily as needed for anxiety (axiety as well as panic attacks). What changed: Another medication with the same name was changed. Make  sure you understand how and when to take each.   ALPRAZolam 0.5 MG tablet Commonly known as: XANAX Take 1 tablet (0.5 mg total) by mouth daily as needed for anxiety. What changed:  reasons to take this additional instructions   aspirin 81 MG EC tablet Take 1 tablet (81 mg total) by mouth daily. Swallow whole.   escitalopram 10 MG tablet Commonly known as: LEXAPRO Take 10 mg by mouth daily.   folic acid 1 MG tablet Commonly known as: FOLVITE Take 1 mg by mouth daily.   ipratropium-albuterol 0.5-2.5 (3) MG/3ML Soln Commonly known as:  DUONEB Take 3 mLs by nebulization every 6 (six) hours.   metoprolol succinate 50 MG 24 hr tablet Commonly known as: TOPROL-XL TAKE 1 TABLET BY MOUTH TWICE DAILY WITH OR IMMEDIATELY FOLLOWING A MEAL What changed: See the new instructions.   predniSONE 20 MG tablet Commonly known as: DELTASONE Take 3 tablets (60 mg total) by mouth daily with breakfast for 5 days, THEN 2.5 tablets (50 mg total) daily with breakfast for 7 days, THEN 2 tablets (40 mg total) daily with breakfast for 7 days, THEN 1.5 tablets (30 mg total) daily with breakfast for 7 days, THEN 1 tablet (20 mg total) daily with breakfast for 7 days, THEN 0.5 tablets (10 mg total) daily with breakfast. Start taking on: May 20, 2021   simvastatin 80 MG tablet Commonly known as: ZOCOR Take 40 mg by mouth at bedtime.   triamterene-hydrochlorothiazide 37.5-25 MG tablet Commonly known as: MAXZIDE-25 Take 1 tablet by mouth daily.   ZINC PO Take 1 tablet by mouth daily.         Discharge Exam: Filed Weights   05/16/21 1247  Weight: 68 kg   Vitals:   05/19/21 0900 05/19/21 1544  BP: (!) 100/56 124/61  Pulse: 68 (!) 59  Resp: 14 16  Temp: 97.6 F (36.4 C) 98.1 F (36.7 C)  SpO2: 98% 98%   Exam:  Constitutional:  The patient is awake, alert, and oriented x 3. No acute distress. Respiratory:  No increased work of breathing. No wheezes, rales, or rhonchi No tactile fremitus Cardiovascular:  Regular rate and rhythm No murmurs, ectopy, or gallups. No lateral PMI. No thrills. Abdomen:  Abdomen is soft, non-tender, non-distended No hernias, masses, or organomegaly Normoactive bowel sounds.  Musculoskeletal:  No cyanosis, clubbing, or edema Skin:  No rashes, lesions, ulcers palpation of skin: no induration or nodules Neurologic:  CN 2-12 intact Sensation all 4 extremities intact Psychiatric:  Mental status Mood, affect appropriate Orientation to person, place, time  judgment and insight appear  intact   Condition at discharge: fair  The results of significant diagnostics from this hospitalization (including imaging, microbiology, ancillary and laboratory) are listed below for reference.   Imaging Studies: No results found.  Microbiology: Results for orders placed or performed during the hospital encounter of 05/16/21  Resp Panel by RT-PCR (Flu A&B, Covid) Nasopharyngeal Swab     Status: None   Collection Time: 05/16/21  1:48 PM   Specimen: Nasopharyngeal Swab; Nasopharyngeal(NP) swabs in vial transport medium  Result Value Ref Range Status   SARS Coronavirus 2 by RT PCR NEGATIVE NEGATIVE Final    Comment: (NOTE) SARS-CoV-2 target nucleic acids are NOT DETECTED.  The SARS-CoV-2 RNA is generally detectable in upper respiratory specimens during the acute phase of infection. The lowest concentration of SARS-CoV-2 viral copies this assay can detect is 138 copies/mL. A negative result does not preclude SARS-Cov-2 infection and should not be  used as the sole basis for treatment or other patient management decisions. A negative result may occur with  improper specimen collection/handling, submission of specimen other than nasopharyngeal swab, presence of viral mutation(s) within the areas targeted by this assay, and inadequate number of viral copies(<138 copies/mL). A negative result must be combined with clinical observations, patient history, and epidemiological information. The expected result is Negative.  Fact Sheet for Patients:  EntrepreneurPulse.com.au  Fact Sheet for Healthcare Providers:  IncredibleEmployment.be  This test is no t yet approved or cleared by the [place] FDA and  has been authorized for detection and/or diagnosis of SARS-CoV-2 by FDA under an Emergency Use Authorization (EUA). This EUA will remain  in effect (meaning this test can be used) for the duration of the COVID-19 declaration under Section 564(b)(1)  of the Act, 21 U.S.C.section 360bbb-3(b)(1), unless the authorization is terminated  or revoked sooner.       Influenza A by PCR NEGATIVE NEGATIVE Final   Influenza B by PCR NEGATIVE NEGATIVE Final    Comment: (NOTE) The Xpert Xpress SARS-CoV-2/FLU/RSV plus assay is intended as an aid in the diagnosis of influenza from Nasopharyngeal swab specimens and should not be used as a sole basis for treatment. Nasal washings and aspirates are unacceptable for Xpert Xpress SARS-CoV-2/FLU/RSV testing.  Fact Sheet for Patients: EntrepreneurPulse.com.au  Fact Sheet for Healthcare Providers: IncredibleEmployment.be  This test is not yet approved or cleared by the [place] FDA and has been authorized for detection and/or diagnosis of SARS-CoV-2 by FDA under an Emergency Use Authorization (EUA). This EUA will remain in effect (meaning this test can be used) for the duration of the COVID-19 declaration under Section 564(b)(1) of the Act, 21 U.S.C. section 360bbb-3(b)(1), unless the authorization is terminated or revoked.  Performed at [facility], [address] [place], [place] [address]   Respiratory (~20 pathogens) panel by PCR     Status: None   Collection Time: 05/18/21  9:40 AM   Specimen: Nasopharyngeal Swab; Respiratory  Result Value Ref Range Status   Adenovirus NOT DETECTED NOT DETECTED Final   Coronavirus 229E NOT DETECTED NOT DETECTED Final    Comment: (NOTE) The Coronavirus on the Respiratory Panel, DOES NOT test for the novel  Coronavirus (2019 nCoV)    Coronavirus HKU1 NOT DETECTED NOT DETECTED Final   Coronavirus NL63 NOT DETECTED NOT DETECTED Final   Coronavirus OC43 NOT DETECTED NOT DETECTED Final   Metapneumovirus NOT DETECTED NOT DETECTED Final   Rhinovirus / Enterovirus NOT DETECTED NOT DETECTED Final   Influenza A NOT DETECTED NOT DETECTED Final   Influenza B NOT DETECTED NOT DETECTED Final   Parainfluenza  Virus 1 NOT DETECTED NOT DETECTED Final   Parainfluenza Virus 2 NOT DETECTED NOT DETECTED Final   Parainfluenza Virus 3 NOT DETECTED NOT DETECTED Final   Parainfluenza Virus 4 NOT DETECTED NOT DETECTED Final   Respiratory Syncytial Virus NOT DETECTED NOT DETECTED Final   Bordetella pertussis NOT DETECTED NOT DETECTED Final   Bordetella Parapertussis NOT DETECTED NOT DETECTED Final   Chlamydophila pneumoniae NOT DETECTED NOT DETECTED Final   Mycoplasma pneumoniae NOT DETECTED NOT DETECTED Final    Comment: Performed at [facility], [place]. [address], [place], [place] [address]    Labs: CBC: No results for input(s): WBC, NEUTROABS, HGB, HCT, MCV, PLT in the last 168 hours. Basic Metabolic Panel: No results for input(s): NA, K, CL, CO2, GLUCOSE, BUN, CREATININE, CALCIUM, MG, PHOS in the last 168  hours. Liver Function Tests: No results for input(s): AST, ALT, ALKPHOS, BILITOT, PROT, ALBUMIN in the last 168 hours. CBG: No results for input(s): GLUCAP in the last 168 hours.  Discharge time spent: greater than 30 minutes.  Signed: Bolton Canupp, DO [facility] 06/25/2021

## 2021-06-28 DIAGNOSIS — M810 Age-related osteoporosis without current pathological fracture: Secondary | ICD-10-CM | POA: Diagnosis not present

## 2021-06-28 DIAGNOSIS — D0512 Intraductal carcinoma in situ of left breast: Secondary | ICD-10-CM | POA: Diagnosis not present

## 2021-06-28 DIAGNOSIS — M67912 Unspecified disorder of synovium and tendon, left shoulder: Secondary | ICD-10-CM | POA: Diagnosis not present

## 2021-06-28 DIAGNOSIS — M06041 Rheumatoid arthritis without rheumatoid factor, right hand: Secondary | ICD-10-CM | POA: Diagnosis not present

## 2021-06-28 DIAGNOSIS — Z79899 Other long term (current) drug therapy: Secondary | ICD-10-CM | POA: Diagnosis not present

## 2021-06-28 DIAGNOSIS — M2550 Pain in unspecified joint: Secondary | ICD-10-CM | POA: Diagnosis not present

## 2021-06-30 DIAGNOSIS — Z961 Presence of intraocular lens: Secondary | ICD-10-CM | POA: Diagnosis not present

## 2021-07-13 ENCOUNTER — Telehealth: Payer: Self-pay | Admitting: Emergency Medicine

## 2021-07-13 MED ORDER — PREDNISONE 10 MG PO TABS: 10.0000 mg | ORAL_TABLET | Freq: Every day | ORAL | 0 refills | Status: AC

## 2021-07-13 NOTE — Telephone Encounter (Signed)
Called and spoke to patient and she states that she is down to 10mg  on the Prednisone but she wont have enough medication to get her through her next office visit. She needed an additional 14 days of medication.Sent in medication to her pharamcy. Nothing further needed  ?

## 2021-07-18 ENCOUNTER — Ambulatory Visit
Admission: RE | Admit: 2021-07-18 | Discharge: 2021-07-18 | Disposition: A | Discharge status: Home / Self Care | Payer: Medicare Other | Source: Ambulatory Visit | Attending: Emergency Medicine | Admitting: Emergency Medicine

## 2021-07-18 DIAGNOSIS — J929 Pleural plaque without asbestos: Secondary | ICD-10-CM | POA: Diagnosis not present

## 2021-07-18 DIAGNOSIS — I2510 Atherosclerotic heart disease of native coronary artery without angina pectoris: Secondary | ICD-10-CM | POA: Diagnosis not present

## 2021-07-18 DIAGNOSIS — J849 Interstitial pulmonary disease, unspecified: Secondary | ICD-10-CM

## 2021-07-18 DIAGNOSIS — J479 Bronchiectasis, uncomplicated: Secondary | ICD-10-CM | POA: Diagnosis not present

## 2021-07-18 DIAGNOSIS — K449 Diaphragmatic hernia without obstruction or gangrene: Secondary | ICD-10-CM | POA: Diagnosis not present

## 2021-08-01 ENCOUNTER — Ambulatory Visit: Payer: Medicare Other | Admitting: Emergency Medicine

## 2021-08-03 ENCOUNTER — Encounter: Payer: Self-pay | Admitting: Emergency Medicine

## 2021-08-03 ENCOUNTER — Other Ambulatory Visit: Payer: Self-pay

## 2021-08-03 ENCOUNTER — Ambulatory Visit: Payer: Medicare Other | Admitting: Emergency Medicine

## 2021-08-03 DIAGNOSIS — J189 Pneumonia, unspecified organism: Secondary | ICD-10-CM

## 2021-08-03 NOTE — Progress Notes (Signed)
? ?  Subjective:  ? ? Patient ID: Tiffany Kane, female    DOB: 11-Dec-1940, 81 y.o.   MRN: [id number] ? ?HPI ? ?ROV 08/03/21 --follow-up visit for 81 year old woman with a history of RA previously on methotrexate, breast cancer, hypertension.  She had COP in 2016, 2014.  She has had flaring symptoms of dyspnea that correlate with bilateral groundglass opacities especially on the right consistent with recurrence of her inflammatory lung disease.  Her methotrexate has been stopped and she has improved with a slow taper of prednisone.  Her current dose is 10 mg daily.  Repeat CT as below ? ?High-resolution CT scan of the chest done 07/18/2021 reviewed by me shows significant regression of her groundglass infiltrates compared with January 2023.  There are still a few patchy areas of groundglass attenuation.  There is septal thickening and some subpleural reticulation, mild cylindrical bronchiectatic change without overt honeycombing.  Overall similar to 04/03/2021.  ? ? ?Review of Systems ?As per HPI ? ? ?   ?Objective:  ? Physical Exam ?Vitals:  ? 08/03/21 1452  ?BP: 128/70  ?Pulse: 66  ?Temp: 97.9 ?F (36.6 ?C)  ?TempSrc: Oral  ?SpO2: 98%  ?Weight: 154 lb 3.2 oz (69.9 kg)  ?Height: 5' (1.524 m)  ? ?Gen: Pleasant, overweight elderly woman, well-nourished, in no distress,  normal affect ? ?ENT: No lesions,  mouth clear,  oropharynx clear, no postnasal drip ? ?Neck: No JVD, no stridor ? ?Lungs: No use of accessory muscles, no crackles or wheezing on normal respiration, no wheeze on forced expiration ? ?Cardiovascular: RRR, heart sounds normal, no murmur or gallops, no peripheral edema ? ?Musculoskeletal: No deformities, no cyanosis or clubbing ? ?Neuro: alert, awake, non focal ? ?Skin: Warm, no lesions or rash ? ? ?   ?Assessment & Plan:  ? ?Pneumonitis ?Acute pneumonitis, consistent with COP, question due to her rheumatoid but also consider methotrexate.  The methotrexate has been stopped and she is improved clinically  and radiographically with a prolonged steroid taper.  She is now on 10 mg and I would like to try to stop as long as its okay with rheumatology.  I will consult with them regarding this plan.  We will plan surveillance imaging depending on how she is doing clinically.  She will let me know if she backtracks when we come off the prednisone. ? ?We reviewed your CT scan of the chest today.  It is significantly improved compared with January. ?Stay on prednisone 10 mg daily for now.  We will coordinate with Rheumatology to ensure that all agree that it is okay to come off of the prednisone. ?Do not believe you will be able to go back on methotrexate ?We will determine the timing of any repeat chest imaging based on how you are doing going forward ?Follow Dr. Lamonte Sakai in 6 months or sooner if you have any changes in your breathing, other problems. ? ? ?Baltazar Apo, MD, PhD ?08/03/2021, 3:07 PM ?[facility] ?[phone] or if no answer before 7:00PM call [phone] ?For any issues after 7:00PM please call eLink [phone] ? ?

## 2021-08-03 NOTE — Patient Instructions (Addendum)
We reviewed your CT scan of the chest today.  It is significantly improved compared with January. ?Stay on prednisone 10 mg daily for now.  We will coordinate with Rheumatology to ensure that all agree that it is okay to come off of the prednisone. ?Do not believe you will be able to go back on methotrexate ?We will determine the timing of any repeat chest imaging based on how you are doing going forward ?Follow Dr. Lamonte Sakai in 6 months or sooner if you have any changes in your breathing, other problems. ? ?

## 2021-08-03 NOTE — Assessment & Plan Note (Signed)
Acute pneumonitis, consistent with COP, question due to her rheumatoid but also consider methotrexate.  The methotrexate has been stopped and she is improved clinically and radiographically with a prolonged steroid taper.  She is now on 10 mg and I would like to try to stop as long as its okay with rheumatology.  I will consult with them regarding this plan.  We will plan surveillance imaging depending on how she is doing clinically.  She will let me know if she backtracks when we come off the prednisone. ? ?We reviewed your CT scan of the chest today.  It is significantly improved compared with January. ?Stay on prednisone 10 mg daily for now.  We will coordinate with Rheumatology to ensure that all agree that it is okay to come off of the prednisone. ?Do not believe you will be able to go back on methotrexate ?We will determine the timing of any repeat chest imaging based on how you are doing going forward ?Follow Dr. Lamonte Sakai in 6 months or sooner if you have any changes in your breathing, other problems. ?

## 2021-08-08 NOTE — Progress Notes (Signed)
?  ?Cardiology Office Note ? ? ?Date:  08/09/2021  ? ?ID:  Tiffany Kane, DOB 31-Dec-1940, MRN [id number] ? ?PCP:  Glenis Smoker, MD  ? ? ?Chief Complaint  ?Patient presents with  ? Follow-up  ? ? ? ?Wt Readings from Last 3 Encounters:  ?08/09/21 156 lb (70.8 kg)  ?08/03/21 154 lb 3.2 oz (69.9 kg)  ?06/08/21 149 lb 12.8 oz (67.9 kg)  ?  ? ?  ?History of Present Illness: ?Tiffany Kane is a 81 y.o. female   who has had a thoracic aneurysm. It was stable in size by MRA in 2011. MRA in 3/17 showed a diameter of 4.4 cm of the thoracic aorta. ?  ?Exercise has been Limited by back pain.  Lost 9 lbs in late 2018. ?  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 She has not had a documented blood pressure difference between the 2 arms at any prior cardiology visit.  No weakness in her arms.  No problems holding items.  No difference in home BP readings between arms." ? ?Hospitalized in Jan 2023 for ILD.  ? ?Echo showed: ?"Left ventricular ejection fraction, by estimation, is 55 to 60%. The  ?left ventricle has normal function. The left ventricle has no regional  ?wall motion abnormalities. Left ventricular diastolic parameters are  ?indeterminate.  ? 2. Right ventricular systolic function is normal. The right ventricular  ?size is normal.  ? 3. The mitral valve is normal in structure. Mild mitral valve  ?regurgitation.  ? 4. Tricuspid valve regurgitation is mild to moderate.  ? 5. The aortic valve is tricuspid. Aortic valve regurgitation is trivial.  ?Aortic valve sclerosis is present, with no evidence of aortic valve  ?stenosis.  ? 6. The inferior vena cava dilated size. " ? ?Denies : Chest pain. Dizziness. Leg edema. Nitroglycerin use. Orthopnea. Palpitations. Paroxysmal nocturnal dyspnea. Syncope.   ? ?Mild DOE- stable.  Walking every other day just a little. O complaints regarding her arms ? ?S[facility] walk at the [facility]  ? ?Past Medical History:   ?Diagnosis Date  ? Anxiety   ? Arthritis   ? Breast cancer ([place]) 2002  ? DUCTAL IN SITU.Mar[facility] Kitchen1999  ? Bronchitis   ? Compression fracture   ? T-11  ? Coronary artery disease   ? Depression   ? Depression   ? situational stress  ? Esophageal reflux   ? H/O ascites 09/2004  ? High cholesterol   ? Hyperlipemia   ? Hyperlipidemia   ? Hypertension   ? Osteopenia   ? Osteoporosis 03/2006  ? Pneumonia   ? Thoracic aneurysm   ? DR. Leonia Reeves  ? Thyromegaly   ? ? ?Past Surgical History:  ?Procedure Laterality Date  ? ABDOMINAL HYSTERECTOMY    ? BREAST BIOPSY    ? CARDIAC CATHETERIZATION  06/15/1999  ? CATARACT EXTRACTION Right   ? CERVICAL SPINE SURGERY    ? VIDEO BRONCHOSCOPY  05/26/2012  ? Procedure: VIDEO BRONCHOSCOPY WITH FLUORO;  Surgeon: Collene Gobble, MD;  Location: [facility];  Service: Cardiopulmonary;  Laterality: Bilateral;  ? ? ? ?Current Outpatient Medications  ?Medication Sig Dispense Refill  ? albuterol (VENTOLIN HFA) 108 (90 Base) MCG/ACT inhaler Inhale 2 puffs into the lungs every 4 (four) hours as needed for wheezing or shortness of breath. 18 g 0  ? ALPRAZolam (XANAX) 0.25 MG tablet Take 0.25 mg by mouth 2 (two) times daily as needed  for anxiety (axiety as well as panic attacks).    ? aspirin 81 MG chewable tablet Chew 1 tablet by mouth daily.    ? aspirin EC 81 MG EC tablet Take 1 tablet (81 mg total) by mouth daily. Swallow whole. 30 tablet 11  ? escitalopram (LEXAPRO) 10 MG tablet Take 10 mg by mouth daily.    ? folic acid (FOLVITE) 1 MG tablet Take 1 mg by mouth daily.  1  ? hydroxychloroquine (PLAQUENIL) 200 MG tablet Take 200 mg by mouth 2 (two) times daily.    ? metoprolol succinate (TOPROL-XL) 50 MG 24 hr tablet TAKE 1 TABLET BY MOUTH TWICE DAILY WITH OR IMMEDIATELY FOLLOWING A MEAL 180 tablet 3  ? Multiple Vitamins-Minerals (ZINC PO) Take 1 tablet by mouth daily.    ? simvastatin (ZOCOR) 80 MG tablet Take 40 mg by mouth at bedtime.    ? triamterene-hydrochlorothiazide (MAXZIDE-25) 37.5-25 MG per  tablet Take 1 tablet by mouth daily.    ? ?No current facility-administered medications for this visit.  ? ? ?Allergies:   Demerol [meperidine], Prednisone, and Zoloft [sertraline hcl]  ? ? ?Social History:  The patient  reports that she quit smoking about 29 years ago. Her smoking use included cigarettes. She has a 3.00 pack-year smoking history. She has never used smokeless tobacco. She reports current alcohol use. She reports that she does not use drugs.  ? ?Family History:  The patient's family history includes Bone cancer in her father; Breast cancer in her mother; Diabetes in her mother and sister; Heart attack in her brother; Heart disease in her brother; Hypertension in her mother and sister; Prostate cancer in her brother and father.  ? ? ?ROS:  Please see the history of present illness.   Otherwise, review of systems are positive for stable DOE.   All other systems are reviewed and negative.  ? ? ?PHYSICAL EXAM: ?VS:  BP 116/66   Pulse 70   Ht 5' (1.524 m)   Wt 156 lb (70.8 kg)   SpO2 98%   BMI 30.47 kg/m?  , BMI Body mass index is 30.47 kg/m?. ?GEN: Well nourished, well developed, in no acute distress ?HEENT: normal ?Neck: no JVD, carotid bruits, or masses ?Cardiac: RRR; no murmurs, rubs, or gallops,no edema  ?Respiratory:  clear to auscultation bilaterally, normal work of breathing ?GI: soft, nontender, nondistended, + BS ?MS: no deformity or atrophy ?Skin: warm and dry, no rash ?Neuro:  Strength and sensation are intact ?Psych: euthymic mood, full affect ? ? ?EKG:   ?The ekg ordered Jan 2023 demonstrates NSR, no ST segment chnages ? ? ?Recent Labs: ?05/16/2021: ALT 11; B Natriuretic Peptide 25.8 ?05/17/2021: Magnesium 2.2 ?05/19/2021: BUN 15; Creatinine, Ser 0.85; Hemoglobin 10.9; Platelets 353; Potassium 4.3; Sodium 135  ? ?Lipid Panel ?No results found for: CHOL, TRIG, HDL, CHOLHDL, VLDL, LDLCALC, LDLDIRECT ?  ?Other studies Reviewed: ?Additional studies/ records that were reviewed today with results  demonstrating: labs reviewed. ? ? ?ASSESSMENT AND PLAN: ? ?Thoracic aneurysm: The PE CT scan in January 2023 showed the size was 4.2 cm, unchanged from 2019.  Her blood pressure has been well controlled.  The lack of change in size in the aorta is reassuring.  Will not plan for any dedicated imaging of the aorta.  She has chest CTs for her lungs frequently enough that that may be sufficient.  In addition given the lack of change and her age, screening may not be that high yield. ?Hypertension:The current medical regimen  is effective;  continue present plan and medications. ?Hyperlipidemia: HDL 66, LDL 109, total cholesterol 200, triglycerides 143 in July 2022.  Whole food, plant-based diet.  Avoid processed food.  High-fiber diet.  Stop simvastatin, start rosuvastatin 20 mg daily.  Will check lipids and liver tests in about 2 to 3 months.  Less side effects with rosuvastatin compared to simvastatin.  Also, better prevention and lipid-lowering with the rosuvastatin. ?CAD: Mild, nonobstructive disease by cath in 2001.  Negative stress test in 2010. No complaints regarding strength in her arms as there was a question of subclavian stenosis in the past. No syncope.  Stay well hydrated.  ? ? ?Current medicines are reviewed at length with the patient today.  The patient concerns regarding her medicines were addressed. ? ?The following changes have been made:  change statin ? ?Labs/ tests ordered today include:  ?No orders of the defined types were placed in this encounter. ? ? ?Recommend 150 minutes/week of aerobic exercise ?Low fat, low carb, high fiber diet recommended ? ?Disposition:   FU in 1 year ? ? ?Signed, ?Larae Grooms, MD  ?08/09/2021 8:20 AM    ?[facility] ?[place], [place], [place]  [address] ?Phone: [phone]; Fax: [phone]  ? ?

## 2021-08-09 ENCOUNTER — Ambulatory Visit: Payer: Medicare Other | Admitting: Interventional Cardiology

## 2021-08-09 ENCOUNTER — Other Ambulatory Visit: Payer: Self-pay

## 2021-08-09 ENCOUNTER — Encounter: Payer: Self-pay | Admitting: Interventional Cardiology

## 2021-08-09 VITALS — BP 116/66 | HR 70 | Ht 60.0 in | Wt 156.0 lb

## 2021-08-09 DIAGNOSIS — I712 Thoracic aortic aneurysm, without rupture, unspecified: Secondary | ICD-10-CM | POA: Diagnosis not present

## 2021-08-09 DIAGNOSIS — I25118 Atherosclerotic heart disease of native coronary artery with other forms of angina pectoris: Secondary | ICD-10-CM | POA: Diagnosis not present

## 2021-08-09 DIAGNOSIS — I10 Essential (primary) hypertension: Secondary | ICD-10-CM | POA: Diagnosis not present

## 2021-08-09 DIAGNOSIS — E782 Mixed hyperlipidemia: Secondary | ICD-10-CM | POA: Diagnosis not present

## 2021-08-09 MED ORDER — ROSUVASTATIN CALCIUM 20 MG PO TABS: 20.0000 mg | ORAL_TABLET | Freq: Every day | ORAL | 3 refills | Status: DC

## 2021-08-09 NOTE — Patient Instructions (Addendum)
Medication Instructions:  ?Your physician has recommended you make the following change in your medication:  ?Stop Simvastatin. ?Start Rosuvastatin 20 mg by mouth daily ? ?*If you need a refill on your cardiac medications before your next appointment, please call your pharmacy* ? ? ?Lab Work: ?Your physician recommends that you return for lab work on November 09, 2021.  Lipid and liver profiles.  This will be fasting.  The lab opens at 7:15 AM ? ?If you have labs (blood work) drawn today and your tests are completely normal, you will receive your results only by: ?MyChart Message (if you have MyChart) OR ?A paper copy in the mail ?If you have any lab test that is abnormal or we need to change your treatment, we will call you to review the results. ? ? ?Testing/Procedures: ?none ? ? ?Follow-Up: ?At [facility], you and your health needs are our priority.  As part of our continuing mission to provide you with exceptional heart care, we have created designated Provider Care Teams.  These Care Teams include your primary Cardiologist (physician) and Advanced Practice Providers (APPs -  Physician Assistants and Nurse Practitioners) who all work together to provide you with the care you need, when you need it. ? ?We recommend signing up for the patient portal called "MyChart".  Sign up information is provided on this After Visit Summary.  MyChart is used to connect with patients for Virtual Visits (Telemedicine).  Patients are able to view lab/test results, encounter notes, upcoming appointments, etc.  Non-urgent messages can be sent to your provider as well.   ?To learn more about what you can do with MyChart, go to [email or web address].   ? ?Your next appointment:   ?12 month(s) ? ?The format for your next appointment:   ?In Person ? ?Provider:   ?Larae Grooms, MD   ? ? ?Other Instructions ? ? ?

## 2021-08-28 DIAGNOSIS — Z79899 Other long term (current) drug therapy: Secondary | ICD-10-CM | POA: Diagnosis not present

## 2021-08-28 DIAGNOSIS — M810 Age-related osteoporosis without current pathological fracture: Secondary | ICD-10-CM | POA: Diagnosis not present

## 2021-08-28 DIAGNOSIS — M2550 Pain in unspecified joint: Secondary | ICD-10-CM | POA: Diagnosis not present

## 2021-08-28 DIAGNOSIS — M06041 Rheumatoid arthritis without rheumatoid factor, right hand: Secondary | ICD-10-CM | POA: Diagnosis not present

## 2021-09-26 DIAGNOSIS — M810 Age-related osteoporosis without current pathological fracture: Secondary | ICD-10-CM | POA: Diagnosis not present

## 2021-09-26 DIAGNOSIS — I10 Essential (primary) hypertension: Secondary | ICD-10-CM | POA: Diagnosis not present

## 2021-11-09 ENCOUNTER — Other Ambulatory Visit: Payer: Medicare Other

## 2021-11-15 ENCOUNTER — Other Ambulatory Visit: Payer: Medicare Other

## 2021-11-28 DIAGNOSIS — M2550 Pain in unspecified joint: Secondary | ICD-10-CM | POA: Diagnosis not present

## 2021-11-28 DIAGNOSIS — M810 Age-related osteoporosis without current pathological fracture: Secondary | ICD-10-CM | POA: Diagnosis not present

## 2021-11-28 DIAGNOSIS — M06041 Rheumatoid arthritis without rheumatoid factor, right hand: Secondary | ICD-10-CM | POA: Diagnosis not present

## 2021-11-28 DIAGNOSIS — Z79899 Other long term (current) drug therapy: Secondary | ICD-10-CM | POA: Diagnosis not present

## 2021-12-07 ENCOUNTER — Other Ambulatory Visit: Payer: Medicare Other

## 2021-12-07 DIAGNOSIS — E782 Mixed hyperlipidemia: Secondary | ICD-10-CM

## 2021-12-07 DIAGNOSIS — Z1231 Encounter for screening mammogram for malignant neoplasm of breast: Secondary | ICD-10-CM | POA: Diagnosis not present

## 2021-12-07 LAB — HEPATIC FUNCTION PANEL
ALT: 10 IU/L (ref 0–32)
AST: 23 IU/L (ref 0–40)
Albumin: 4.5 g/dL (ref 3.7–4.7)
Alkaline Phosphatase: 67 IU/L (ref 44–121)
Bilirubin Total: 0.5 mg/dL (ref 0.0–1.2)
Bilirubin, Direct: 0.15 mg/dL (ref 0.00–0.40)
Total Protein: 6.4 g/dL (ref 6.0–8.5)

## 2021-12-07 LAB — LIPID PANEL
Chol/HDL Ratio: 2.4 ratio (ref 0.0–4.4)
Cholesterol, Total: 173 mg/dL (ref 100–199)
HDL: 71 mg/dL (ref >39)
LDL Chol Calc (NIH): 82 mg/dL (ref 0–99)
Triglycerides: 116 mg/dL (ref 0–149)
VLDL Cholesterol Cal: 20 mg/dL (ref 5–40)

## 2022-01-11 DIAGNOSIS — I2510 Atherosclerotic heart disease of native coronary artery without angina pectoris: Secondary | ICD-10-CM | POA: Diagnosis not present

## 2022-01-11 DIAGNOSIS — Z79899 Other long term (current) drug therapy: Secondary | ICD-10-CM | POA: Diagnosis not present

## 2022-01-11 DIAGNOSIS — M069 Rheumatoid arthritis, unspecified: Secondary | ICD-10-CM | POA: Diagnosis not present

## 2022-01-11 DIAGNOSIS — E782 Mixed hyperlipidemia: Secondary | ICD-10-CM | POA: Diagnosis not present

## 2022-01-11 DIAGNOSIS — N3000 Acute cystitis without hematuria: Secondary | ICD-10-CM | POA: Diagnosis not present

## 2022-01-11 DIAGNOSIS — Z23 Encounter for immunization: Secondary | ICD-10-CM | POA: Diagnosis not present

## 2022-01-11 DIAGNOSIS — M810 Age-related osteoporosis without current pathological fracture: Secondary | ICD-10-CM | POA: Diagnosis not present

## 2022-01-11 DIAGNOSIS — R3915 Urgency of urination: Secondary | ICD-10-CM | POA: Diagnosis not present

## 2022-01-11 DIAGNOSIS — I10 Essential (primary) hypertension: Secondary | ICD-10-CM | POA: Diagnosis not present

## 2022-01-11 DIAGNOSIS — Z0000 Encounter for general adult medical examination without abnormal findings: Secondary | ICD-10-CM | POA: Diagnosis not present

## 2022-01-24 ENCOUNTER — Telehealth: Payer: Self-pay | Admitting: Emergency Medicine

## 2022-01-24 NOTE — Telephone Encounter (Signed)
Patient states she got a call Dale stating RB order a test for her to do overnight and mail back. She was unsure what this was and why it was needed.  Please advise, call back [phone], leave voicemail if needed.

## 2022-01-26 NOTE — Telephone Encounter (Signed)
Called and spoke to patient and she is wondering about the overnight test. I clarified with patient what the test was. But she is wondering if the test is meant for her.  Sir I don't see in her chart that an ONO was ordered.  Can you confirm if this is needed

## 2022-01-29 NOTE — Telephone Encounter (Signed)
I don't see anything in my notes about needing an ONO. Does she have O2 at home already? Was she using O2 or did she have a sleep study. Unless she had O2 at home and we were hoping to discontinue it, I am not sure this test needs to be done.

## 2022-01-29 NOTE — Telephone Encounter (Signed)
Called patient back and advised her that the ONO that she received a call about had to be a mix up. I advised her the Dr Lamonte Sakai did not see anything about an ONO inher chart. She verbalized understanding. Nothing further needed

## 2022-03-06 ENCOUNTER — Ambulatory Visit (HOSPITAL_COMMUNITY)
Admission: RE | Admit: 2022-03-06 | Discharge: 2022-03-06 | Disposition: A | Discharge status: Home / Self Care | Payer: Medicare Other | Source: Ambulatory Visit | Attending: Interventional Cardiology | Admitting: Interventional Cardiology

## 2022-03-06 DIAGNOSIS — I712 Thoracic aortic aneurysm, without rupture, unspecified: Secondary | ICD-10-CM | POA: Diagnosis not present

## 2022-03-06 DIAGNOSIS — K449 Diaphragmatic hernia without obstruction or gangrene: Secondary | ICD-10-CM | POA: Diagnosis not present

## 2022-03-06 MED ORDER — GADOBUTROL 1 MMOL/ML IV SOLN
7.0000 mL | Freq: Once | INTRAVENOUS | Status: AC | PRN
  Administered 2022-03-06: 7 mL via INTRAVENOUS

## 2022-03-07 ENCOUNTER — Ambulatory Visit: Payer: Medicare Other | Admitting: Emergency Medicine

## 2022-03-07 ENCOUNTER — Encounter: Payer: Self-pay | Admitting: Emergency Medicine

## 2022-03-07 DIAGNOSIS — J849 Interstitial pulmonary disease, unspecified: Secondary | ICD-10-CM

## 2022-03-07 NOTE — Addendum Note (Signed)
Addended by: Loma Sousa on: 03/07/2022 09:47 AM   Modules accepted: Orders

## 2022-03-07 NOTE — Assessment & Plan Note (Signed)
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  Question whether this was related to methotrexate?  She is now off this medication, on hydroxychloroquine.  Her prednisone is been weaned off.  Most recent CT chest March 2023 was back to baseline.  We will repeat her CT March 2024.  If stable then we can likely decrease the frequency of surveillance CTs.  She will need to be followed given her history of RA

## 2022-03-07 NOTE — Patient Instructions (Addendum)
We will plan to repeat your high-resolution CT scan of the chest in March 2024 Continue your hydroxychloroquine as ordered and follow with Rheumatology as planned We will not start any scheduled inhaler medications at this time Okay to keep your albuterol available to use 2 puffs if needed for shortness of breath Follow Dr. Lamonte Sakai in March to review the results of your CT, sooner if you have any problems.

## 2022-03-07 NOTE — Progress Notes (Signed)
   Subjective:    Patient ID: Tiffany Kane, female    DOB: June 04, 1940, 81 y.o.   MRN: [id number]  HPI  ROV 08/03/21 --follow-up visit for 81 year old woman with a history of RA previously on methotrexate, breast cancer, hypertension.  She had COP in 2016, 2014.  She has had flaring symptoms of dyspnea that correlate with bilateral groundglass opacities especially on the right consistent with recurrence of her inflammatory lung disease.  Her methotrexate has been stopped and she has improved with a slow taper of prednisone.  Her current dose is 10 mg daily.  Repeat CT as below  High-resolution CT scan of the chest done 07/18/2021 reviewed by me shows significant regression of her groundglass infiltrates compared with January 2023.  There are still a few patchy areas of groundglass attenuation.  There is septal thickening and some subpleural reticulation, mild cylindrical bronchiectatic change without overt honeycombing.  Overall similar to 04/03/2021.   ROV 03/07/22 --81 year old woman with rheumatoid arthritis (seronegative), breast cancer, hypertension, COP (2014, 2016).  She had a more recent episode of pneumonitis, question related to methotrexate which was ultimately stopped. Changed to hydroxychlorquine.  She was treated with a slow prednisone taper and infiltrates, clinical status improved..  She returns today reporting that she is doing well, that her pred has been tapered to off. No joint pain or swelling. No resp sx.  Flu and covid shots up to date.    Review of Systems As per HPI      Objective:   Physical Exam Vitals:   03/07/22 0844  BP: 132/72  Pulse: 63  Temp: 98.3 F (36.8 C)  SpO2: 96%  Weight: 161 lb (73 kg)  Height: 5' (1.524 m)   Gen: Pleasant, overweight elderly woman, well-nourished, in no distress,  normal affect  ENT: No lesions,  mouth clear,  oropharynx clear, no postnasal drip  Neck: No JVD, no stridor  Lungs: No use of accessory muscles, no crackles  or wheezing on normal respiration, no wheeze on forced expiration  Cardiovascular: RRR, heart sounds normal, no murmur or gallops, no peripheral edema  Musculoskeletal: No deformities, no cyanosis or clubbing  Neuro: alert, awake, non focal  Skin: Warm, no lesions or rash      Assessment & Plan:   ILD (interstitial lung disease) (HCC) History of cryptogenic organizing pneumonia, 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  Question whether this was related to methotrexate?  She is now off this medication, on hydroxychloroquine.  Her prednisone is been weaned off.  Most recent CT chest March 2023 was back to baseline.  We will repeat her CT March 2024.  If stable then we can likely decrease the frequency of surveillance CTs.  She will need to be followed given her history of RA   Baltazar Apo, MD, PhD 03/07/2022, 9:07 AM [facility] [phone] or if no answer before 7:00PM call [phone] For any issues after 7:00PM please call eLink [phone]

## 2022-04-27 DIAGNOSIS — R1310 Dysphagia, unspecified: Secondary | ICD-10-CM | POA: Diagnosis not present

## 2022-04-27 DIAGNOSIS — T18128A Food in esophagus causing other injury, initial encounter: Secondary | ICD-10-CM | POA: Diagnosis not present

## 2022-05-15 DIAGNOSIS — R9389 Abnormal findings on diagnostic imaging of other specified body structures: Secondary | ICD-10-CM | POA: Diagnosis not present

## 2022-05-15 DIAGNOSIS — L659 Nonscarring hair loss, unspecified: Secondary | ICD-10-CM | POA: Diagnosis not present

## 2022-05-15 DIAGNOSIS — E559 Vitamin D deficiency, unspecified: Secondary | ICD-10-CM | POA: Diagnosis not present

## 2022-05-17 ENCOUNTER — Other Ambulatory Visit: Payer: Self-pay | Admitting: Family Medicine

## 2022-05-17 DIAGNOSIS — R9389 Abnormal findings on diagnostic imaging of other specified body structures: Secondary | ICD-10-CM

## 2022-05-17 DIAGNOSIS — I7090 Unspecified atherosclerosis: Secondary | ICD-10-CM

## 2022-05-30 ENCOUNTER — Ambulatory Visit
Admission: RE | Admit: 2022-05-30 | Discharge: 2022-05-30 | Disposition: A | Discharge status: Home / Self Care | Payer: Medicare Other | Source: Ambulatory Visit | Attending: Family Medicine | Admitting: Family Medicine

## 2022-05-30 DIAGNOSIS — I771 Stricture of artery: Secondary | ICD-10-CM | POA: Diagnosis not present

## 2022-05-30 DIAGNOSIS — R9389 Abnormal findings on diagnostic imaging of other specified body structures: Secondary | ICD-10-CM

## 2022-05-30 DIAGNOSIS — I7090 Unspecified atherosclerosis: Secondary | ICD-10-CM

## 2022-05-30 DIAGNOSIS — I6523 Occlusion and stenosis of bilateral carotid arteries: Secondary | ICD-10-CM | POA: Diagnosis not present

## 2022-06-01 ENCOUNTER — Other Ambulatory Visit: Payer: Self-pay | Admitting: Family Medicine

## 2022-06-01 DIAGNOSIS — E041 Nontoxic single thyroid nodule: Secondary | ICD-10-CM

## 2022-06-06 ENCOUNTER — Encounter: Payer: Self-pay | Admitting: Vascular Surgery

## 2022-06-06 ENCOUNTER — Ambulatory Visit: Payer: Medicare Other | Admitting: Vascular Surgery

## 2022-06-06 VITALS — BP 119/75 | HR 83 | Temp 98.1°F | Resp 20 | Ht 60.0 in | Wt 163.0 lb

## 2022-06-06 DIAGNOSIS — I6523 Occlusion and stenosis of bilateral carotid arteries: Secondary | ICD-10-CM | POA: Diagnosis not present

## 2022-06-06 NOTE — Progress Notes (Signed)
Patient ID: Tiffany Kane, female   DOB: 06-Apr-1941, 82 y.o.   MRN: [id number]  Reason for Consult: New Patient (Initial Visit)   Referred by Saintclair Halsted, FNP  Subjective:     HPI:  Tiffany Kane is a 82 y.o. female with known history of ascending aortic aneurysm followed by Dr.  Irish Lack.  More recently she was found to have calcification in her carotid arteries confirmed with ultrasound.  She denies any history of stroke, TIA or amaurosis.  She does take aspirin and a statin daily.  She has hyperlipidemia and hypertension and has underlying risk factors. She remains very active denies any claudication or rest pain.  Past Medical History:  Diagnosis Date   Anxiety    Arthritis    Breast cancer ([place]) 2002   DUCTAL IN SITU.Marland Kitchen1999   Bronchitis    Compression fracture    T-11   Coronary artery disease    Depression    Depression    situational stress   Esophageal reflux    H/O ascites 09/2004   High cholesterol    Hyperlipemia    Hyperlipidemia    Hypertension    Osteopenia    Osteoporosis 03/2006   Pneumonia    Thoracic aneurysm    DR. Leonia Reeves   Thyromegaly    Family History  Problem Relation Age of Onset   Breast cancer Mother    Diabetes Mother    Hypertension Mother    Bone cancer Father    Prostate cancer Father    Heart disease Brother    Prostate cancer Brother    Hypertension Sister    Diabetes Sister    Heart attack Brother    Past Surgical History:  Procedure Laterality Date   ABDOMINAL HYSTERECTOMY     BREAST BIOPSY     CARDIAC CATHETERIZATION  06/15/1999   CATARACT EXTRACTION Right    CERVICAL SPINE SURGERY     VIDEO BRONCHOSCOPY  05/26/2012   Procedure: VIDEO BRONCHOSCOPY WITH FLUORO;  Surgeon: Collene Gobble, MD;  Location: [facility];  Service: Cardiopulmonary;  Laterality: Bilateral;    Short Social History:  Social History   Tobacco Use   Smoking status: Former    Packs/day: 0.50    Years: 6.00    Total pack years:  3.00    Types: Cigarettes    Quit date: 05/14/1992    Years since quitting: 30.0   Smokeless tobacco: Never  Substance Use Topics   Alcohol use: Yes    Comment: RARELY /once year     Allergies  Allergen Reactions   Demerol [Meperidine] Itching    Years ago, causes jittery, feels like going to pass out    Alendronate     Other Reaction(s): GI upset   Meloxicam     Other Reaction(s): ulcer   Zoledronic Acid     Other Reaction(s): Flu like sx   Prednisone     JITTERY FEELING, UNABLE TO FOCUS   Zoloft [Sertraline Hcl]     CAUSES PANIC ATTACKS    Current Outpatient Medications  Medication Sig Dispense Refill   albuterol (VENTOLIN HFA) 108 (90 Base) MCG/ACT inhaler Inhale 2 puffs into the lungs every 4 (four) hours as needed for wheezing or shortness of breath. 18 g 0   ALPRAZolam (XANAX) 0.25 MG tablet Take 0.25 mg by mouth 2 (two) times daily.     aspirin EC 81 MG EC tablet Take 1 tablet (81 mg total) by mouth daily. Swallow whole. [place]  tablet 11   escitalopram (LEXAPRO) 10 MG tablet Take 10 mg by mouth daily.     folic acid (FOLVITE) 1 MG tablet Take 1 mg by mouth daily.  1   hydroxychloroquine (PLAQUENIL) 200 MG tablet Take 200 mg by mouth daily.     metoprolol succinate (TOPROL-XL) 50 MG 24 hr tablet TAKE 1 TABLET BY MOUTH TWICE DAILY WITH OR IMMEDIATELY FOLLOWING A MEAL 180 tablet 3   Multiple Vitamins-Minerals (ZINC PO) Take 1 tablet by mouth daily.     rosuvastatin (CRESTOR) 20 MG tablet Take 1 tablet (20 mg total) by mouth daily. 90 tablet 3   triamterene-hydrochlorothiazide (MAXZIDE-25) 37.5-25 MG per tablet Take 1 tablet by mouth daily.     No current facility-administered medications for this visit.    Review of Systems  Constitutional:  Constitutional negative. HENT: HENT negative.  Eyes: Eyes negative.  Respiratory: Respiratory negative.  Cardiovascular: Cardiovascular negative.  GI: Gastrointestinal negative.  Musculoskeletal: Musculoskeletal negative.  Skin:  Skin negative.  Neurological: Neurological negative. Hematologic: Hematologic/lymphatic negative.  Psychiatric: Psychiatric negative.        Objective:  Objective   Vitals:   06/06/22 1048 06/06/22 1051  BP: 114/67 119/75  Pulse: 83   Resp: 20   Temp: 98.1 F (36.7 C)   SpO2: 82%    Systolic blood pressure above this was similar   Physical Exam HENT:     Head: Normocephalic.     Nose: Nose normal.  Eyes:     Pupils: Pupils are equal, round, and reactive to light.  Neck:     Vascular: No carotid bruit.  Cardiovascular:     Rate and Rhythm: Normal rate.     Pulses:          Radial pulses are 2+ on the right side and 2+ on the left side.  Pulmonary:     Effort: Pulmonary effort is normal.  Abdominal:     General: Abdomen is flat.  Musculoskeletal:     Cervical back: Normal range of motion and neck supple.     Right lower leg: Edema present.     Left lower leg: Edema present.  Skin:    General: Skin is warm.     Capillary Refill: Capillary refill takes less than 2 seconds.  Neurological:     General: No focal deficit present.     Mental Status: She is alert.  Psychiatric:        Mood and Affect: Mood normal.        Behavior: Behavior normal.        Thought Content: Thought content normal.        Judgment: Judgment normal.     Data: IMPRESSION: Color duplex indicates moderate heterogeneous and calcified plaque, with no hemodynamically significant stenosis by duplex criteria in the extracranial cerebrovascular circulation.   Asymmetric upper extremity blood pressures, lower on the left. If there is concern for symptoms of the left upper extremity, correlation with office based upper extremity blood pressure assessment may be useful.   Incidental thyroid nodules. Dedicated thyroid ultrasound may be performed as needed for further characterisation.       Assessment/Plan:    82 year old female found to have atherosclerotic plaque in her bilateral  carotid arteries which is nonhemodynamically significant she does take aspirin and a statin and remains asymptomatic.  Will follow-up in 3 years with repeat carotid duplex.  We discussed the signs and symptoms of stroke and she demonstrates good understanding.  There was some concern  of asymptomatic upper extremity blood pressure discrepancy previously but this was not identified today.     Waynetta Sandy MD [facility]

## 2022-06-20 ENCOUNTER — Ambulatory Visit
Admission: RE | Admit: 2022-06-20 | Discharge: 2022-06-20 | Disposition: A | Discharge status: Home / Self Care | Payer: Medicare Other | Source: Ambulatory Visit | Attending: Family Medicine | Admitting: Family Medicine

## 2022-06-20 DIAGNOSIS — E041 Nontoxic single thyroid nodule: Secondary | ICD-10-CM | POA: Diagnosis not present

## 2022-06-26 ENCOUNTER — Other Ambulatory Visit: Payer: Self-pay | Admitting: Family Medicine

## 2022-06-26 DIAGNOSIS — E041 Nontoxic single thyroid nodule: Secondary | ICD-10-CM

## 2022-07-10 ENCOUNTER — Other Ambulatory Visit: Payer: Self-pay | Admitting: Family Medicine

## 2022-07-10 DIAGNOSIS — E041 Nontoxic single thyroid nodule: Secondary | ICD-10-CM

## 2022-07-18 ENCOUNTER — Ambulatory Visit: Payer: Medicare Other | Admitting: Interventional Cardiology

## 2022-07-23 ENCOUNTER — Encounter: Payer: Self-pay | Admitting: Otolaryngology

## 2022-07-23 ENCOUNTER — Inpatient Hospital Stay: Admission: RE | Admit: 2022-07-23 | Payer: Medicare Other | Source: Ambulatory Visit

## 2022-08-06 ENCOUNTER — Ambulatory Visit
Admission: RE | Admit: 2022-08-06 | Discharge: 2022-08-06 | Disposition: A | Discharge status: Home / Self Care | Payer: Medicare Other | Source: Ambulatory Visit | Attending: Family Medicine | Admitting: Family Medicine

## 2022-08-06 ENCOUNTER — Other Ambulatory Visit (HOSPITAL_COMMUNITY)
Admission: RE | Admit: 2022-08-06 | Discharge: 2022-08-06 | Disposition: A | Discharge status: Home / Self Care | Payer: Medicare Other | Source: Ambulatory Visit | Attending: Interventional Radiology | Admitting: Interventional Radiology

## 2022-08-06 DIAGNOSIS — E041 Nontoxic single thyroid nodule: Secondary | ICD-10-CM

## 2022-08-06 DIAGNOSIS — E042 Nontoxic multinodular goiter: Secondary | ICD-10-CM | POA: Insufficient documentation

## 2022-08-06 DIAGNOSIS — E049 Nontoxic goiter, unspecified: Secondary | ICD-10-CM | POA: Diagnosis not present

## 2022-08-08 LAB — CYTOLOGY - NON PAP

## 2022-08-10 DIAGNOSIS — E042 Nontoxic multinodular goiter: Secondary | ICD-10-CM | POA: Diagnosis not present

## 2022-08-14 NOTE — Progress Notes (Deleted)
Cardiology Office Note:    Date:  08/14/2022   ID:  Tiffany Kane, DOB Mar 10, 1941, MRN [id number]  PCP:  Saintclair Halsted, [facility] Cardiologist:  Larae Grooms, MD { Click to update primary MD,subspecialty MD or APP then REFRESH:1}  *** Referring MD: Glenis Smoker, *   Chief Complaint:  No chief complaint on file. {Click here for Visit Info    :1}    History of Present Illness:   Tiffany Kane is a 82 y.o. female with  history of  thoracic aneurysm. It was stable in size by MRA in 2011. MRA in 3/17 showed a diameter of 4.4 cm of the thoracic aorta, 4.2 cm on PET CT 05/2021, HTN, HLD, CAD nonobstructive on cath 2001, negative stress test 2010.   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 She has not had a documented blood pressure difference between the 2 arms at any prior cardiology visit.  No weakness in her arms.  No problems holding items.  No difference in home BP readings between arms."  Echo 05/2021 EF 55-60%, mild MR  Last saw Dr. Irish Lack 07/2021 and mild DOE.       Past Medical History:  Diagnosis Date   Anxiety    Arthritis    Breast cancer ([place]) 2002   DUCTAL IN SITU.Marland Kitchen1999   Bronchitis    Compression fracture    T-11   Coronary artery disease    Depression    Depression    situational stress   Esophageal reflux    H/O ascites 09/2004   High cholesterol    Hyperlipemia    Hyperlipidemia    Hypertension    Osteopenia    Osteoporosis 03/2006   Pneumonia    Thoracic aneurysm    DR. Leonia Reeves   Thyromegaly    Current Medications: No outpatient medications have been marked as taking for the 08/28/22 encounter (Appointment) with Imogene Burn, PA-C.    Allergies:   Demerol [meperidine], Alendronate, Meloxicam, Zoledronic acid, Prednisone, and Zoloft [sertraline hcl]   Social History   Tobacco Use   Smoking status: Former    Packs/day: 0.50    Years: 6.00     Additional pack years: 0.00    Total pack years: 3.00    Types: Cigarettes    Quit date: 05/14/1992    Years since quitting: 30.2   Smokeless tobacco: Never  Vaping Use   Vaping Use: Never used  Substance Use Topics   Alcohol use: Yes    Comment: RARELY /once year    Drug use: No    Family Hx: The patient's family history includes Bone cancer in her father; Breast cancer in her mother; Diabetes in her mother and sister; Heart attack in her brother; Heart disease in her brother; Hypertension in her mother and sister; Prostate cancer in her brother and father.  ROS     Physical Exam:    VS:  There were no vitals taken for this visit.    Wt Readings from Last 3 Encounters:  06/06/22 163 lb (73.9 kg)  03/07/22 161 lb (73 kg)  08/09/21 156 lb (70.8 kg)    Physical Exam  GEN: Well nourished, well developed, in no acute distress  HEENT: normal  Neck: no JVD, carotid bruits, or masses Cardiac:RRR; no murmurs, rubs, or gallops  Respiratory:  clear to auscultation bilaterally, normal work of breathing GI: soft, nontender, nondistended, +  BS Ext: without cyanosis, clubbing, or edema, Good distal pulses bilaterally MS: no deformity or atrophy  Skin: warm and dry, no rash Neuro:  Alert and Oriented x 3, Strength and sensation are intact Psych: euthymic mood, full affect        EKGs/Labs/Other Test Reviewed:    EKG:  EKG is *** ordered today.  The ekg ordered today demonstrates ***  Recent Labs: 12/07/2021: ALT 10   Recent Lipid Panel Recent Labs    12/07/21 0925  CHOL 173  TRIG 116  HDL 71  LDLCALC 82     Prior CV Studies: {Select studies to display:26339}   Echo 2021/06/18 06-18-21 for ILD.    Echo showed: "Left ventricular ejection fraction, by estimation, is 55 to 60%. The  left ventricle has normal function. The left ventricle has no regional  wall motion abnormalities. Left ventricular diastolic parameters are  indeterminate.   2. Right ventricular systolic  function is normal. The right ventricular  size is normal.   3. The mitral valve is normal in structure. Mild mitral valve  regurgitation.   4. Tricuspid valve regurgitation is mild to moderate.   5. The aortic valve is tricuspid. Aortic valve regurgitation is trivial.  Aortic valve sclerosis is present, with no evidence of aortic valve  stenosis.   6. The inferior vena cava dilated size. "     Risk Assessment/Calculations/Metrics:   {Does this patient have ATRIAL FIBRILLATION?:[phone]}     No BP recorded.  {Refresh Note OR Click here to enter BP  :1}***    ASSESSMENT & PLAN:   No problem-specific Assessment & Plan notes found for this encounter.    Thoracic aneurysm: The PE CT scan in 18-Jun-2021 showed the size was 4.2 cm, unchanged from 2019.  CT 08/20/22: Her blood pressure has been well controlled.  The lack of change in size in the aorta is reassuring.  Will not plan for any dedicated imaging of the aorta.  She has chest CTs for her lungs frequently enough that that may be sufficient.  In addition given the lack of change and her age, screening may not be that high yield.  Hypertension:The current medical regimen is effective;  continue present plan and medications.  Hyperlipidemia: HDL 66, LDL 109, total cholesterol 200, triglycerides 143 in July 2022.  Whole food, plant-based diet.  Avoid processed food.  High-fiber diet.  Stop simvastatin, start rosuvastatin 20 mg daily.  Will check lipids and liver tests in about 2 to 3 months.  Less side effects with rosuvastatin compared to simvastatin.  Also, better prevention and lipid-lowering with the rosuvastatin.  CAD: Mild, nonobstructive disease by cath in 2001.  Negative stress test in 2010. No complaints regarding strength in her arms as there was a question of subclavian stenosis in the past. No syncope.  Stay well hydrated.            {Are you ordering a CV Procedure (e.g. stress test, cath, DCCV, TEE, etc)?   Press F2         :[id number]   Dispo:  No follow-ups on file.   Medication Adjustments/Labs and Tests Ordered: Current medicines are reviewed at length with the patient today.  Concerns regarding medicines are outlined above.  Tests Ordered: No orders of the defined types were placed in this encounter.  Medication Changes: No orders of the defined types were placed in this encounter.  Signed, Ermalinda Barrios, PA-C  08/14/2022 1:46 PM    [facility]  [address] [place], [place]  [address] Phone: [phone]; Fax: [phone]

## 2022-08-20 ENCOUNTER — Ambulatory Visit
Admission: RE | Admit: 2022-08-20 | Discharge: 2022-08-20 | Disposition: A | Discharge status: Home / Self Care | Payer: Medicare Other | Source: Ambulatory Visit | Attending: Emergency Medicine | Admitting: Emergency Medicine

## 2022-08-20 DIAGNOSIS — K449 Diaphragmatic hernia without obstruction or gangrene: Secondary | ICD-10-CM | POA: Diagnosis not present

## 2022-08-20 DIAGNOSIS — I700 Atherosclerosis of aorta: Secondary | ICD-10-CM | POA: Diagnosis not present

## 2022-08-20 DIAGNOSIS — J479 Bronchiectasis, uncomplicated: Secondary | ICD-10-CM | POA: Diagnosis not present

## 2022-08-20 DIAGNOSIS — J84112 Idiopathic pulmonary fibrosis: Secondary | ICD-10-CM | POA: Diagnosis not present

## 2022-08-20 DIAGNOSIS — J849 Interstitial pulmonary disease, unspecified: Secondary | ICD-10-CM

## 2022-08-27 ENCOUNTER — Encounter (HOSPITAL_COMMUNITY): Payer: Self-pay

## 2022-08-28 ENCOUNTER — Ambulatory Visit: Payer: Medicare Other | Admitting: Physician Assistant

## 2022-09-06 ENCOUNTER — Ambulatory Visit: Payer: Self-pay | Admitting: Surgery

## 2022-09-06 DIAGNOSIS — E042 Nontoxic multinodular goiter: Secondary | ICD-10-CM | POA: Diagnosis not present

## 2022-09-06 DIAGNOSIS — D440 Neoplasm of uncertain behavior of thyroid gland: Secondary | ICD-10-CM | POA: Diagnosis not present

## 2022-09-12 NOTE — Progress Notes (Signed)
COVID Vaccine received:  []  No [x]  Yes Date of any COVID positive Test in last 90 days:  PCP - Jorge Ny FNP at [facility]  [phone] Cardiologist - Everette Rank, MD Pulmonology-  Levy Pupa, MD Rheumatology - Zenovia Jordan, MD  Chest x-ray - CT CHEST-  08-20-2022  Epic EKG - 05-18-2021  Epic    will repeat at PST  Stress Test -  ECHO - 05-18-2021  Epic Cardiac Cath - 2001   Epic  PCR screen: []  Ordered & Completed           []   No Order but Needs PROFEND           [x]   N/A for this surgery  Surgery Plan:  []  Ambulatory                            [x]  Outpatient in bed                            []  Admit  Anesthesia:    [x]  General  []  Spinal                           []   Choice []   MAC  Pacemaker / ICD device [x]  No []  Yes   Spinal Cord Stimulator:[x]  No []  Yes       History of Sleep Apnea? [x]  No []  Yes   CPAP used?- [x]  No []  Yes    Does the patient monitor blood sugar?          []  No []  Yes  [x]  N/A  Patient has: [x]  NO Hx DM   []  Pre-DM                 []  DM1  []   DM2 Does patient have a [employer] or [employer]? []  No []  Yes   Fasting Blood Sugar Ranges-  Checks Blood Sugar _____ times a day  Blood Thinner / Instructions:  none Aspirin Instructions:  ASA 81 mg  ERAS Protocol Ordered: []  No  [x]  Yes PRE-SURGERY []  ENSURE  []  G2  [x]  No Drink Ordered Patient is to be NPO after: 09:00 am  Comments:   Activity level: Patient is able / unable to climb a flight of stairs without difficulty; []  No CP  []  No SOB, but would have ___   Patient can / can not perform ADLs without assistance.   Anesthesia review: HTN, Anxiety, ILD- pulmonary fibrosis, TAA, GERD, RA, Mild to Mod. TR on ECHO 05-18-2021, Hx Breast Cancer  Patient denies shortness of breath, fever, cough and chest pain at PAT appointment.  Patient verbalized understanding and agreement to the Pre-Surgical Instructions that were given to them at this PAT appointment. Patient was also educated of  the need to review these PAT instructions again prior to her surgery.I reviewed the appropriate phone numbers to call if they have any and questions or concerns.

## 2022-09-12 NOTE — Patient Instructions (Signed)
SURGICAL WAITING ROOM VISITATION Patients having surgery or a procedure may have no more than 2 support people in the waiting area - these visitors may rotate in the visitor waiting room.   Due to an increase in RSV and influenza rates and associated hospitalizations, children ages 69 and under may not visit patients in [facility] hospitals. If the patient needs to stay at the hospital during part of their recovery, the visitor guidelines for inpatient rooms apply.  PRE-OP VISITATION  Pre-op nurse will coordinate an appropriate time for 1 support person to accompany the patient in pre-op.  This support person may not rotate.  This visitor will be contacted when the time is appropriate for the visitor to come back in the pre-op area.  Please refer to the [facility] website for the visitor guidelines for Inpatients (after your surgery is over and you are in a regular room).  You are not required to quarantine at this time prior to your surgery. However, you must do this: Hand Hygiene often Do NOT share personal items Notify your provider if you are in close contact with someone who has COVID or you develop fever 100.4 or greater, new onset of sneezing, cough, sore throat, shortness of breath or body aches.  If you test positive for Covid or have been in contact with anyone that has tested positive in the last 10 days please notify you surgeon.    Your procedure is scheduled on:  Friday  Sep 21, 2022  Report to [facility] Main Entrance: [facility]sen entrance where the [employer] is available.   Report to admitting at:   09:45  AM  +++++Call this number if you have any questions or problems the morning of surgery [phone]  Do not eat food after Midnight the night prior to your surgery/procedure.  After Midnight you may have the following liquids until   09:00  AM DAY OF SURGERY  Clear Liquid Diet Water Black Coffee (sugar ok, NO MILK/CREAM OR CREAMERS)  Tea (sugar ok, NO  MILK/CREAM OR CREAMERS) regular and decaf                             Plain Jell-O  with no fruit (NO RED)                                           Fruit ices (not with fruit pulp, NO RED)                                     Popsicles (NO RED)                                                                  Juice: apple, WHITE grape, WHITE cranberry Sports drinks like Gatorade or Powerade (NO RED)                FOLLOW ANY ADDITIONAL PRE OP INSTRUCTIONS YOU RECEIVED FROM YOUR SURGEON'S OFFICE!!!   Oral Hygiene is also important to reduce your risk of infection.  Remember - BRUSH YOUR TEETH THE MORNING OF SURGERY WITH YOUR REGULAR TOOTHPASTE  Do NOT smoke after Midnight the night before surgery.  Take ONLY these medicines the morning of surgery with A SIP OF WATER: escitalopram (Lexapro), metoprolol   You may not have any metal on your body including hair pins, jewelry, and body piercing  Do not wear make-up, lotions, powders, perfumes  or deodorant  Do not wear nail polish including gel and S&S, artificial / acrylic nails, or any other type of covering on natural nails including finger and toenails. If you have artificial nails, gel coating, etc., that needs to be removed by a nail salon, Please have this removed prior to surgery. Not doing so may mean that your surgery could be cancelled or delayed if the Surgeon or anesthesia staff feels like they are unable to monitor you safely.   Do not shave 48 hours prior to surgery to avoid nicks in your skin which may contribute to postoperative infections.    Contacts, Hearing Aids, dentures or bridgework may not be worn into surgery. DENTURES WILL BE REMOVED PRIOR TO SURGERY PLEASE DO NOT APPLY "Poly grip" OR ADHESIVES!!!  You may bring a small overnight bag with you on the day of surgery, only pack items that are not valuable. [facility] IS NOT RESPONSIBLE   FOR VALUABLES THAT ARE LOST OR STOLEN.   Do not bring your home  medications to the hospital. The Pharmacy will dispense medications listed on your medication list to you during your admission in the Hospital.  Special Instructions: Bring a copy of your healthcare power of attorney and living will documents the day of surgery, if you wish to have them scanned into your [facility]- EPIC  Please read over the following fact sheets you were given: IF YOU HAVE QUESTIONS ABOUT YOUR PRE-OP INSTRUCTIONS, PLEASE CALL [phone].   [place] - Preparing for Surgery Before surgery, you can play an important role.  Because skin is not sterile, your skin needs to be as free of germs as possible.  You can reduce the number of germs on your skin by washing with CHG (chlorahexidine gluconate) soap before surgery.  CHG is an antiseptic cleaner which kills germs and bonds with the skin to continue killing germs even after washing. Please DO NOT use if you have an allergy to CHG or antibacterial soaps.  If your skin becomes reddened/irritated stop using the CHG and inform your nurse when you arrive at Short Stay. Do not shave (including legs and underarms) for at least 48 hours prior to the first CHG shower.  You may shave your face/neck.  Please follow these instructions carefully:  1.  Shower with CHG Soap the night before surgery and the  morning of surgery.  2.  If you choose to wash your hair, wash your hair first as usual with your normal  shampoo.  3.  After you shampoo, rinse your hair and body thoroughly to remove the shampoo.                             4.  Use CHG as you would any other liquid soap.  You can apply chg directly to the skin and wash.  Gently with a scrungie or clean washcloth.  5.  Apply the CHG Soap to your body ONLY FROM THE NECK DOWN.   Do not use on face/ open  Wound or open sores. Avoid contact with eyes, ears mouth and genitals (private parts).                       Wash face,  Genitals (private parts)  with your normal soap.             6.  Wash thoroughly, paying special attention to the area where your  surgery  will be performed.  7.  Thoroughly rinse your body with warm water from the neck down.  8.  DO NOT shower/wash with your normal soap after using and rinsing off the CHG Soap.            9.  Pat yourself dry with a clean towel.            10.  Wear clean pajamas.            11.  Place clean sheets on your bed the night of your first shower and do not  sleep with pets.  ON THE DAY OF SURGERY : Do not apply any lotions/deodorants the morning of surgery.  Please wear clean clothes to the hospital/surgery center.    FAILURE TO FOLLOW THESE INSTRUCTIONS MAY RESULT IN THE CANCELLATION OF YOUR SURGERY  PATIENT SIGNATURE_________________________________  NURSE SIGNATURE__________________________________  ________________________________________________________________________

## 2022-09-13 ENCOUNTER — Encounter (HOSPITAL_COMMUNITY)
Admission: RE | Admit: 2022-09-13 | Discharge: 2022-09-13 | Disposition: A | Discharge status: Home / Self Care | Payer: Medicare Other | Source: Ambulatory Visit | Attending: Surgery | Admitting: Surgery

## 2022-09-13 ENCOUNTER — Other Ambulatory Visit: Payer: Self-pay

## 2022-09-13 ENCOUNTER — Encounter (HOSPITAL_COMMUNITY): Payer: Self-pay | Admitting: Surgery

## 2022-09-13 ENCOUNTER — Encounter (HOSPITAL_COMMUNITY): Payer: Self-pay

## 2022-09-13 VITALS — BP 144/70 | HR 65 | Temp 98.4°F | Resp 16 | Ht 60.0 in | Wt 159.0 lb

## 2022-09-13 DIAGNOSIS — D440 Neoplasm of uncertain behavior of thyroid gland: Secondary | ICD-10-CM | POA: Diagnosis not present

## 2022-09-13 DIAGNOSIS — Z853 Personal history of malignant neoplasm of breast: Secondary | ICD-10-CM | POA: Diagnosis not present

## 2022-09-13 DIAGNOSIS — J849 Interstitial pulmonary disease, unspecified: Secondary | ICD-10-CM | POA: Diagnosis not present

## 2022-09-13 DIAGNOSIS — I2510 Atherosclerotic heart disease of native coronary artery without angina pectoris: Secondary | ICD-10-CM | POA: Diagnosis not present

## 2022-09-13 DIAGNOSIS — E042 Nontoxic multinodular goiter: Secondary | ICD-10-CM | POA: Diagnosis not present

## 2022-09-13 DIAGNOSIS — Z01818 Encounter for other preprocedural examination: Secondary | ICD-10-CM | POA: Insufficient documentation

## 2022-09-13 DIAGNOSIS — Z87891 Personal history of nicotine dependence: Secondary | ICD-10-CM | POA: Diagnosis not present

## 2022-09-13 DIAGNOSIS — I10 Essential (primary) hypertension: Secondary | ICD-10-CM | POA: Diagnosis not present

## 2022-09-13 DIAGNOSIS — I7121 Aneurysm of the ascending aorta, without rupture: Secondary | ICD-10-CM | POA: Insufficient documentation

## 2022-09-13 HISTORY — DX: Thoracic aortic aneurysm, without rupture, unspecified: I71.20

## 2022-09-13 LAB — CBC
HCT: 41.8 % (ref 36.0–46.0)
Hemoglobin: 14 g/dL (ref 12.0–15.0)
MCH: 30.3 pg (ref 26.0–34.0)
MCHC: 33.5 g/dL (ref 30.0–36.0)
MCV: 90.5 fL (ref 80.0–100.0)
Platelets: 204 10*3/uL (ref 150–400)
RBC: 4.62 MIL/uL (ref 3.87–5.11)
RDW: 12.7 % (ref 11.5–15.5)
WBC: 6.6 10*3/uL (ref 4.0–10.5)
nRBC: 0 % (ref 0.0–0.2)

## 2022-09-13 LAB — BASIC METABOLIC PANEL
Anion gap: 9 (ref 5–15)
BUN: 18 mg/dL (ref 8–23)
CO2: 25 mmol/L (ref 22–32)
Calcium: 9.3 mg/dL (ref 8.9–10.3)
Chloride: 102 mmol/L (ref 98–111)
Creatinine, Ser: 0.83 mg/dL (ref 0.44–1.00)
GFR, Estimated: >60 mL/min (ref >60)
Glucose, Bld: 101 mg/dL — ABNORMAL HIGH (ref 70–99)
Potassium: 3.8 mmol/L (ref 3.5–5.1)
Sodium: 136 mmol/L (ref 135–145)

## 2022-09-13 NOTE — H&P (Signed)
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 Patient had undergone evaluation for dysphagia. Incidental finding was made of thyroid nodules. Patient underwent a thyroid ultrasound on June 20, 2022. This showed a mildly enlarged right thyroid lobe with 3 nodules. 2 nodules underwent fine-needle aspiration biopsy. The right mid 2.7 cm nodule returned with a result of atypia. Specimen was submitted for molecular genetic testing with [facility]. This returned with a result of suspicious, rendering a risk of malignancy of 50%. Patient is now referred for consideration of resection for definitive diagnosis and management. Patient has no prior history of thyroid disease. She has never been on thyroid medication. There is a family history of thyroid disease in the patient's sister. She did have thyroid surgery but it is not known whether or not she had thyroid malignancy. Patient has not had any further episodes of dysphagia. She is accompanied today by a friend who takes notes and is available to ask questions.  Review of Systems: A complete review of systems was obtained from the patient. I have reviewed this information and discussed as appropriate with the patient. See HPI as well for other ROS.  Review of Systems Constitutional: Negative. HENT: Negative. Eyes: Negative. Respiratory: Negative. Cardiovascular: Negative. Gastrointestinal: Dysphagia Genitourinary: Negative. Musculoskeletal: Negative. Skin: Negative. Neurological: Negative. Endo/Heme/Allergies: Negative. Psychiatric/Behavioral: Negative.   Medical History: History reviewed. No pertinent  past medical history.  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 No pertinent surgical history.  No Known Allergies  No current outpatient medications on file prior to visit.  No current facility-administered medications on file prior to visit.  History reviewed. No pertinent family history.  Social History  Tobacco Use Smoking Status Not on file Smokeless Tobacco Not on file   Social History  Socioeconomic History Marital status: Widowed  Objective:  Vitals: BP: 110/70 Pulse: 89 Temp: 36.7 C (98.1 F) SpO2: 99% Weight: 73.1 kg (161 lb 1.6 oz) Height: 152.4 cm (5') PainSc: 0-No pain  Body mass index is 31.46 kg/m.  Physical Exam  GENERAL APPEARANCE Comfortable, no acute issues Development: normal Gross deformities: none  SKIN Rash, lesions, ulcers: none Induration, erythema: none Nodules: none palpable  EYES Conjunctiva and lids: normal Pupils: equal and reactive  EARS, NOSE, MOUTH, THROAT External ears: no lesion or deformity External nose: no lesion or deformity Hearing: grossly normal  NECK Symmetric: yes Trachea: midline Thyroid: Left thyroid lobe is without palpable abnormality. Right thyroid lobe has a relatively firm smooth mobile nodule in the mid pole measuring approximately 3 cm in size, nontender. There is no associated lymphadenopathy.  CHEST Respiratory effort: normal Retraction or accessory muscle use: no Breath sounds: normal bilaterally Rales, rhonchi, wheeze: none  CARDIOVASCULAR Auscultation: regular rhythm, normal rate Murmurs: none Pulses: radial pulse 2+ palpable Lower extremity edema: none  ABDOMEN Not assessed  GENITOURINARY/RECTAL Not assessed  MUSCULOSKELETAL Station and gait: normal Digits and nails: no clubbing or cyanosis Muscle strength: grossly normal all extremities Range of motion: grossly normal all  extremities Deformity: none  LYMPHATIC Cervical: none palpable Supraclavicular: none palpable  PSYCHIATRIC Oriented to person, place, and time: yes Mood and affect: normal for situation  Judgment and insight: appropriate for situation   Assessment and Plan:  Multiple thyroid nodules Neoplasm of uncertain behavior of thyroid gland  Patient is referred by her primary care provider for surgical evaluation and management of multiple thyroid nodules with a thyroid neoplasm of uncertain behavior.  Patient provided with a copy of "The Thyroid Book: Medical and Surgical Treatment of Thyroid Problems", published by [employer], 16 pages. Book reviewed and explained to patient during visit today.  Today we reviewed her clinical history. We reviewed her ultrasound report. We reviewed the cytopathology report and the molecular genetic testing results. Patient has multiple nodules in the right thyroid lobe. Dominant nodule in the mid right thyroid lobe measuring 2.7 cm has cytologic atypia and on molecular genetic testing has a risk of malignancy of 50%. I have recommended proceeding with right thyroid lobectomy. We discussed the procedure today. We discussed the risk and benefits including the risk of recurrent laryngeal nerve injury and injury to parathyroid glands. We discussed the size and location of the surgical incision. We discussed the hospital stay to be anticipated. We discussed the potential need for additional surgery or for radioactive iodine treatment. We discussed the potential need for lifelong thyroid hormone replacement therapy. The patient understands and wishes to proceed with surgery in the near future.  Darnell Level, MD [facility] practice Office: [phone]

## 2022-09-14 NOTE — Anesthesia Preprocedure Evaluation (Addendum)
Anesthesia Evaluation  Patient identified by MRN, date of birth, ID band Patient awake    Reviewed: Allergy & Precautions, NPO status , Patient's Chart, lab work & pertinent test results  Airway Mallampati: I  TM Distance: >3 FB Neck ROM: Full    Dental  (+) Edentulous Upper, Edentulous Lower   Pulmonary former smoker   breath sounds clear to auscultation       Cardiovascular hypertension, Pt. on home beta blockers and Pt. on medications + CAD   Rhythm:Regular Rate:Normal     Neuro/Psych  PSYCHIATRIC DISORDERS Anxiety Depression       GI/Hepatic Neg liver ROS,GERD  ,,  Endo/Other  negative endocrine ROS    Renal/GU negative Renal ROS     Musculoskeletal  (+) Arthritis ,    Abdominal   Peds  Hematology negative hematology ROS (+)   Anesthesia Other Findings   Reproductive/Obstetrics                             Anesthesia Physical Anesthesia Plan  ASA: 3  Anesthesia Plan: General   Post-op Pain Management: Tylenol PO (pre-op)*   Induction: Intravenous  PONV Risk Score and Plan: 4 or greater and Ondansetron and Treatment may vary due to age or medical condition  Airway Management Planned: Oral ETT  Additional Equipment: None  Intra-op Plan:   Post-operative Plan: Extubation in OR  Informed Consent: I have reviewed the patients History and Physical, chart, labs and discussed the procedure including the risks, benefits and alternatives for the proposed anesthesia with the patient or authorized representative who has indicated his/her understanding and acceptance.     Dental advisory given  Plan Discussed with: CRNA  Anesthesia Plan Comments: (See PAT note 09/13/2022)       Anesthesia Quick Evaluation

## 2022-09-14 NOTE — Progress Notes (Signed)
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 Darnell Level  Patient followed by vascular surgeon for carotid disease, ascending aortic aneurysm.  Last seen 06/06/2022.  Per office visit note, "bilateral carotid arteries which is nonhemodynamically significant she does take aspirin and a statin and remains asymptomatic. Will follow-up in 3 years with repeat carotid duplex."  Patient follows pulmonology for interstitial lung disease.  Last seen on 03/07/2022.  Per office visit note stable at this visit.  Chest CT 08/20/2022 with no interval progression of ILD since 07/18/2021 CT.  Ascending thoracic aorta 4.4 cm.  Patient last seen by cardiology 08/09/2021.  Followed for thoracic aneurysm.  Per office visit note, "The PE CT scan in January 2023 showed the size was 4.2 cm, unchanged from 2019.  Her blood pressure has been well controlled.  The lack of change in size in the aorta is reassuring.  Will not plan for any dedicated imaging of the aorta.  She has chest CTs for her lungs frequently enough that that may be sufficient.  In addition given the lack of change and her age, screening may not be that high yield."  Patient with mild nonobstructive CAD on cath 2001, negative stress test 2010. VS: BP (!) 144/70 Comment: right arm sitting  Pulse 65   Temp 36.9 C (Oral)   Resp 16   Ht 5' (1.524 m)   Wt 72.1 kg   SpO2 100%   BMI 31.05 kg/m   PROVIDERS: Camie Patience, FNP is  PCP  Cardiologist - Everette Rank, MD  Pulmonology-  Levy Pupa, MD LABS: Labs reviewed: Acceptable for surgery. (all labs ordered are listed, but only abnormal results are displayed)  Labs Reviewed  BASIC METABOLIC PANEL - Abnormal; Notable for the following components:      Result Value   Glucose, Bld 101 (*)    All other components within normal limits  CBC     IMAGES:   EKG:   CV: Echo 05/18/2020 1. Left ventricular ejection fraction, by estimation, is 55 to 60%. The  left ventricle has normal function. The left ventricle has no regional  wall motion abnormalities. Left ventricular diastolic parameters are  indeterminate.   2. Right ventricular systolic function is normal. The right ventricular  size is normal.   3. The mitral valve is normal in structure. Mild mitral valve  regurgitation.   4. Tricuspid valve regurgitation is mild to moderate.   5. The aortic valve is tricuspid. Aortic valve regurgitation is trivial.  Aortic valve sclerosis is present, with no evidence of aortic valve  stenosis.   6. The inferior vena cava dilated size.  Past Medical History:  Diagnosis Date   Anxiety    Arthritis    Breast cancer (HCC) 05/14/2000   DUCTAL IN SITU.Marland Kitchen1999   Bronchitis    Compression fracture    T-11   Coronary artery disease    Depression    Depression    situational stress   Esophageal reflux    H/O  ascites 09/11/2004   High cholesterol    Hyperlipemia    Hyperlipidemia    Hypertension    Osteopenia    Osteoporosis 03/14/2006   Pneumonia    Thoracic aneurysm    DR. Amil Amen   Thoracic aortic aneurysm (HCC)    currently 4.4 cm   Thyromegaly     Past Surgical History:  Procedure Laterality Date   ABDOMINAL HYSTERECTOMY     BREAST BIOPSY Left    CARDIAC CATHETERIZATION  06/15/1999   CATARACT EXTRACTION Right    CERVICAL SPINE SURGERY     sympathectomy   FRACTURE SURGERY Right 2000   right lower leg   VIDEO BRONCHOSCOPY  05/26/2012    Procedure: VIDEO BRONCHOSCOPY WITH FLUORO;  Surgeon: Leslye Peer, MD;  Location: [facility];  Service: Cardiopulmonary;  Laterality: Bilateral;    MEDICATIONS:  ALPRAZolam (XANAX) 0.25 MG tablet   aspirin EC 81 MG EC tablet   escitalopram (LEXAPRO) 10 MG tablet   folic acid (FOLVITE) 1 MG tablet   hydroxychloroquine (PLAQUENIL) 200 MG tablet   metoprolol succinate (TOPROL-XL) 50 MG 24 hr tablet   rosuvastatin (CRESTOR) 20 MG tablet   triamterene-hydrochlorothiazide (MAXZIDE-25) 37.5-25 MG per tablet   zinc gluconate 50 MG tablet   No current facility-administered medications for this encounter.    Jodell Cipro Ward, PA-C [facility] Pre-Surgical Testing [phone]

## 2022-09-21 ENCOUNTER — Ambulatory Visit (HOSPITAL_BASED_OUTPATIENT_CLINIC_OR_DEPARTMENT_OTHER): Payer: Medicare Other | Admitting: Anesthesiology

## 2022-09-21 ENCOUNTER — Ambulatory Visit (HOSPITAL_COMMUNITY): Payer: Medicare Other | Admitting: Physician Assistant

## 2022-09-21 ENCOUNTER — Encounter (HOSPITAL_COMMUNITY): Payer: Self-pay | Admitting: Surgery

## 2022-09-21 ENCOUNTER — Encounter (HOSPITAL_COMMUNITY)
Admission: RE | Disposition: A | Discharge status: Home / Self Care | Payer: Self-pay | Source: Ambulatory Visit | Attending: Surgery

## 2022-09-21 ENCOUNTER — Ambulatory Visit (HOSPITAL_COMMUNITY)
Admission: RE | Admit: 2022-09-21 | Discharge: 2022-09-22 | Disposition: A | Discharge status: Home / Self Care | Payer: Medicare Other | Source: Ambulatory Visit | Attending: Surgery | Admitting: Surgery

## 2022-09-21 ENCOUNTER — Other Ambulatory Visit: Payer: Self-pay

## 2022-09-21 DIAGNOSIS — I10 Essential (primary) hypertension: Secondary | ICD-10-CM | POA: Diagnosis not present

## 2022-09-21 DIAGNOSIS — F418 Other specified anxiety disorders: Secondary | ICD-10-CM | POA: Insufficient documentation

## 2022-09-21 DIAGNOSIS — F32A Depression, unspecified: Secondary | ICD-10-CM | POA: Insufficient documentation

## 2022-09-21 DIAGNOSIS — Z87891 Personal history of nicotine dependence: Secondary | ICD-10-CM | POA: Diagnosis not present

## 2022-09-21 DIAGNOSIS — I2510 Atherosclerotic heart disease of native coronary artery without angina pectoris: Secondary | ICD-10-CM | POA: Insufficient documentation

## 2022-09-21 DIAGNOSIS — D34 Benign neoplasm of thyroid gland: Secondary | ICD-10-CM | POA: Diagnosis not present

## 2022-09-21 DIAGNOSIS — D440 Neoplasm of uncertain behavior of thyroid gland: Secondary | ICD-10-CM

## 2022-09-21 DIAGNOSIS — E042 Nontoxic multinodular goiter: Secondary | ICD-10-CM | POA: Diagnosis not present

## 2022-09-21 HISTORY — PX: THYROID LOBECTOMY: SHX420

## 2022-09-21 SURGERY — LOBECTOMY, THYROID
Anesthesia: General | Site: Neck | Laterality: Right

## 2022-09-21 MED ORDER — FENTANYL CITRATE PF 50 MCG/ML IJ SOSY
25.0000 ug | PREFILLED_SYRINGE | INTRAMUSCULAR | Status: DC | PRN
  Administered 2022-09-21 (×3): 25 ug via INTRAVENOUS

## 2022-09-21 MED ORDER — ONDANSETRON HCL 4 MG/2ML IJ SOLN: 4.0000 mg | Freq: Four times a day (QID) | INTRAMUSCULAR | Status: DC | PRN

## 2022-09-21 MED ORDER — DEXAMETHASONE SODIUM PHOSPHATE 10 MG/ML IJ SOLN
INTRAMUSCULAR | Status: AC
  Filled 2022-09-21: qty 1

## 2022-09-21 MED ORDER — ACETAMINOPHEN 325 MG PO TABS: 325.0000 mg | ORAL_TABLET | ORAL | Status: DC | PRN

## 2022-09-21 MED ORDER — 0.9 % SODIUM CHLORIDE (POUR BTL) OPTIME
TOPICAL | Status: DC | PRN
  Administered 2022-09-21: 1000 mL

## 2022-09-21 MED ORDER — FENTANYL CITRATE (PF) 100 MCG/2ML IJ SOLN
INTRAMUSCULAR | Status: DC | PRN
  Administered 2022-09-21 (×4): 50 ug via INTRAVENOUS

## 2022-09-21 MED ORDER — OXYCODONE HCL 5 MG PO TABS: 5.0000 mg | ORAL_TABLET | ORAL | Status: DC | PRN

## 2022-09-21 MED ORDER — DEXAMETHASONE SODIUM PHOSPHATE 4 MG/ML IJ SOLN
INTRAMUSCULAR | Status: DC | PRN
  Administered 2022-09-21: 4 mg via INTRAVENOUS

## 2022-09-21 MED ORDER — ACETAMINOPHEN 10 MG/ML IV SOLN: 1000.0000 mg | Freq: Once | INTRAVENOUS | Status: DC | PRN

## 2022-09-21 MED ORDER — EPHEDRINE 5 MG/ML INJ
INTRAVENOUS | Status: AC
  Filled 2022-09-21: qty 5

## 2022-09-21 MED ORDER — SUGAMMADEX SODIUM 200 MG/2ML IV SOLN
INTRAVENOUS | Status: DC | PRN
  Administered 2022-09-21: 200 mg via INTRAVENOUS

## 2022-09-21 MED ORDER — ESCITALOPRAM OXALATE 20 MG PO TABS: 10.0000 mg | ORAL_TABLET | Freq: Every day | ORAL | Status: DC

## 2022-09-21 MED ORDER — LIDOCAINE HCL (PF) 2 % IJ SOLN
INTRAMUSCULAR | Status: AC
  Filled 2022-09-21: qty 5

## 2022-09-21 MED ORDER — EPHEDRINE SULFATE-NACL 50-0.9 MG/10ML-% IV SOSY
PREFILLED_SYRINGE | INTRAVENOUS | Status: DC | PRN
  Administered 2022-09-21 (×5): 5 mg via INTRAVENOUS

## 2022-09-21 MED ORDER — ALPRAZOLAM 0.25 MG PO TABS: 0.2500 mg | ORAL_TABLET | Freq: Every day | ORAL | Status: DC | PRN

## 2022-09-21 MED ORDER — HYDROMORPHONE HCL 1 MG/ML IJ SOLN: 1.0000 mg | INTRAMUSCULAR | Status: DC | PRN

## 2022-09-21 MED ORDER — FENTANYL CITRATE (PF) 250 MCG/5ML IJ SOLN
INTRAMUSCULAR | Status: AC
  Filled 2022-09-21: qty 5

## 2022-09-21 MED ORDER — PROMETHAZINE HCL 25 MG/ML IJ SOLN: 6.2500 mg | INTRAMUSCULAR | Status: DC | PRN

## 2022-09-21 MED ORDER — ONDANSETRON 4 MG PO TBDP: 4.0000 mg | ORAL_TABLET | Freq: Four times a day (QID) | ORAL | Status: DC | PRN

## 2022-09-21 MED ORDER — CEFAZOLIN SODIUM-DEXTROSE 2-4 GM/100ML-% IV SOLN
2.0000 g | INTRAVENOUS | Status: AC
  Administered 2022-09-21: 2 g via INTRAVENOUS
  Filled 2022-09-21: qty 100

## 2022-09-21 MED ORDER — AMISULPRIDE (ANTIEMETIC) 5 MG/2ML IV SOLN: 10.0000 mg | Freq: Once | INTRAVENOUS | Status: DC | PRN

## 2022-09-21 MED ORDER — ACETAMINOPHEN 325 MG PO TABS
650.0000 mg | ORAL_TABLET | Freq: Four times a day (QID) | ORAL | Status: DC | PRN
  Administered 2022-09-21 – 2022-09-22 (×2): 650 mg via ORAL
  Filled 2022-09-21 (×2): qty 2

## 2022-09-21 MED ORDER — BUPIVACAINE HCL 0.25 % IJ SOLN
INTRAMUSCULAR | Status: AC
  Filled 2022-09-21: qty 1

## 2022-09-21 MED ORDER — ACETAMINOPHEN 160 MG/5ML PO SOLN: 325.0000 mg | ORAL | Status: DC | PRN

## 2022-09-21 MED ORDER — HYDROXYCHLOROQUINE SULFATE 200 MG PO TABS
200.0000 mg | ORAL_TABLET | Freq: Every day | ORAL | Status: DC
  Filled 2022-09-21: qty 1

## 2022-09-21 MED ORDER — ONDANSETRON HCL 4 MG/2ML IJ SOLN
INTRAMUSCULAR | Status: AC
  Filled 2022-09-21: qty 2

## 2022-09-21 MED ORDER — ACETAMINOPHEN 650 MG RE SUPP: 650.0000 mg | Freq: Four times a day (QID) | RECTAL | Status: DC | PRN

## 2022-09-21 MED ORDER — CHLORHEXIDINE GLUCONATE CLOTH 2 % EX PADS: 6.0000 | MEDICATED_PAD | Freq: Once | CUTANEOUS | Status: DC

## 2022-09-21 MED ORDER — ROCURONIUM BROMIDE 10 MG/ML (PF) SYRINGE
PREFILLED_SYRINGE | INTRAVENOUS | Status: AC
  Filled 2022-09-21: qty 10

## 2022-09-21 MED ORDER — METOPROLOL SUCCINATE ER 50 MG PO TB24: 50.0000 mg | ORAL_TABLET | Freq: Every day | ORAL | Status: DC

## 2022-09-21 MED ORDER — TRIAMTERENE-HCTZ 37.5-25 MG PO TABS: 1.0000 | ORAL_TABLET | Freq: Every day | ORAL | Status: DC

## 2022-09-21 MED ORDER — FENTANYL CITRATE PF 50 MCG/ML IJ SOSY
PREFILLED_SYRINGE | INTRAMUSCULAR | Status: AC
  Filled 2022-09-21: qty 2

## 2022-09-21 MED ORDER — LIDOCAINE HCL (CARDIAC) PF 100 MG/5ML IV SOSY
PREFILLED_SYRINGE | INTRAVENOUS | Status: DC | PRN
  Administered 2022-09-21: 60 mg via INTRAVENOUS

## 2022-09-21 MED ORDER — ROCURONIUM BROMIDE 10 MG/ML (PF) SYRINGE
PREFILLED_SYRINGE | INTRAVENOUS | Status: DC | PRN
  Administered 2022-09-21: 50 mg via INTRAVENOUS
  Administered 2022-09-21: 10 mg via INTRAVENOUS

## 2022-09-21 MED ORDER — ACETAMINOPHEN 10 MG/ML IV SOLN
INTRAVENOUS | Status: AC
  Administered 2022-09-21: 1000 mg via INTRAVENOUS
  Filled 2022-09-21: qty 100

## 2022-09-21 MED ORDER — TRAMADOL HCL 50 MG PO TABS: 50.0000 mg | ORAL_TABLET | Freq: Four times a day (QID) | ORAL | Status: DC | PRN

## 2022-09-21 MED ORDER — HEMOSTATIC AGENTS (NO CHARGE) OPTIME
TOPICAL | Status: DC | PRN
  Administered 2022-09-21: 1 via TOPICAL

## 2022-09-21 MED ORDER — LACTATED RINGERS IV SOLN: INTRAVENOUS | Status: DC

## 2022-09-21 MED ORDER — ONDANSETRON HCL 4 MG/2ML IJ SOLN
INTRAMUSCULAR | Status: DC | PRN
  Administered 2022-09-21: 4 mg via INTRAVENOUS

## 2022-09-21 MED ORDER — MIDAZOLAM HCL 2 MG/2ML IJ SOLN
INTRAMUSCULAR | Status: AC
  Filled 2022-09-21: qty 2

## 2022-09-21 MED ORDER — OXYCODONE HCL 5 MG/5ML PO SOLN: 5.0000 mg | Freq: Once | ORAL | Status: DC | PRN

## 2022-09-21 MED ORDER — CHLORHEXIDINE GLUCONATE 0.12 % MT SOLN
15.0000 mL | Freq: Once | OROMUCOSAL | Status: AC
  Administered 2022-09-21: 15 mL via OROMUCOSAL

## 2022-09-21 MED ORDER — ORAL CARE MOUTH RINSE: 15.0000 mL | Freq: Once | OROMUCOSAL | Status: AC

## 2022-09-21 MED ORDER — PROPOFOL 10 MG/ML IV BOLUS
INTRAVENOUS | Status: DC | PRN
  Administered 2022-09-21: 80 mg via INTRAVENOUS

## 2022-09-21 MED ORDER — OXYCODONE HCL 5 MG PO TABS: 5.0000 mg | ORAL_TABLET | Freq: Once | ORAL | Status: DC | PRN

## 2022-09-21 MED ORDER — SODIUM CHLORIDE 0.45 % IV SOLN: INTRAVENOUS | Status: DC

## 2022-09-21 SURGICAL SUPPLY — 32 items
ADH SKN CLS APL DERMABOND .7 (GAUZE/BANDAGES/DRESSINGS) ×2
APL PRP STRL LF DISP 70% ISPRP (MISCELLANEOUS) ×2
ATTRACTOMAT 16X20 MAGNETIC DRP (DRAPES) ×2 IMPLANT
BAG COUNTER SPONGE SURGICOUNT (BAG) ×2 IMPLANT
BAG SPNG CNTER NS LX DISP (BAG) ×1
BLADE SURG 15 STRL LF DISP TIS (BLADE) ×2 IMPLANT
BLADE SURG 15 STRL SS (BLADE) ×2
CHLORAPREP W/TINT 26 (MISCELLANEOUS) ×2 IMPLANT
CLIP TI MEDIUM 6 (CLIP) ×4 IMPLANT
CLIP TI WIDE RED SMALL 6 (CLIP) ×4 IMPLANT
COVER SURGICAL LIGHT HANDLE (MISCELLANEOUS) ×2 IMPLANT
DERMABOND ADVANCED .7 DNX12 (GAUZE/BANDAGES/DRESSINGS) ×2 IMPLANT
DRAPE LAPAROTOMY T 98X78 PEDS (DRAPES) ×2 IMPLANT
DRAPE UTILITY XL STRL (DRAPES) ×2 IMPLANT
ELECT PENCIL ROCKER SW 15FT (MISCELLANEOUS) ×2 IMPLANT
ELECT REM PT RETURN 15FT ADLT (MISCELLANEOUS) ×2 IMPLANT
GAUZE 4X4 16PLY ~~LOC~~+RFID DBL (SPONGE) ×2 IMPLANT
GLOVE SURG ORTHO 8.0 STRL STRW (GLOVE) ×2 IMPLANT
GOWN STRL REUS W/ TWL XL LVL3 (GOWN DISPOSABLE) ×4 IMPLANT
GOWN STRL REUS W/TWL XL LVL3 (GOWN DISPOSABLE) ×4
HEMOSTAT SURGICEL 2X4 FIBR (HEMOSTASIS) ×2 IMPLANT
ILLUMINATOR WAVEGUIDE N/F (MISCELLANEOUS) ×2 IMPLANT
KIT BASIN OR (CUSTOM PROCEDURE TRAY) ×2 IMPLANT
KIT TURNOVER KIT A (KITS) IMPLANT
PACK BASIC VI WITH GOWN DISP (CUSTOM PROCEDURE TRAY) ×2 IMPLANT
SHEARS HARMONIC 9CM CVD (BLADE) ×2 IMPLANT
SUT MNCRL AB 4-0 PS2 18 (SUTURE) ×2 IMPLANT
SUT VIC AB 3-0 SH 18 (SUTURE) ×4 IMPLANT
SYR BULB IRRIG 60ML STRL (SYRINGE) ×2 IMPLANT
TOWEL OR 17X26 10 PK STRL BLUE (TOWEL DISPOSABLE) ×2 IMPLANT
TOWEL OR NON WOVEN STRL DISP B (DISPOSABLE) ×2 IMPLANT
TUBING CONNECTING 10 (TUBING) ×2 IMPLANT

## 2022-09-21 NOTE — OR Nursing (Signed)
Original final closing (1057) and end time (1111) prior to reopening the incision for exploration. Tiffany Kane

## 2022-09-21 NOTE — Interval H&P Note (Signed)
History and Physical Interval Note:  09/21/2022 9:30 AM  Tiffany Kane  has presented today for surgery, with the diagnosis of MULTIPLE THYROID NODULES, THYROID NEOPLASM OF UNCERTAIN BEHAVIOR.  The various methods of treatment have been discussed with the patient and family. After consideration of risks, benefits and other options for treatment, the patient has consented to    Procedure(s): RIGHT THYROID LOBECTOMY (Right) as a surgical intervention.    The patient's history has been reviewed, patient examined, no change in status, stable for surgery.  I have reviewed the patient's chart and labs.  Questions were answered to the patient's satisfaction.    Darnell Level, MD [facility] practice Office: [phone]   Darnell Level

## 2022-09-21 NOTE — Anesthesia Procedure Notes (Signed)
Procedure Name: Intubation Date/Time: 09/21/2022 9:52 AM  Performed by: Caren Macadam, CRNAPre-anesthesia Checklist: Patient identified, Emergency Drugs available, Suction available and Patient being monitored Patient Re-evaluated:Patient Re-evaluated prior to induction Oxygen Delivery Method: Circle system utilized Preoxygenation: Pre-oxygenation with 100% oxygen Induction Type: IV induction Ventilation: Mask ventilation without difficulty Laryngoscope Size: Miller and 2 Grade View: Grade I Tube type: Oral Tube size: 7.0 mm Number of attempts: 1 Airway Equipment and Method: Stylet Placement Confirmation: ETT inserted through vocal cords under direct vision, positive ETCO2 and breath sounds checked- equal and bilateral Secured at: 22 cm Tube secured with: Tape Dental Injury: Teeth and Oropharynx as per pre-operative assessment

## 2022-09-21 NOTE — OR Nursing (Signed)
Swelling noted upon undraping patient.  MD notified and decision made to reopen incision for exploration.

## 2022-09-21 NOTE — Anesthesia Postprocedure Evaluation (Signed)
Anesthesia Post Note  Patient: Tiffany Kane  Procedure(s) Performed: RIGHT THYROID LOBECTOMY (Right: Neck)     Patient location during evaluation: PACU Anesthesia Type: General Level of consciousness: awake and alert Pain management: pain level controlled Vital Signs Assessment: post-procedure vital signs reviewed and stable Respiratory status: spontaneous breathing, nonlabored ventilation, respiratory function stable and patient connected to nasal cannula oxygen Cardiovascular status: blood pressure returned to baseline and stable Postop Assessment: no apparent nausea or vomiting Anesthetic complications: no  No notable events documented.  Last Vitals:  Vitals:   09/21/22 1245 09/21/22 1300  BP: (!) 116/59 (!) 112/54  Pulse: 68 63  Resp: 17 12  Temp:    SpO2: 100% 98%    Last Pain:  Vitals:   09/21/22 1300  TempSrc:   PainSc: 3                  Shelton Silvas

## 2022-09-21 NOTE — Transfer of Care (Signed)
Immediate Anesthesia Transfer of Care Note  Patient: Tiffany Kane  Procedure(s) Performed: RIGHT THYROID LOBECTOMY (Right: Neck)  Patient Location: PACU  Anesthesia Type:General  Level of Consciousness: awake  Airway & Oxygen Therapy: Patient Spontanous Breathing and Patient connected to face mask oxygen  Post-op Assessment: Report given to RN and Post -op Vital signs reviewed and stable  Post vital signs: Reviewed and stable  Last Vitals:  Vitals Value Taken Time  BP 158/108 09/21/22 1203  Temp    Pulse 87 09/21/22 1205  Resp 25 09/21/22 1205  SpO2 100 % 09/21/22 1205  Vitals shown include unvalidated device data.  Last Pain:  Vitals:   09/21/22 0755  TempSrc:   PainSc: 0-No pain         Complications: No notable events documented.

## 2022-09-21 NOTE — Discharge Instructions (Signed)
[facility] [place] [facility] - Dr. Jamus Loving  THYROID & PARATHYROID SURGERY:  POST-OP INSTRUCTIONS  Always review the instruction sheet provided by the hospital nurse at discharge.  A prescription for pain medication may be sent to your pharmacy at the time of discharge.  Take your pain medication as prescribed.  If narcotic pain medicine is not needed, then you may take acetaminophen (Tylenol) or ibuprofen (Advil) as needed for pain or soreness.  Take your normal home medications as prescribed unless otherwise directed.  If you need a refill on your pain medication, please contact the office during regular business hours.  Prescriptions will not be processed by the office after 5:00PM or on weekends.  Start with a light diet upon arrival home, such as soup and crackers or toast.  Be sure to drink plenty of fluids.  Resume your normal diet the day after surgery.  Most patients will experience some swelling and bruising on the chest and neck area.  Ice packs will help for the first 48 hours after arriving home.  Swelling and bruising will take several days to resolve.   It is common to experience some constipation after surgery.  Increasing fluid intake and taking a stool softener (Colace) will usually help to prevent this problem.  A mild laxative (Milk of Magnesia or Miralax) should be taken according to package directions if there has been no bowel movement after 48 hours.  Dermabond glue covers your incision. This seals the wound and you may shower at any time. The Dermabond will remain in place for about a week.  You may gradually remove the glue when it loosens around the edges.  If you need to loosen the Dermabond for removal, apply a layer of Vaseline to the wound for 15 minutes and then remove with a Kleenex. Your sutures are under the skin and will not show - they will dissolve on their own.  You may resume light daily activities beginning the day after discharge (such as self-care,  walking, climbing stairs), gradually increasing activities as tolerated. You may have sexual intercourse when it is comfortable. Refrain from any heavy lifting or straining until approved by your doctor. You may drive when you no longer are taking prescription pain medication, you can comfortably wear a seatbelt, and you can safely maneuver your car and apply the brakes.  You will see your doctor in the office for a follow-up appointment approximately three weeks after your surgery.  Make sure that you call for this appointment within a day or two after you arrive home to insure a convenient appointment time. Please have any requested laboratory tests performed a few days prior to your office visit so that the results will be available at your follow up appointment.  WHEN TO CALL THE [facility] OFFICE: -- Fever greater than 101.5 -- Inability to urinate -- Nausea and/or vomiting - persistent -- Extreme swelling or bruising -- Continued bleeding from incision -- Increased pain, redness, or drainage from the incision -- Difficulty swallowing or breathing -- Muscle cramping or spasms -- Numbness or tingling in hands or around lips  The clinic staff is available to answer your questions during regular business hours.  Please don't hesitate to call and ask to speak to one of the nurses if you have concerns.  [facility] OFFICE: [phone] (24 hours)  Please sign up for MyChart accounts. This will allow you to communicate directly with my nurse or myself without having to call the office. It will also allow you   to view your test results. You will need to enroll in MyChart for my office ([facility]) and for the hospital ([place]).  Gale Hulse, MD [facility] practice 

## 2022-09-21 NOTE — Op Note (Signed)
Procedure Note  Pre-operative Diagnosis:  thyroid neoplasm of uncertain behavior, multiple thyroid nodules  Post-operative Diagnosis:  same  Surgeon:  Darnell Level, MD  Assistant:  none   Procedure:  Right thyroid lobectomy and isthmusectomy  Anesthesia:  General  Estimated Blood Loss:  50 cc  Drains: none         Specimen: thyroid lobe to pathology  Indications:  Patient is referred by her primary care provider, Jorge Ny, NP, at [facility] physicians for surgical evaluation and management of multiple thyroid nodules with a right sided thyroid neoplasm of uncertain behavior. Patient had undergone evaluation for dysphagia. Incidental finding was made of thyroid nodules. Patient underwent a thyroid ultrasound on June 20, 2022. This showed a mildly enlarged right thyroid lobe with 3 nodules. 2 nodules underwent fine-needle aspiration biopsy. The right mid 2.7 cm nodule returned with a result of atypia. Specimen was submitted for molecular genetic testing with [facility]. This returned with a result of suspicious, rendering a risk of malignancy of 50%. Patient is now referred for consideration of resection for definitive diagnosis and management.   Procedure Details: Procedure was done in OR #1 at the [facility] The patient was brought to the operating room and placed in a supine position on the operating room table. Following administration of general anesthesia, the patient was positioned and then prepped and draped in the usual aseptic fashion. After ascertaining that an adequate level of anesthesia had been achieved, a small Kocher incision was made with #15 blade. Dissection was carried through subcutaneous tissues and platysma. Hemostasis was achieved with the electrocautery. Skin flaps were elevated cephalad and caudad from the thyroid notch to the sternal notch. A self-retaining retractor was placed for exposure. Strap muscles were incised in the midline and dissection was begun on  the right side. Strap muscles were reflected laterally. The right thyroid lobe was mildly enlarged with multiple nodules. The lobe was gently mobilized with blunt dissection. Superior pole vessels were dissected out and divided individually between small and medium ligaclips with the harmonic scalpel. The thyroid lobe was rolled anteriorly. Branches of the inferior thyroid artery were divided between small ligaclips with the harmonic scalpel. Inferior venous tributaries were divided between ligaclips. Both the superior and inferior parathyroid glands were identified and preserved on their vascular pedicles. The recurrent laryngeal nerve was identified and preserved along its course. The ligament of Allyson Sabal was released with the electrocautery and the gland was mobilized onto the anterior trachea. Isthmus was mobilized across the midline. There was no significant pyramidal lobe present. The thyroid parenchyma was transected at the junction of the isthmus and contralateral thyroid lobe with the harmonic scalpel. A suture was used to mark the isthmus margin. The thyroid lobe and isthmus were submitted to pathology for review.  The entire field was palpated for evidence of lymphadenopathy or extra-thyroidal disease.  No worrisome findings were noted.  No enlarged lymph nodes were identified.  The neck was irrigated with warm saline. Fibrillar was placed throughout the operative field. Strap muscles were approximated in the midline with interrupted 3-0 Vicryl sutures. Platysma was closed with interrupted 3-0 Vicryl sutures. Skin was closed with a running 4-0 Monocryl subcuticular suture.  Wound was washed and dried and Dermabond was applied.   As the patient was awakening from anesthesia, she experienced some coughing and straining.  An area of soft tissue swelling was noted both inferior and superior to the surgical incision and extending into the right neck.  After evaluation, a  decision was made to reopen the  incision.  An adequate level of anesthesia was reestablished.  Dermabond was removed from the incision.  Incision was opened with a #15 blade.  There was a moderate amount of fresh clot in the subcutaneous plane measuring approximately 25 cc.  This was removed.  There appeared to be an active bleeding vessel at the right lateralmost extent of the incision just beneath the platysma.  This was cauterized.  Space was then irrigated with warm saline and all blood and thrombus was evacuated.  Deep space was inspected and there was no evidence of bleeding below the strap muscles.  Suture ligatures were placed.  Subcutaneous space was irrigated with warm saline and there was no further evidence of bleeding.  Fibrillar was placed throughout the subcutaneous space.  Platysma was then closed with interrupted 3-0 Vicryl sutures.  Skin was closed with a running 4-0 Monocryl subcuticular suture.  Wound was washed and dried and Dermabond was again placed as dressing.  The patient was awakened from anesthesia and brought to the recovery room. The patient tolerated the procedure well.   Darnell Level, MD [facility] Office: [phone]

## 2022-09-22 ENCOUNTER — Encounter (HOSPITAL_COMMUNITY): Payer: Self-pay | Admitting: Surgery

## 2022-09-22 DIAGNOSIS — F32A Depression, unspecified: Secondary | ICD-10-CM | POA: Diagnosis not present

## 2022-09-22 DIAGNOSIS — E042 Nontoxic multinodular goiter: Secondary | ICD-10-CM | POA: Diagnosis not present

## 2022-09-22 DIAGNOSIS — Z87891 Personal history of nicotine dependence: Secondary | ICD-10-CM | POA: Diagnosis not present

## 2022-09-22 DIAGNOSIS — I10 Essential (primary) hypertension: Secondary | ICD-10-CM | POA: Diagnosis not present

## 2022-09-22 DIAGNOSIS — I2510 Atherosclerotic heart disease of native coronary artery without angina pectoris: Secondary | ICD-10-CM | POA: Diagnosis not present

## 2022-09-22 MED ORDER — TRAMADOL HCL 50 MG PO TABS: 50.0000 mg | ORAL_TABLET | Freq: Four times a day (QID) | ORAL | 0 refills | Status: AC | PRN

## 2022-09-22 MED ORDER — ACETAMINOPHEN 500 MG PO TABS: 1000.0000 mg | ORAL_TABLET | Freq: Three times a day (TID) | ORAL | 0 refills | Status: AC

## 2022-09-22 NOTE — Progress Notes (Signed)
Discharge instructions discussed with patient and family, verbalized agreement and understanding 

## 2022-09-22 NOTE — Progress Notes (Signed)
  Transition of Care [facility]) Screening Note   Patient Details  Name: CIARAN IMDIEKE Date of Birth: [birth year]   Transition of Care [facility]) CM/SW Contact:    Adrian Prows, RN Phone Number: 09/22/2022, 9:52 AM    Transition of Care Department [facility]) has reviewed patient and no TOC needs have been identified at this time. We will continue to monitor patient advancement through interdisciplinary progression rounds. If new patient transition needs arise, please place a TOC consult.

## 2022-09-22 NOTE — Discharge Summary (Signed)
Physician Discharge Summary  Tiffany Kane:[id number] DOB: Dec 26, 1940 DOA: 09/21/2022  PCP: Camie Patience, FNP  Admit date: 09/21/2022 Discharge date: 09/22/2022  Recommendations for Outpatient Follow-up:     Follow-up Information     Darnell Level, MD. Schedule an appointment as soon as possible for a visit in 3 week(s).   Specialty: General Surgery Why: For wound re-check Contact information: [address] [place] [address] [phone]                Discharge Diagnoses:  thyroid neoplasm of uncertain behavior, multiple thyroid nodules  HTN HPL  Surgical Procedure: Right thyroid lobectomy and isthmusectom 09/21/22 Dr Gerrit Friends  Discharge Condition: good Disposition: home  Diet recommendation: cardiac  Filed Weights   09/21/22 0755  Weight: 72.1 kg    History of present illness: Patient is referred by her primary care provider, Jorge Ny, NP, at [facility] physicians for surgical evaluation and management of multiple thyroid nodules with a right sided thyroid neoplasm of uncertain behavior. Patient had undergone evaluation for dysphagia. Incidental finding was made of thyroid nodules. Patient underwent a thyroid ultrasound on June 20, 2022. This showed a mildly enlarged right thyroid lobe with 3 nodules. 2 nodules underwent fine-needle aspiration biopsy. The right mid 2.7 cm nodule returned with a result of atypia. Specimen was submitted for molecular genetic testing with [facility]. This returned with a result of suspicious, rendering a risk of malignancy of 50%. Patient is now referred for consideration of resection for definitive diagnosis and management.    Hospital Course:  [facility] was brought in for planned procedure.  Please see operative note.  Prior to extubation she experienced some coughing and straining as a result an area of soft tissue swelling was noted around the incision and Dr. Gerrit Friends reopened the incision and explored the neck.   Please see his operative note for additional details.  She was kept overnight for observation.  On postoperative day 1 she was doing well.  She had a little bit of bruising around her incision especially inferior but no hematoma.  Had a small amount of swelling underneath the incision which was expected.  She had no nausea or vomiting.  She was tolerating a diet.  She had ambulated.  Her vital signs were stable.  There was no evidence of postoperative hematoma.  Voice was normal.  Pain was controlled.  We discussed discharge instructions  BP (!) 123/59 (BP Location: Left Arm)   Pulse 64   Temp 98 F (36.7 C) (Oral)   Resp 18   Ht 5' (1.524 m)   Wt 72.1 kg   SpO2 97%   BMI 31.05 kg/m   Gen: alert, NAD, non-toxic appearing Pupils: equal, no scleral icterus Pulm: symmetric chest rise CV: regular rate and rhythm Neck: incision c/d/I; some bruising around/inf to incision, no hematoma. Small amount of typical swelling under incision Skin: no rash, no jaundice    Discharge Instructions  Discharge Instructions     Call MD for:   Complete by: As directed    Temperature >101   Call MD for:  hives   Complete by: As directed    Call MD for:  persistant dizziness or light-headedness   Complete by: As directed    Call MD for:  persistant nausea and vomiting   Complete by: As directed    Call MD for:  redness, tenderness, or signs of infection (pain, swelling, redness, odor or green/yellow discharge around incision site)  Complete by: As directed    Call MD for:  severe uncontrolled pain   Complete by: As directed    Diet - low sodium heart healthy   Complete by: As directed    Discharge instructions   Complete by: As directed    See [facility] discharge instructions   Increase activity slowly   Complete by: As directed       Allergies as of 09/22/2022       Reactions   Demerol [meperidine] Itching   Years ago, causes jittery, feels like going to pass out    Alendronate    GI upset    Meloxicam    ulcer   Zoledronic Acid    Flu like sx   Prednisone    JITTERY FEELING, UNABLE TO FOCUS   Zoloft [sertraline Hcl]    CAUSES PANIC ATTACKS        Medication List     TAKE these medications    acetaminophen 500 MG tablet Commonly known as: TYLENOL Take 2 tablets (1,000 mg total) by mouth every 8 (eight) hours for 5 days.   ALPRAZolam 0.25 MG tablet Commonly known as: XANAX Take 0.25 mg by mouth daily as needed for anxiety.   aspirin EC 81 MG tablet Take 1 tablet (81 mg total) by mouth daily. Swallow whole.   escitalopram 10 MG tablet Commonly known as: LEXAPRO Take 10 mg by mouth daily.   folic acid 1 MG tablet Commonly known as: FOLVITE Take 1 mg by mouth daily.   hydroxychloroquine 200 MG tablet Commonly known as: PLAQUENIL Take 200 mg by mouth daily.   metoprolol succinate 50 MG 24 hr tablet Commonly known as: TOPROL-XL TAKE 1 TABLET BY MOUTH TWICE DAILY WITH OR IMMEDIATELY FOLLOWING A MEAL   rosuvastatin 20 MG tablet Commonly known as: CRESTOR Take 1 tablet (20 mg total) by mouth daily.   traMADol 50 MG tablet Commonly known as: Ultram Take 1 tablet (50 mg total) by mouth every 6 (six) hours as needed for up to 7 days.   triamterene-hydrochlorothiazide 37.5-25 MG tablet Commonly known as: MAXZIDE-25 Take 1 tablet by mouth daily.   zinc gluconate 50 MG tablet Take 50 mg by mouth daily.        Follow-up Information     Darnell Level, MD. Schedule an appointment as soon as possible for a visit in 3 week(s).   Specialty: General Surgery Why: For wound re-check Contact information: [address] [place] [address] [phone]                  The results of significant diagnostics from this hospitalization (including imaging, microbiology, ancillary and laboratory) are listed below for reference.    Significant Diagnostic Studies: No results found.  Microbiology: No results found for this or any  previous visit (from the past 240 hour(s)).   Labs: none  Principal Problem:   Neoplasm of uncertain behavior of thyroid gland Active Problems:   Multiple thyroid nodules   Time coordinating discharge: 20 min  Signed:  Atilano Ina, MD [facility] Surgery, A [facility] [phone] 09/22/2022, 9:26 AM

## 2022-09-26 LAB — SURGICAL PATHOLOGY

## 2022-09-27 NOTE — Progress Notes (Signed)
Great news!  Final pathology is benign - no cancer.  Will have printed copy of path report at post op visit.  tmg  Darnell Level, MD [facility] practice Office: [phone]

## 2022-09-29 ENCOUNTER — Other Ambulatory Visit: Payer: Self-pay | Admitting: Interventional Cardiology

## 2022-10-01 ENCOUNTER — Other Ambulatory Visit: Payer: Self-pay | Admitting: Interventional Cardiology

## 2022-10-23 DIAGNOSIS — I2510 Atherosclerotic heart disease of native coronary artery without angina pectoris: Secondary | ICD-10-CM | POA: Diagnosis not present

## 2022-10-23 DIAGNOSIS — I712 Thoracic aortic aneurysm, without rupture, unspecified: Secondary | ICD-10-CM | POA: Diagnosis not present

## 2022-10-23 DIAGNOSIS — J849 Interstitial pulmonary disease, unspecified: Secondary | ICD-10-CM | POA: Diagnosis not present

## 2022-10-23 DIAGNOSIS — M069 Rheumatoid arthritis, unspecified: Secondary | ICD-10-CM | POA: Diagnosis not present

## 2022-10-23 DIAGNOSIS — G9520 Unspecified cord compression: Secondary | ICD-10-CM | POA: Diagnosis not present

## 2022-11-05 DIAGNOSIS — D440 Neoplasm of uncertain behavior of thyroid gland: Secondary | ICD-10-CM | POA: Diagnosis not present

## 2022-11-05 DIAGNOSIS — Z9009 Acquired absence of other part of head and neck: Secondary | ICD-10-CM | POA: Diagnosis not present

## 2022-11-21 ENCOUNTER — Encounter: Payer: Self-pay | Admitting: Emergency Medicine

## 2022-11-21 ENCOUNTER — Ambulatory Visit: Payer: Medicare Other | Admitting: Emergency Medicine

## 2022-11-21 VITALS — BP 124/72 | HR 55 | Temp 98.7°F | Ht 60.0 in | Wt 161.4 lb

## 2022-11-21 DIAGNOSIS — J849 Interstitial pulmonary disease, unspecified: Secondary | ICD-10-CM | POA: Diagnosis not present

## 2022-11-21 NOTE — Assessment & Plan Note (Signed)
Stable changes of ILD consistent with her known RA.  No evidence of flaring, no evidence of COP at this time.  She is clinically stable as well.  Managed only on hydroxychloroquine.  Did review with her the fact that she is at risk for opportunistic infection and that if she has a clinical change we would repeat imaging.  In absence of this I think we can plan to repeat a high-resolution CT chest in April 2025.  If stable at that time then we can space out her interval follow-up.

## 2022-11-21 NOTE — Progress Notes (Signed)
Subjective:    Patient ID: Tiffany Kane, female    DOB: [birth year], 82 y.o.   MRN: [id number]  HPI  ROV 08/03/21 --follow-up visit for 82 year old woman with a history of RA previously on methotrexate, breast cancer, hypertension.  She had COP in 2016, 2014.  She has had flaring symptoms of dyspnea that correlate with bilateral groundglass opacities especially on the right consistent with recurrence of her inflammatory lung disease.  Her methotrexate has been stopped and she has improved with a slow taper of prednisone.  Her current dose is 10 mg daily.  Repeat CT as below  High-resolution CT scan of the chest done 07/18/2021 reviewed by me shows significant regression of her groundglass infiltrates compared with January 2023.  There are still a few patchy areas of groundglass attenuation.  There is septal thickening and some subpleural reticulation, mild cylindrical bronchiectatic change without overt honeycombing.  Overall similar to 04/03/2021.   ROV 03/07/22 --82 year old woman with rheumatoid arthritis (seronegative), breast cancer, hypertension, COP (2014, 2016).  She had a more recent episode of pneumonitis, question related to methotrexate which was ultimately stopped. Changed to hydroxychlorquine.  She was treated with a slow prednisone taper and infiltrates, clinical status improved..  She returns today reporting that she is doing well, that her pred has been tapered to off. No joint pain or swelling. No resp sx.  Flu and covid shots up to date.   ROV 11/21/2022 --Tiffany Kane follows up today for her history of rheumatoid arthritis, abnormal CT scan of the chest.  She is 81 with RA, history of breast cancer, hypertension, cryptogenic organizing pneumonia.  She was treated in 2023 for pneumonitis, question related to methotrexate which was then changed to hydroxychloroquine.  Required a slow prednisone taper, now off.  Repeat CT chest as below. She feels well. Underwent partial thyroidectomy  since I last saw her.   CT scan of the chest high-resolution/8/24 reviewed by me showed no mediastinal or hilar adenopathy, no consolidation, stable small scattered pulmonary nodules, moderate patchy air trapping unchanged with some peribronchovascular and subpleural reticulation and groundglass.  No frank honeycomb change.  No progression of any kind.  With March 2023.   Review of Systems As per HPI      Objective:   Physical Exam Vitals:   11/21/22 0834  BP: 124/72  Pulse: (!) 55  Temp: 98.7 F (37.1 C)  TempSrc: Oral  SpO2: 96%  Weight: 161 lb 6.4 oz (73.2 kg)  Height: 5' (1.524 m)   Gen: Pleasant, overweight elderly woman, well-nourished, in no distress,  normal affect  ENT: No lesions,  mouth clear,  oropharynx clear, no postnasal drip  Neck: No JVD, no stridor  Lungs: No use of accessory muscles, no crackles or wheezing on normal respiration, no wheeze on forced expiration  Cardiovascular: RRR, heart sounds normal, no murmur or gallops, no peripheral edema  Musculoskeletal: No deformities, no cyanosis or clubbing  Neuro: alert, awake, non focal  Skin: Warm, no lesions or rash      Assessment & Plan:   ILD (interstitial lung disease) (HCC) Stable changes of ILD consistent with her known RA.  No evidence of flaring, no evidence of COP at this time.  She is clinically stable as well.  Managed only on hydroxychloroquine.  Did review with her the fact that she is at risk for opportunistic infection and that if she has a clinical change we would repeat imaging.  In absence of this I think we can plan  to repeat a high-resolution CT chest in April 2025.  If stable at that time then we can space out her interval follow-up.   Levy Pupa, MD, PhD 11/21/2022, 9:07 AM  Pulmonary and Critical Care [phone] or if no answer before 7:00PM call [phone] For any issues after 7:00PM please call eLink [phone]

## 2022-11-21 NOTE — Addendum Note (Signed)
Addended by: Dorisann Frames R on: 11/21/2022 09:28 AM   Modules accepted: Orders

## 2022-11-21 NOTE — Patient Instructions (Signed)
We reviewed your CT scan of the chest today. Please continue your hydroxychloroquine as you have been taking it We will plan to repeat a high-resolution CT chest in April 2025 to compare with your priors. Please follow Dr. Delton Coombes in April 2025 after your CT so we can review those results.  Please call sooner if you develop any new symptoms of cough, congestion, fever, chills.

## 2022-12-14 ENCOUNTER — Other Ambulatory Visit: Payer: Self-pay | Admitting: Interventional Cardiology

## 2022-12-14 DIAGNOSIS — E042 Nontoxic multinodular goiter: Secondary | ICD-10-CM | POA: Diagnosis not present

## 2022-12-14 DIAGNOSIS — D440 Neoplasm of uncertain behavior of thyroid gland: Secondary | ICD-10-CM | POA: Diagnosis not present

## 2022-12-19 DIAGNOSIS — Z1231 Encounter for screening mammogram for malignant neoplasm of breast: Secondary | ICD-10-CM | POA: Diagnosis not present

## 2023-01-04 ENCOUNTER — Other Ambulatory Visit: Payer: Self-pay | Admitting: Interventional Cardiology

## 2023-01-21 DIAGNOSIS — I712 Thoracic aortic aneurysm, without rupture, unspecified: Secondary | ICD-10-CM | POA: Diagnosis not present

## 2023-01-21 DIAGNOSIS — I10 Essential (primary) hypertension: Secondary | ICD-10-CM | POA: Diagnosis not present

## 2023-01-21 DIAGNOSIS — E782 Mixed hyperlipidemia: Secondary | ICD-10-CM | POA: Diagnosis not present

## 2023-01-21 DIAGNOSIS — J849 Interstitial pulmonary disease, unspecified: Secondary | ICD-10-CM | POA: Diagnosis not present

## 2023-01-21 DIAGNOSIS — Z23 Encounter for immunization: Secondary | ICD-10-CM | POA: Diagnosis not present

## 2023-01-21 DIAGNOSIS — M069 Rheumatoid arthritis, unspecified: Secondary | ICD-10-CM | POA: Diagnosis not present

## 2023-01-21 DIAGNOSIS — E559 Vitamin D deficiency, unspecified: Secondary | ICD-10-CM | POA: Diagnosis not present

## 2023-01-21 DIAGNOSIS — E890 Postprocedural hypothyroidism: Secondary | ICD-10-CM | POA: Diagnosis not present

## 2023-01-21 DIAGNOSIS — R7301 Impaired fasting glucose: Secondary | ICD-10-CM | POA: Diagnosis not present

## 2023-01-21 DIAGNOSIS — Z0000 Encounter for general adult medical examination without abnormal findings: Secondary | ICD-10-CM | POA: Diagnosis not present

## 2023-01-24 DIAGNOSIS — Z79899 Other long term (current) drug therapy: Secondary | ICD-10-CM | POA: Diagnosis not present

## 2023-01-24 DIAGNOSIS — M810 Age-related osteoporosis without current pathological fracture: Secondary | ICD-10-CM | POA: Diagnosis not present

## 2023-01-24 DIAGNOSIS — M2550 Pain in unspecified joint: Secondary | ICD-10-CM | POA: Diagnosis not present

## 2023-01-24 DIAGNOSIS — M06041 Rheumatoid arthritis without rheumatoid factor, right hand: Secondary | ICD-10-CM | POA: Diagnosis not present

## 2023-02-27 NOTE — Progress Notes (Deleted)
Cardiology Office Note:  .   Date:  02/27/2023  ID:  [name], DOB 01/25/1941, MRN [id number] PCP: Camie Patience, FNP  [facility] Providers Cardiologist:  Lance Muss, MD { Click to update primary MD,subspecialty MD or APP then REFRESH:1}   History of Present Illness: .   Tiffany Kane is a 82 y.o. 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  ROS: ***  Studies Reviewed: Marland Kitchen         Prior CV Studies: {Select studies to display:26339}   Jan 2023 .    Echo showed: "Left ventricular ejection fraction, by estimation, is 55 to 60%. The  left ventricle has normal function. The left ventricle has no regional  wall motion abnormalities. Left ventricular diastolic parameters are  indeterminate.   2. Right ventricular systolic function is normal. The right ventricular  size is normal.   3. The mitral valve is normal in structure. Mild mitral valve  regurgitation.   4. Tricuspid valve regurgitation is mild to moderate.   5. The aortic valve is tricuspid. Aortic valve regurgitation is trivial.  Aortic valve sclerosis is present, with no evidence of aortic valve  stenosis.   6. The inferior vena cava dilated size. "    Risk Assessment/Calculations:   {Does this patient have ATRIAL FIBRILLATION?[phone]} No BP recorded.  {Refresh Note OR Click here to enter BP  :1}***       Physical Exam:   VS:  There were no vitals taken for this visit.   Wt Readings from Last 3 Encounters:  11/21/22 161 lb 6.4 oz (73.2 kg)  09/21/22 159 lb (72.1 kg)  09/13/22 159 lb (72.1 kg)    GEN: Well nourished, well developed in no acute distress NECK: No JVD; No carotid bruits CARDIAC: ***RRR, no murmurs, rubs, gallops RESPIRATORY:  Clear to auscultation without rales, wheezing or rhonchi  ABDOMEN: Soft, non-tender, non-distended EXTREMITIES:  No edema; No deformity    ASSESSMENT AND PLAN: .   Thoracic aneurysm: The PE CT scan in January 2023 showed the size was 4.2 cm, unchanged from 2019.  Her blood pressure has been well controlled.  The lack of change in size in the aorta is reassuring.  Will not plan for any dedicated imaging of the aorta.  She has chest CTs for her lungs frequently enough that that may be sufficient.  In addition given the lack of change and her age, screening may not be that high yield.  Hypertension:The current medical regimen is effective;  continue present plan and medications.  Hyperlipidemia: HDL 66, LDL 109, total cholesterol 200, triglycerides 143 in July 2022.  Whole food, plant-based diet.  Avoid processed food.  High-fiber diet.  Stop simvastatin, start rosuvastatin 20 mg daily.  Will check lipids and liver tests in about 2 to 3 months.  Less side effects with rosuvastatin compared to simvastatin.  Also, better prevention and lipid-lowering with the rosuvastatin.  CAD: Mild, nonobstructive disease by cath in 2001.  Negative stress test in 2010. No complaints regarding strength in her arms as there was a question of subclavian stenosis in the past. No syncope.  Stay well hydrated.        {Are you ordering a CV Procedure (e.g. stress test, cath, DCCV, TEE, etc)?   Press F2        :[id number]}  Dispo: ***  Signed, Jacolyn Reedy, PA-C

## 2023-03-12 ENCOUNTER — Ambulatory Visit: Payer: Medicare Other | Admitting: Physician Assistant

## 2023-04-15 ENCOUNTER — Other Ambulatory Visit: Payer: Self-pay | Admitting: Interventional Cardiology

## 2023-04-15 ENCOUNTER — Other Ambulatory Visit: Payer: Self-pay | Admitting: Nurse Practitioner

## 2023-04-15 NOTE — Progress Notes (Unsigned)
Cardiology Office Note    Patient Name: Tiffany Kane Date of Encounter: 04/15/2023  Primary Care Provider:  Camie Patience, FNP Primary Cardiologist:  Lance Muss, MD Primary Electrophysiologist: None   Past Medical History    Past Medical History:  Diagnosis Date   Anxiety    Arthritis    Breast cancer (HCC) 05/14/2000   DUCTAL IN SITU.Marland Kitchen1999   Bronchitis    Compression fracture    T-11   Coronary artery disease    Depression    Depression    situational stress   Esophageal reflux    H/O ascites 09/11/2004   High cholesterol    Hyperlipemia    Hyperlipidemia    Hypertension    Osteopenia    Osteoporosis 03/14/2006   Pneumonia    Thoracic aneurysm    DR. Amil Amen   Thoracic aortic aneurysm (HCC)    currently 4.4 cm   Thyromegaly     History of Present Illness  Tiffany Kane is a 82 y.o. female with a PMH of nonobstructive CAD by LHC in 2001, HTN, HLD, thoracic aneurysm, breast CA, interstitial lung disease, rheumatoid arthritis, carotid calcifications, thyroid CA s/p right thyroid lobectomy 09/2022 who presents today for annual follow-up.  Ms. Liley has been followed by Dr. Eldridge Dace since 2014 and was previously followed by Dr. Ty Hilts.  She underwent LHC in 2001 that showed nonobstructive coronary disease and completed a normal perfusion study in 2010.  She was diagnosed with thoracic aneurysm in 2011 with last CTA of the chest completed in 02/2022 showing stable ascending thoracic aorta of 4.2 cm.  She was last seen by Dr. Eldridge Dace on 07/2021 and patient reported no complaints of chest pain with controlled cholesterol and BP.  Dr. Eldridge Dace recommended no further studies at that time with recommendation to continue BP monitoring.  She underwent carotid ultrasound that showed multiple thyroid lesions with biopsy showing risk of 50% malignancy. She underwent successful right thyroid lobectomy on 09/21/2022.  She had pathology completed that showed that her  nodules were benign.  During today's visit the patient reports*** .  Patient denies chest pain, palpitations, dyspnea, PND, orthopnea, nausea, vomiting, dizziness, syncope, edema, weight gain, or early satiety.  ***Notes: Patient will not need annual MR angio of the chest per Dr. Eldridge Dace  Review of Systems  Please see the history of present illness.    All other systems reviewed and are otherwise negative except as noted above.  Physical Exam    Wt Readings from Last 3 Encounters:  11/21/22 161 lb 6.4 oz (73.2 kg)  09/21/22 159 lb (72.1 kg)  09/13/22 159 lb (72.1 kg)   UJ:WJXBJ were no vitals filed for this visit.,There is no height or weight on file to calculate BMI. GEN: Well nourished, well developed in no acute distress Neck: No JVD; No carotid bruits Pulmonary: Clear to auscultation without rales, wheezing or rhonchi  Cardiovascular: Normal rate. Regular rhythm. Normal S1. Normal S2.   Murmurs: There is no murmur.  ABDOMEN: Soft, non-tender, non-distended EXTREMITIES:  No edema; No deformity   EKG/LABS/ Recent Cardiac Studies   ECG personally reviewed by me today - ***  Risk Assessment/Calculations:   {Does this patient have ATRIAL FIBRILLATION?:[phone]}      Lab Results  Component Value Date   WBC 6.6 09/13/2022   HGB 14.0 09/13/2022   HCT 41.8 09/13/2022   MCV 90.5 09/13/2022   PLT 204 09/13/2022   Lab Results  Component Value Date   CREATININE 0.83  09/13/2022   BUN 18 09/13/2022   NA 136 09/13/2022   K 3.8 09/13/2022   CL 102 09/13/2022   CO2 25 09/13/2022   Lab Results  Component Value Date   CHOL 173 12/07/2021   HDL 71 12/07/2021   LDLCALC 82 12/07/2021   TRIG 116 12/07/2021   CHOLHDL 2.4 12/07/2021    No results found for: "HGBA1C" Assessment & Plan    1.  Nonobstructive CAD: -Mild nonobstructive disease noted on LHC in 2001 with normal Myoview completed 2010  2.  Thoracic aneurysm: -Patient's last MRA of the chest completed 02/2022  showing stable aneurysm of 4.2 cm with no plans for repeat studies.  3.  Essential hypertension:  4.  Hyperlipidemia:      Disposition: Follow-up with Lance Muss, MD or APP in *** months {Are you ordering a CV Procedure (e.g. stress test, cath, DCCV, TEE, etc)?   Press F2        :[id number]}   Signed, Napoleon Form, Leodis Rains, NP 04/15/2023, 10:15 AM [facility]

## 2023-04-16 ENCOUNTER — Ambulatory Visit: Payer: Medicare Other | Admitting: Nurse Practitioner

## 2023-04-16 DIAGNOSIS — I25118 Atherosclerotic heart disease of native coronary artery with other forms of angina pectoris: Secondary | ICD-10-CM

## 2023-04-16 DIAGNOSIS — I10 Essential (primary) hypertension: Secondary | ICD-10-CM

## 2023-04-16 DIAGNOSIS — I712 Thoracic aortic aneurysm, without rupture, unspecified: Secondary | ICD-10-CM

## 2023-04-16 DIAGNOSIS — E782 Mixed hyperlipidemia: Secondary | ICD-10-CM

## 2023-04-20 ENCOUNTER — Other Ambulatory Visit: Payer: Self-pay | Admitting: Nurse Practitioner

## 2023-05-19 NOTE — Progress Notes (Signed)
 Cardiology Office Note    Patient Name: Tiffany Kane Date of Encounter: 05/21/2023  Primary Care Provider:  Dyane Anthony RAMAN, FNP Primary Cardiologist:  Candyce Reek, MD Primary Electrophysiologist: None   Past Medical History    Past Medical History:  Diagnosis Date   Anxiety    Arthritis    Breast cancer (HCC) 05/14/2000   DUCTAL IN SITU.SABRA1999   Bronchitis    Compression fracture    T-11   Coronary artery disease    Depression    Depression    situational stress   Esophageal reflux    H/O ascites 09/11/2004   High cholesterol    Hyperlipemia    Hyperlipidemia    Hypertension    Osteopenia    Osteoporosis 03/14/2006   Pneumonia    Thoracic aneurysm    DR. MALVA   Thoracic aortic aneurysm (HCC)    currently 4.4 cm   Thyromegaly     History of Present Illness  Tiffany Kane is a 83 y.o. female with a PMH of nonobstructive CAD by LHC in 2001, HTN, HLD, thoracic aneurysm, breast CA, interstitial lung disease, rheumatoid arthritis, carotid calcifications, thyroid  CA s/p right thyroid  lobectomy 09/2022 who presents today for annual follow-up.   Tiffany Kane has been followed by Dr. Reek since 2014 and was previously followed by Dr. Annis.  She underwent LHC in 2001 that showed nonobstructive coronary disease and completed a normal perfusion study in 2010.  She was diagnosed with thoracic aneurysm in 2011 with last CTA of the chest completed in 02/2022 showing stable ascending thoracic aorta of 4.2 cm.  She was last seen by Dr. Reek on 07/2021 and patient reported no complaints of chest pain with controlled cholesterol and BP.  Dr. Reek recommended no further studies at that time with recommendation to continue BP monitoring.  She underwent carotid ultrasound that showed multiple thyroid  lesions with biopsy showing risk of 50% malignancy. She underwent successful right thyroid  lobectomy on 09/21/2022.  She had pathology completed that showed that her  nodules were benign.   Tiffany Kane was seen today for annual follow-up.  She reports no significant issues since the last visit. The patient's aneurysm has remained stable, and no further surveillance CT scans are required. The patient's cholesterol is controlled, and blood pressure is within normal limits.  She underwent lobectomy on 09/21/2022 and is currently being followed by PCP.  She reports weight gain and thinning hair since the procedure, which are being monitored by a nurse practitioner. The patient acknowledges the need for medication adjustments to manage these symptoms.  She also mentions occasional palpitations, experienced during periods of relaxation. These episodes are brief, lasting no more than 30 seconds, and are not associated with any other symptoms such as shortness of breath.  Patient denies chest pain, palpitations, dyspnea, PND, orthopnea, nausea, vomiting, dizziness, syncope, edema, weight gain, or early satiety.   Review of Systems  Please see the history of present illness.    All other systems reviewed and are otherwise negative except as noted above.  Physical Exam    Wt Readings from Last 3 Encounters:  05/21/23 164 lb (74.4 kg)  11/21/22 161 lb 6.4 oz (73.2 kg)  09/21/22 159 lb (72.1 kg)   VS: Vitals:   05/21/23 1349  BP: 118/68  Pulse: 71  SpO2: 96%  ,Body mass index is 32.03 kg/m. GEN: Well nourished, well developed in no acute distress Neck: No JVD; No carotid bruits Pulmonary: Clear to auscultation without  rales, wheezing or rhonchi  Cardiovascular: Normal rate. Regular rhythm. Normal S1. Normal S2.   Murmurs: There is no murmur.  ABDOMEN: Soft, non-tender, non-distended EXTREMITIES:  No edema; No deformity   EKG/LABS/ Recent Cardiac Studies   ECG personally reviewed by me today -sinus rhythm with rate of 60 bpm and no acute changes consistent with previous EKG.  Risk Assessment/Calculations:          Lab Results  Component Value Date    WBC 6.6 09/13/2022   HGB 14.0 09/13/2022   HCT 41.8 09/13/2022   MCV 90.5 09/13/2022   PLT 204 09/13/2022   Lab Results  Component Value Date   CREATININE 0.83 09/13/2022   BUN 18 09/13/2022   NA 136 09/13/2022   K 3.8 09/13/2022   CL 102 09/13/2022   CO2 25 09/13/2022   Lab Results  Component Value Date   CHOL 173 12/07/2021   HDL 71 12/07/2021   LDLCALC 82 12/07/2021   TRIG 116 12/07/2021   CHOLHDL 2.4 12/07/2021    No results found for: HGBA1C Assessment & Plan    1.  Nonobstructive CAD: -Mild nonobstructive disease noted on LHC in 2001 with normal Myoview completed 2010 -Today patient reports no angina or chest pain since previous follow-up. -Continue current GDMT with ASA 81 mg, Toprol  50 mg daily, and Crestor  20 mg daily   2.  Thoracic aneurysm: -Patient's last MRA of the chest completed 02/2022 showing stable aneurysm of 4.2 cm with no plans for repeat studies. -Continue current management.   3.  Essential hypertension: -Patient's blood pressure today was stable at 118/68 -Continue Maxide 37.5-25 mg   4.  Hyperlipidemia: -LDL cholesterol slightly elevated at 90, but within acceptable range. Discussed dietary modifications to improve lipid profile. -Continue Crestor . -Encourage diet rich in unsaturated fats and regular exercise.     Disposition: Follow-up with Dr. Jeffrie or APP in 12 months    Signed, Wyn Raddle, Jackee Shove, NP 05/21/2023, 2:03 PM [facility]

## 2023-05-21 ENCOUNTER — Encounter: Payer: Self-pay | Admitting: Nurse Practitioner

## 2023-05-21 ENCOUNTER — Ambulatory Visit: Payer: Medicare Other | Attending: Nurse Practitioner | Admitting: Nurse Practitioner

## 2023-05-21 VITALS — BP 118/68 | HR 71 | Ht 60.0 in | Wt 164.0 lb

## 2023-05-21 DIAGNOSIS — E782 Mixed hyperlipidemia: Secondary | ICD-10-CM

## 2023-05-21 DIAGNOSIS — I10 Essential (primary) hypertension: Secondary | ICD-10-CM | POA: Diagnosis not present

## 2023-05-21 DIAGNOSIS — I25118 Atherosclerotic heart disease of native coronary artery with other forms of angina pectoris: Secondary | ICD-10-CM | POA: Diagnosis not present

## 2023-05-21 DIAGNOSIS — I712 Thoracic aortic aneurysm, without rupture, unspecified: Secondary | ICD-10-CM | POA: Diagnosis not present

## 2023-05-21 NOTE — Patient Instructions (Signed)
 Medication Instructions:   Your physician recommends that you continue on your current medications as directed. Please refer to the Current Medication list given to you today.   *If you need a refill on your cardiac medications before your next appointment, please call your pharmacy*   Lab Work:  None ordered.  If you have labs (blood work) drawn today and your tests are completely normal, you will receive your results only by: MyChart Message (if you have MyChart) OR A paper copy in the mail If you have any lab test that is abnormal or we need to change your treatment, we will call you to review the results.   Testing/Procedures:  None ordered.   Follow-Up: At [facility], you and your health needs are our priority.  As part of our continuing mission to provide you with exceptional heart care, we have created designated Provider Care Teams.  These Care Teams include your primary Cardiologist (physician) and Advanced Practice Providers (APPs -  Physician Assistants and Nurse Practitioners) who all work together to provide you with the care you need, when you need it.  We recommend signing up for the patient portal called MyChart.  Sign up information is provided on this After Visit Summary.  MyChart is used to connect with patients for Virtual Visits (Telemedicine).  Patients are able to view lab/test results, encounter notes, upcoming appointments, etc.  Non-urgent messages can be sent to your provider as well.   To learn more about what you can do with MyChart, go to [email or web address].    Your next appointment:   1 year(s)  Provider:   Oneil Parchment, MD    Other Instructions  Your physician wants you to follow-up in: 1 year with Dr. Parchment.  You will receive a reminder letter in the mail two months in advance. If you don't receive a letter, please call our office to schedule the follow-up appointment.  Adopting a Healthy Lifestyle.   Weight: Know what a  healthy weight is for you (roughly BMI <25) and aim to maintain this. You can calculate your body mass index on your smart phone. Unfortunately, this is not the most accurate measure of healthy weight, but it is the simplest measurement to use. A more accurate measurement involves body scanning which measures lean muscle, fat tissue and bony density. We do not have this equipment at [facility].    Diet: Aim for 7+ servings of fruits and vegetables daily Limit animal fats in diet for cholesterol and heart health - choose grass fed whenever available Avoid highly processed foods (fast food burgers, tacos, fried chicken, pizza, hot dogs, french fries)  Saturated fat comes in the form of butter, lard, coconut oil, margarine, partially hydrogenated oils, and fat in meat. These increase your risk of cardiovascular disease.  Use healthy plant oils, such as olive, canola, soy, corn, sunflower and peanut.  Whole foods such as fruits, vegetables and whole grains have fiber  Men need > 38 grams of fiber per day Women need > 25 grams of fiber per day  Load up on vegetables and fruits - one-half of your plate: Aim for color and variety, and remember that potatoes dont count. Go for whole grains - one-quarter of your plate: Whole wheat, barley, wheat berries, quinoa, oats, brown rice, and foods made with them. If you want pasta, go with whole wheat pasta. Protein power - one-quarter of your plate: Fish, chicken, beans, and nuts are all healthy, versatile protein sources. Limit red meat. You  need carbohydrates for energy! The type of carbohydrate is more important than the amount. Choose carbohydrates such as vegetables, fruits, whole grains, beans, and nuts in the place of white rice, white pasta, potatoes (baked or fried), macaroni and cheese, cakes, cookies, and donuts.  If youre thirsty, drink water. Coffee and tea are good in moderation, but skip sugary drinks and limit milk and dairy products to one or two  daily servings. Keep sugar intake at 6 teaspoons or 24 grams or LESS       Exercise: Aim for 150 min of moderate intensity exercise weekly for heart health, and weights twice weekly for bone health Stay active - any steps are better than no steps! Aim for 7-9 hours of sleep daily

## 2023-08-28 ENCOUNTER — Ambulatory Visit
Admission: RE | Admit: 2023-08-28 | Discharge: 2023-08-28 | Disposition: A | Discharge status: Home / Self Care | Source: Ambulatory Visit | Attending: Emergency Medicine | Admitting: Emergency Medicine

## 2023-08-28 DIAGNOSIS — K8020 Calculus of gallbladder without cholecystitis without obstruction: Secondary | ICD-10-CM | POA: Diagnosis not present

## 2023-08-28 DIAGNOSIS — J849 Interstitial pulmonary disease, unspecified: Secondary | ICD-10-CM

## 2023-08-28 DIAGNOSIS — J8410 Pulmonary fibrosis, unspecified: Secondary | ICD-10-CM | POA: Diagnosis not present

## 2023-08-28 DIAGNOSIS — K449 Diaphragmatic hernia without obstruction or gangrene: Secondary | ICD-10-CM | POA: Diagnosis not present

## 2023-08-28 DIAGNOSIS — I7121 Aneurysm of the ascending aorta, without rupture: Secondary | ICD-10-CM | POA: Diagnosis not present

## 2023-09-08 ENCOUNTER — Other Ambulatory Visit: Payer: Self-pay | Admitting: Nurse Practitioner

## 2023-10-14 ENCOUNTER — Ambulatory Visit: Payer: Self-pay | Admitting: Emergency Medicine

## 2023-11-28 DIAGNOSIS — M810 Age-related osteoporosis without current pathological fracture: Secondary | ICD-10-CM | POA: Diagnosis not present

## 2023-11-28 DIAGNOSIS — M06041 Rheumatoid arthritis without rheumatoid factor, right hand: Secondary | ICD-10-CM | POA: Diagnosis not present

## 2023-11-28 DIAGNOSIS — Z79899 Other long term (current) drug therapy: Secondary | ICD-10-CM | POA: Diagnosis not present

## 2023-12-24 DIAGNOSIS — M06041 Rheumatoid arthritis without rheumatoid factor, right hand: Secondary | ICD-10-CM | POA: Diagnosis not present

## 2024-01-29 ENCOUNTER — Ambulatory Visit: Admitting: Emergency Medicine

## 2024-02-19 DIAGNOSIS — M810 Age-related osteoporosis without current pathological fracture: Secondary | ICD-10-CM | POA: Diagnosis not present

## 2024-02-19 DIAGNOSIS — Z23 Encounter for immunization: Secondary | ICD-10-CM | POA: Diagnosis not present

## 2024-02-19 DIAGNOSIS — M069 Rheumatoid arthritis, unspecified: Secondary | ICD-10-CM | POA: Diagnosis not present

## 2024-02-19 DIAGNOSIS — I712 Thoracic aortic aneurysm, without rupture, unspecified: Secondary | ICD-10-CM | POA: Diagnosis not present

## 2024-02-19 DIAGNOSIS — E782 Mixed hyperlipidemia: Secondary | ICD-10-CM | POA: Diagnosis not present

## 2024-02-19 DIAGNOSIS — I2510 Atherosclerotic heart disease of native coronary artery without angina pectoris: Secondary | ICD-10-CM | POA: Diagnosis not present

## 2024-02-19 DIAGNOSIS — I10 Essential (primary) hypertension: Secondary | ICD-10-CM | POA: Diagnosis not present

## 2024-02-19 DIAGNOSIS — E559 Vitamin D deficiency, unspecified: Secondary | ICD-10-CM | POA: Diagnosis not present

## 2024-02-19 DIAGNOSIS — E890 Postprocedural hypothyroidism: Secondary | ICD-10-CM | POA: Diagnosis not present

## 2024-02-19 DIAGNOSIS — J849 Interstitial pulmonary disease, unspecified: Secondary | ICD-10-CM | POA: Diagnosis not present

## 2024-02-19 DIAGNOSIS — Z0000 Encounter for general adult medical examination without abnormal findings: Secondary | ICD-10-CM | POA: Diagnosis not present

## 2024-02-22 ENCOUNTER — Other Ambulatory Visit (HOSPITAL_BASED_OUTPATIENT_CLINIC_OR_DEPARTMENT_OTHER): Payer: Self-pay | Admitting: Family Medicine

## 2024-02-22 DIAGNOSIS — E2839 Other primary ovarian failure: Secondary | ICD-10-CM

## 2024-02-22 DIAGNOSIS — M810 Age-related osteoporosis without current pathological fracture: Secondary | ICD-10-CM

## 2024-04-16 ENCOUNTER — Telehealth: Payer: Self-pay | Admitting: Emergency Medicine

## 2024-04-16 NOTE — Telephone Encounter (Signed)
 New message    Phone just rings - sent mychart message   Hello,  This is  Pulmonary. Please give us  a call regarding your appointment on Friday, April 17, 2024.  Thank you!

## 2024-04-17 ENCOUNTER — Ambulatory Visit: Admitting: Emergency Medicine

## 2024-04-24 ENCOUNTER — Encounter: Payer: Self-pay | Admitting: Cardiology

## 2024-04-30 ENCOUNTER — Ambulatory Visit: Admitting: Emergency Medicine

## 2024-04-30 VITALS — BP 115/66 | HR 57 | Temp 98.3°F | Wt 152.6 lb

## 2024-04-30 DIAGNOSIS — J849 Interstitial pulmonary disease, unspecified: Secondary | ICD-10-CM | POA: Diagnosis not present

## 2024-04-30 DIAGNOSIS — R0602 Shortness of breath: Secondary | ICD-10-CM

## 2024-04-30 NOTE — Assessment & Plan Note (Signed)
 Progressive shortness of breath over the last year with exertion.  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  We will plan to repeat in April.  I will also get PFT before next visit given the change in her capacity and dyspnea.  Her last PFT were in 2016.  Depending on how she is doing, the results of her testing she may need ambulatory oximetry to screen for hypoxemia.

## 2024-04-30 NOTE — Progress Notes (Signed)
° °  Subjective:    Patient ID: Tiffany Kane, female    DOB: 07/14/1940, 83 y.o.   MRN: [id number]  HPI  ROV 04/30/2024 --83 year old woman with a history of RA, breast cancer, hypertension, prior cryptogenic organizing pneumonia, treated for pneumonitis in 2023, question related to methotrexate at the time.  She required a slow taper of corticosteroids.  She had a high-resolution CT scan of the chest 08/28/2023 that I have reviewed which was stable. She remains on hydroxychlorquine. She describes some dyspnea with carrying groceries, more noticeable than a year ago. She remains active. No real cough or sputum.   High-resolution CT scan of the chest done on 08/28/2023 reviewed by me showed stable parenchymal interstitial changes not in a UIP pattern with some traction bronchiectasis and bronchiolectasis, no honeycomb change, scattered small pulmonary nodules all less than 4 mm   Review of Systems As per HPI      Objective:   Physical Exam Vitals:   04/30/24 0854  BP: 115/66  Pulse: (!) 57  Temp: 98.3 F (36.8 C)  SpO2: 98%  Weight: 152 lb 9.6 oz (69.2 kg)   Gen: Pleasant, overweight elderly woman, well-nourished, in no distress,  normal affect  ENT: No lesions,  mouth clear,  oropharynx clear, no postnasal drip  Neck: No JVD, no stridor  Lungs: No use of accessory muscles, no crackles or wheezing on normal respiration, no wheeze on forced expiration  Cardiovascular: RRR, heart sounds normal, no murmur or gallops, no peripheral edema  Musculoskeletal: No deformities, no cyanosis or clubbing  Neuro: alert, awake, non focal  Skin: Warm, no lesions or rash      Assessment & Plan:   ILD (interstitial lung disease) (HCC) Progressive shortness of breath over the last year with exertion.  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  We will plan to repeat in April.  I will also get PFT before next visit given the change in her capacity and dyspnea.  Her last PFT were in 2016.  Depending on how she is doing, the results of her testing she may need ambulatory oximetry to screen for hypoxemia.  I personally spent a total of 23 minutes in the care of the patient today including preparing to see the patient, getting/reviewing separately obtained history, performing a medically appropriate exam/evaluation, counseling and educating, placing orders, documenting clinical information in the EHR, independently interpreting results, and communicating results.   Lamar Chris, MD, PhD 04/30/2024, 9:21 AM [facility] [phone] or if no answer before 7:00PM call [phone] For any issues after 7:00PM please call eLink [phone]

## 2024-04-30 NOTE — Patient Instructions (Signed)
 We reviewed your CT scan of the chest from April 2025 today. We will plan to repeat your scan in April 2026 We will arrange for pulmonary function testing Follow Dr. Shelah in April to review your testing.  Please call if you have any new respiratory problems or questions so we can see you sooner.

## 2024-06-12 ENCOUNTER — Encounter

## 2024-07-23 ENCOUNTER — Encounter

## 2024-09-02 ENCOUNTER — Ambulatory Visit: Admitting: Emergency Medicine
